# Patient Record
Sex: Male | Born: 1935 | Race: White | Hispanic: No | Marital: Married | Smoking: Never smoker
Health system: Southern US, Community
[De-identification: ages and names within clinical notes are randomized; demographics above are authoritative.]

## PROBLEM LIST (undated history)

## (undated) DIAGNOSIS — M549 Dorsalgia, unspecified: Secondary | ICD-10-CM

## (undated) DIAGNOSIS — I2510 Atherosclerotic heart disease of native coronary artery without angina pectoris: Secondary | ICD-10-CM

## (undated) DIAGNOSIS — N183 Chronic kidney disease, stage 3 unspecified: Secondary | ICD-10-CM

## (undated) DIAGNOSIS — R972 Elevated prostate specific antigen [PSA]: Secondary | ICD-10-CM

## (undated) DIAGNOSIS — E785 Hyperlipidemia, unspecified: Secondary | ICD-10-CM

## (undated) DIAGNOSIS — I10 Essential (primary) hypertension: Secondary | ICD-10-CM

## (undated) DIAGNOSIS — F419 Anxiety disorder, unspecified: Secondary | ICD-10-CM

## (undated) DIAGNOSIS — R3129 Other microscopic hematuria: Secondary | ICD-10-CM

## (undated) HISTORY — DX: Other microscopic hematuria: R31.29

## (undated) HISTORY — DX: Elevated prostate specific antigen (PSA): R97.20

## (undated) HISTORY — DX: Atherosclerotic heart disease of native coronary artery without angina pectoris: I25.10

## (undated) HISTORY — DX: Chronic kidney disease, stage 3 unspecified: N18.30

## (undated) HISTORY — PX: OTHER SURGICAL HISTORY: SHX169

## (undated) HISTORY — DX: Chronic kidney disease, stage 3 (moderate): N18.3

## (undated) HISTORY — DX: Hyperlipidemia, unspecified: E78.5

## (undated) HISTORY — DX: Essential (primary) hypertension: I10

---

## 1999-03-24 ENCOUNTER — Emergency Department (HOSPITAL_COMMUNITY): Admission: EM | Admit: 1999-03-24 | Discharge: 1999-03-24 | Payer: Self-pay | Admitting: Emergency Medicine

## 2010-08-05 ENCOUNTER — Ambulatory Visit: Payer: Self-pay | Admitting: Cardiology

## 2010-08-06 ENCOUNTER — Ambulatory Visit: Payer: Self-pay | Admitting: Cardiology

## 2010-12-10 ENCOUNTER — Ambulatory Visit: Payer: Self-pay | Admitting: Cardiology

## 2012-07-20 DIAGNOSIS — Z23 Encounter for immunization: Secondary | ICD-10-CM | POA: Diagnosis not present

## 2012-10-11 DIAGNOSIS — I2510 Atherosclerotic heart disease of native coronary artery without angina pectoris: Secondary | ICD-10-CM | POA: Diagnosis not present

## 2012-10-11 DIAGNOSIS — I10 Essential (primary) hypertension: Secondary | ICD-10-CM | POA: Diagnosis not present

## 2012-10-11 DIAGNOSIS — E785 Hyperlipidemia, unspecified: Secondary | ICD-10-CM | POA: Diagnosis not present

## 2012-10-11 DIAGNOSIS — R82998 Other abnormal findings in urine: Secondary | ICD-10-CM | POA: Diagnosis not present

## 2012-10-11 DIAGNOSIS — Z125 Encounter for screening for malignant neoplasm of prostate: Secondary | ICD-10-CM | POA: Diagnosis not present

## 2012-10-18 DIAGNOSIS — Z1212 Encounter for screening for malignant neoplasm of rectum: Secondary | ICD-10-CM | POA: Diagnosis not present

## 2012-10-18 DIAGNOSIS — I2510 Atherosclerotic heart disease of native coronary artery without angina pectoris: Secondary | ICD-10-CM | POA: Diagnosis not present

## 2012-10-18 DIAGNOSIS — E785 Hyperlipidemia, unspecified: Secondary | ICD-10-CM | POA: Diagnosis not present

## 2012-10-18 DIAGNOSIS — Z1331 Encounter for screening for depression: Secondary | ICD-10-CM | POA: Diagnosis not present

## 2012-10-18 DIAGNOSIS — Z0000 Encounter for general adult medical examination without abnormal findings: Secondary | ICD-10-CM | POA: Diagnosis not present

## 2012-10-18 DIAGNOSIS — I10 Essential (primary) hypertension: Secondary | ICD-10-CM | POA: Diagnosis not present

## 2012-10-21 DIAGNOSIS — D485 Neoplasm of uncertain behavior of skin: Secondary | ICD-10-CM | POA: Diagnosis not present

## 2012-10-21 DIAGNOSIS — D2330 Other benign neoplasm of skin of unspecified part of face: Secondary | ICD-10-CM | POA: Diagnosis not present

## 2012-10-21 DIAGNOSIS — L821 Other seborrheic keratosis: Secondary | ICD-10-CM | POA: Diagnosis not present

## 2012-10-21 DIAGNOSIS — L570 Actinic keratosis: Secondary | ICD-10-CM | POA: Diagnosis not present

## 2012-11-10 DIAGNOSIS — R972 Elevated prostate specific antigen [PSA]: Secondary | ICD-10-CM

## 2012-11-10 HISTORY — DX: Elevated prostate specific antigen (PSA): R97.20

## 2012-11-25 ENCOUNTER — Encounter: Payer: Self-pay | Admitting: Gastroenterology

## 2012-11-30 ENCOUNTER — Encounter: Payer: Self-pay | Admitting: Gastroenterology

## 2012-12-22 ENCOUNTER — Ambulatory Visit (AMBULATORY_SURGERY_CENTER): Payer: Medicare Other

## 2012-12-22 VITALS — Ht 67.5 in | Wt 160.0 lb

## 2012-12-22 DIAGNOSIS — Z1211 Encounter for screening for malignant neoplasm of colon: Secondary | ICD-10-CM

## 2012-12-22 MED ORDER — MOVIPREP 100 G PO SOLR: 1.0000 | Freq: Once | ORAL | Status: DC

## 2013-01-03 ENCOUNTER — Encounter: Payer: Self-pay | Admitting: Gastroenterology

## 2013-01-03 ENCOUNTER — Ambulatory Visit (AMBULATORY_SURGERY_CENTER): Payer: Medicare Other | Admitting: Gastroenterology

## 2013-01-03 VITALS — BP 124/61 | HR 57 | Temp 96.1°F | Resp 16 | Ht 67.0 in | Wt 160.0 lb

## 2013-01-03 DIAGNOSIS — Z1211 Encounter for screening for malignant neoplasm of colon: Secondary | ICD-10-CM

## 2013-01-03 DIAGNOSIS — E785 Hyperlipidemia, unspecified: Secondary | ICD-10-CM | POA: Diagnosis not present

## 2013-01-03 MED ORDER — SODIUM CHLORIDE 0.9 % IV SOLN: 500.0000 mL | INTRAVENOUS | Status: DC

## 2013-01-03 NOTE — Op Note (Signed)
[facility] [address] [place], [address]   COLONOSCOPY PROCEDURE REPORT  PATIENT: Dennis Carey, Dennis Carey  MR#: [id number] BIRTHDATE: February 25, 1935 , 77  yrs. old GENDER: Male ENDOSCOPIST: Mardella Layman, MD, [facility] REFERRED BY: PROCEDURE DATE:  01/03/2013 PROCEDURE:   Colonoscopy, screening ASA CLASS:   Class I INDICATIONS:Average risk patient for colon cancer. MEDICATIONS: propofol (Diprivan) 200mg  IV  DESCRIPTION OF PROCEDURE:   After the risks and benefits and of the procedure were explained, informed consent was obtained.  A digital rectal exam revealed no abnormalities of the rectum.    The LB [id number]  endoscope was introduced through the anus and advanced to the cecum, which was identified by both the appendix and ileocecal valve .  The quality of the prep was excellent, using MoviPrep .  The instrument was then slowly withdrawn as the colon was fully examined.     COLON FINDINGS: A normal appearing cecum, ileocecal valve, and appendiceal orifice were identified.  The ascending, hepatic flexure, transverse, splenic flexure, descending, sigmoid colon and rectum appeared unremarkable.  No polyps or cancers were seen. Retroflexed views revealed no abnormalities.     The scope was then withdrawn from the patient and the procedure completed.  COMPLICATIONS: There were no complications. ENDOSCOPIC IMPRESSION: Normal colon .Marland Kitchen..no polyps noted...  RECOMMENDATIONS: Given your age, you will not need another colonoscopy for colon cancer screening or polyp surveillance.  These types of tests usually stop around the age 18.   REPEAT EXAM:  NU:UVOZDG Eloise Harman, MD  _______________________________ eSigned:  Mardella Layman, MD, [facility] 01/03/2013 10:45 AM

## 2013-01-03 NOTE — Patient Instructions (Addendum)
YOU HAD AN ENDOSCOPIC PROCEDURE TODAY AT THE [facility]: Refer to the procedure report that was given to you for any specific questions about what was found during the examination.  If the procedure report does not answer your questions, please call your gastroenterologist to clarify.  If you requested that your care partner not be given the details of your procedure findings, then the procedure report has been included in a sealed envelope for you to review at your convenience later.  YOU SHOULD EXPECT: Some feelings of bloating in the abdomen. Passage of more gas than usual.  Walking can help get rid of the air that was put into your GI tract during the procedure and reduce the bloating. If you had a lower endoscopy (such as a colonoscopy or flexible sigmoidoscopy) you may notice spotting of blood in your stool or on the toilet paper. If you underwent a bowel prep for your procedure, then you may not have a normal bowel movement for a few days.  DIET: Your first meal following the procedure should be a light meal and then it is ok to progress to your normal diet.  A half-sandwich or bowl of soup is an example of a good first meal.  Heavy or fried foods are harder to digest and may make you feel nauseous or bloated.  Likewise meals heavy in dairy and vegetables can cause extra gas to form and this can also increase the bloating.  Drink plenty of fluids but you should avoid alcoholic beverages for 24 hours.  ACTIVITY: Your care partner should take you home directly after the procedure.  You should plan to take it easy, moving slowly for the rest of the day.  You can resume normal activity the day after the procedure however you should NOT DRIVE or use heavy machinery for 24 hours (because of the sedation medicines used during the test).    SYMPTOMS TO REPORT IMMEDIATELY: A gastroenterologist can be reached at any hour.  During normal business hours, 8:30 AM to 5:00 PM Monday through Friday,  call [phone].  After hours and on weekends, please call the GI answering service at [phone] who will take a message and have the physician on call contact you.   Following lower endoscopy (colonoscopy or flexible sigmoidoscopy):  Excessive amounts of blood in the stool  Significant tenderness or worsening of abdominal pains  Swelling of the abdomen that is new, acute  Fever of 100F or higher  Following upper endoscopy (EGD)  Vomiting of blood or coffee ground material  New chest pain or pain under the shoulder blades  Painful or persistently difficult swallowing  New shortness of breath  Fever of 100F or higher  Black, tarry-looking stools  FOLLOW UP: If any biopsies were taken you will be contacted by phone or by letter within the next 1-3 weeks.  Call your gastroenterologist if you have not heard about the biopsies in 3 weeks.  Our staff will call the home number listed on your records the next business day following your procedure to check on you and address any questions or concerns that you may have at that time regarding the information given to you following your procedure. This is a courtesy call and so if there is no answer at the home number and we have not heard from you through the emergency physician on call, we will assume that you have returned to your regular daily activities without incident.  SIGNATURES/CONFIDENTIALITY: You and/or your care   partner have signed paperwork which will be entered into your electronic medical record.  These signatures attest to the fact that that the information above on your After Visit Summary has been reviewed and is understood.  Full responsibility of the confidentiality of this discharge information lies with you and/or your care-partner.  

## 2013-01-03 NOTE — Progress Notes (Signed)
Patient did not experience any of the following events: a burn prior to discharge; a fall within the facility; wrong site/side/patient/procedure/implant event; or a hospital transfer or hospital admission upon discharge from the facility. (G8907) Patient did not have preoperative order for IV antibiotic SSI prophylaxis. (G8918)  

## 2013-01-04 ENCOUNTER — Telehealth: Payer: Self-pay | Admitting: *Deleted

## 2013-01-04 NOTE — Telephone Encounter (Signed)
  Follow up Call-  Call back number 01/03/2013  Post procedure Call Back phone  # [phone]  Permission to leave phone message Yes     Patient questions:  Do you have a fever, pain , or abdominal swelling? no Pain Score  0 *  Have you tolerated food without any problems? yes  Have you been able to return to your normal activities? yes  Do you have any questions about your discharge instructions: Diet   no Medications  no Follow up visit  no  Do you have questions or concerns about your Care? no  Actions: * If pain score is 4 or above: No action needed, pain <4.

## 2013-02-14 DIAGNOSIS — H5200 Hypermetropia, unspecified eye: Secondary | ICD-10-CM | POA: Diagnosis not present

## 2013-02-14 DIAGNOSIS — H524 Presbyopia: Secondary | ICD-10-CM | POA: Diagnosis not present

## 2013-02-14 DIAGNOSIS — H2510 Age-related nuclear cataract, unspecified eye: Secondary | ICD-10-CM | POA: Diagnosis not present

## 2013-04-28 DIAGNOSIS — L919 Hypertrophic disorder of the skin, unspecified: Secondary | ICD-10-CM | POA: Diagnosis not present

## 2013-04-28 DIAGNOSIS — L819 Disorder of pigmentation, unspecified: Secondary | ICD-10-CM | POA: Diagnosis not present

## 2013-04-28 DIAGNOSIS — L821 Other seborrheic keratosis: Secondary | ICD-10-CM | POA: Diagnosis not present

## 2013-04-28 DIAGNOSIS — L909 Atrophic disorder of skin, unspecified: Secondary | ICD-10-CM | POA: Diagnosis not present

## 2013-06-15 DIAGNOSIS — H43819 Vitreous degeneration, unspecified eye: Secondary | ICD-10-CM | POA: Diagnosis not present

## 2013-07-05 DIAGNOSIS — H43819 Vitreous degeneration, unspecified eye: Secondary | ICD-10-CM | POA: Diagnosis not present

## 2013-08-26 DIAGNOSIS — Z23 Encounter for immunization: Secondary | ICD-10-CM | POA: Diagnosis not present

## 2013-10-19 DIAGNOSIS — R7309 Other abnormal glucose: Secondary | ICD-10-CM | POA: Diagnosis not present

## 2013-10-19 DIAGNOSIS — Z125 Encounter for screening for malignant neoplasm of prostate: Secondary | ICD-10-CM | POA: Diagnosis not present

## 2013-10-19 DIAGNOSIS — I10 Essential (primary) hypertension: Secondary | ICD-10-CM | POA: Diagnosis not present

## 2013-10-19 DIAGNOSIS — E785 Hyperlipidemia, unspecified: Secondary | ICD-10-CM | POA: Diagnosis not present

## 2013-10-26 DIAGNOSIS — E785 Hyperlipidemia, unspecified: Secondary | ICD-10-CM | POA: Diagnosis not present

## 2013-10-26 DIAGNOSIS — Z1331 Encounter for screening for depression: Secondary | ICD-10-CM | POA: Diagnosis not present

## 2013-10-26 DIAGNOSIS — R972 Elevated prostate specific antigen [PSA]: Secondary | ICD-10-CM | POA: Diagnosis not present

## 2013-10-26 DIAGNOSIS — IMO0002 Reserved for concepts with insufficient information to code with codable children: Secondary | ICD-10-CM | POA: Diagnosis not present

## 2013-10-26 DIAGNOSIS — Z23 Encounter for immunization: Secondary | ICD-10-CM | POA: Diagnosis not present

## 2013-10-26 DIAGNOSIS — R7309 Other abnormal glucose: Secondary | ICD-10-CM | POA: Diagnosis not present

## 2013-10-26 DIAGNOSIS — I2510 Atherosclerotic heart disease of native coronary artery without angina pectoris: Secondary | ICD-10-CM | POA: Diagnosis not present

## 2014-04-26 DIAGNOSIS — T148 Other injury of unspecified body region: Secondary | ICD-10-CM | POA: Diagnosis not present

## 2014-04-26 DIAGNOSIS — L821 Other seborrheic keratosis: Secondary | ICD-10-CM | POA: Diagnosis not present

## 2014-04-26 DIAGNOSIS — L739 Follicular disorder, unspecified: Secondary | ICD-10-CM | POA: Diagnosis not present

## 2014-04-26 DIAGNOSIS — L570 Actinic keratosis: Secondary | ICD-10-CM | POA: Diagnosis not present

## 2014-04-26 DIAGNOSIS — D239 Other benign neoplasm of skin, unspecified: Secondary | ICD-10-CM | POA: Diagnosis not present

## 2014-06-02 DIAGNOSIS — J209 Acute bronchitis, unspecified: Secondary | ICD-10-CM | POA: Diagnosis not present

## 2014-06-02 DIAGNOSIS — N183 Chronic kidney disease, stage 3 unspecified: Secondary | ICD-10-CM | POA: Diagnosis not present

## 2014-06-02 DIAGNOSIS — R059 Cough, unspecified: Secondary | ICD-10-CM | POA: Diagnosis not present

## 2014-06-02 DIAGNOSIS — J0100 Acute maxillary sinusitis, unspecified: Secondary | ICD-10-CM | POA: Diagnosis not present

## 2014-06-02 DIAGNOSIS — R05 Cough: Secondary | ICD-10-CM | POA: Diagnosis not present

## 2014-06-02 DIAGNOSIS — IMO0002 Reserved for concepts with insufficient information to code with codable children: Secondary | ICD-10-CM | POA: Diagnosis not present

## 2014-06-02 DIAGNOSIS — I10 Essential (primary) hypertension: Secondary | ICD-10-CM | POA: Diagnosis not present

## 2014-07-21 DIAGNOSIS — H2510 Age-related nuclear cataract, unspecified eye: Secondary | ICD-10-CM | POA: Diagnosis not present

## 2014-07-21 DIAGNOSIS — H5200 Hypermetropia, unspecified eye: Secondary | ICD-10-CM | POA: Diagnosis not present

## 2014-07-31 DIAGNOSIS — Z23 Encounter for immunization: Secondary | ICD-10-CM | POA: Diagnosis not present

## 2014-11-08 DIAGNOSIS — E785 Hyperlipidemia, unspecified: Secondary | ICD-10-CM | POA: Diagnosis not present

## 2014-11-08 DIAGNOSIS — Z008 Encounter for other general examination: Secondary | ICD-10-CM | POA: Diagnosis not present

## 2014-11-08 DIAGNOSIS — I10 Essential (primary) hypertension: Secondary | ICD-10-CM | POA: Diagnosis not present

## 2014-11-08 DIAGNOSIS — Z125 Encounter for screening for malignant neoplasm of prostate: Secondary | ICD-10-CM | POA: Diagnosis not present

## 2014-11-15 DIAGNOSIS — Z0000 Encounter for general adult medical examination without abnormal findings: Secondary | ICD-10-CM | POA: Diagnosis not present

## 2014-11-15 DIAGNOSIS — Z6824 Body mass index (BMI) 24.0-24.9, adult: Secondary | ICD-10-CM | POA: Diagnosis not present

## 2014-11-15 DIAGNOSIS — R7301 Impaired fasting glucose: Secondary | ICD-10-CM | POA: Diagnosis not present

## 2014-11-15 DIAGNOSIS — I10 Essential (primary) hypertension: Secondary | ICD-10-CM | POA: Diagnosis not present

## 2014-11-15 DIAGNOSIS — N183 Chronic kidney disease, stage 3 (moderate): Secondary | ICD-10-CM | POA: Diagnosis not present

## 2014-11-15 DIAGNOSIS — E785 Hyperlipidemia, unspecified: Secondary | ICD-10-CM | POA: Diagnosis not present

## 2014-11-15 DIAGNOSIS — R972 Elevated prostate specific antigen [PSA]: Secondary | ICD-10-CM | POA: Diagnosis not present

## 2014-11-15 DIAGNOSIS — Z79899 Other long term (current) drug therapy: Secondary | ICD-10-CM | POA: Diagnosis not present

## 2014-11-15 DIAGNOSIS — Z1389 Encounter for screening for other disorder: Secondary | ICD-10-CM | POA: Diagnosis not present

## 2014-11-15 DIAGNOSIS — I2510 Atherosclerotic heart disease of native coronary artery without angina pectoris: Secondary | ICD-10-CM | POA: Diagnosis not present

## 2014-11-15 DIAGNOSIS — Z23 Encounter for immunization: Secondary | ICD-10-CM | POA: Diagnosis not present

## 2014-11-16 DIAGNOSIS — Z1212 Encounter for screening for malignant neoplasm of rectum: Secondary | ICD-10-CM | POA: Diagnosis not present

## 2015-05-01 DIAGNOSIS — D2262 Melanocytic nevi of left upper limb, including shoulder: Secondary | ICD-10-CM | POA: Diagnosis not present

## 2015-05-01 DIAGNOSIS — L570 Actinic keratosis: Secondary | ICD-10-CM | POA: Diagnosis not present

## 2015-05-01 DIAGNOSIS — L820 Inflamed seborrheic keratosis: Secondary | ICD-10-CM | POA: Diagnosis not present

## 2015-05-01 DIAGNOSIS — D2261 Melanocytic nevi of right upper limb, including shoulder: Secondary | ICD-10-CM | POA: Diagnosis not present

## 2015-05-01 DIAGNOSIS — L821 Other seborrheic keratosis: Secondary | ICD-10-CM | POA: Diagnosis not present

## 2015-05-01 DIAGNOSIS — D225 Melanocytic nevi of trunk: Secondary | ICD-10-CM | POA: Diagnosis not present

## 2015-07-11 DIAGNOSIS — Z23 Encounter for immunization: Secondary | ICD-10-CM | POA: Diagnosis not present

## 2015-07-27 DIAGNOSIS — H2513 Age-related nuclear cataract, bilateral: Secondary | ICD-10-CM | POA: Diagnosis not present

## 2015-07-27 DIAGNOSIS — H5203 Hypermetropia, bilateral: Secondary | ICD-10-CM | POA: Diagnosis not present

## 2015-12-24 DIAGNOSIS — Z125 Encounter for screening for malignant neoplasm of prostate: Secondary | ICD-10-CM | POA: Diagnosis not present

## 2015-12-24 DIAGNOSIS — E784 Other hyperlipidemia: Secondary | ICD-10-CM | POA: Diagnosis not present

## 2015-12-24 DIAGNOSIS — N183 Chronic kidney disease, stage 3 (moderate): Secondary | ICD-10-CM | POA: Diagnosis not present

## 2015-12-24 DIAGNOSIS — R7301 Impaired fasting glucose: Secondary | ICD-10-CM | POA: Diagnosis not present

## 2015-12-31 DIAGNOSIS — R972 Elevated prostate specific antigen [PSA]: Secondary | ICD-10-CM | POA: Diagnosis not present

## 2015-12-31 DIAGNOSIS — N183 Chronic kidney disease, stage 3 (moderate): Secondary | ICD-10-CM | POA: Diagnosis not present

## 2015-12-31 DIAGNOSIS — Z0000 Encounter for general adult medical examination without abnormal findings: Secondary | ICD-10-CM | POA: Diagnosis not present

## 2015-12-31 DIAGNOSIS — Z1389 Encounter for screening for other disorder: Secondary | ICD-10-CM | POA: Diagnosis not present

## 2015-12-31 DIAGNOSIS — E784 Other hyperlipidemia: Secondary | ICD-10-CM | POA: Diagnosis not present

## 2015-12-31 DIAGNOSIS — R7301 Impaired fasting glucose: Secondary | ICD-10-CM | POA: Diagnosis not present

## 2015-12-31 DIAGNOSIS — I10 Essential (primary) hypertension: Secondary | ICD-10-CM | POA: Diagnosis not present

## 2015-12-31 DIAGNOSIS — Z6824 Body mass index (BMI) 24.0-24.9, adult: Secondary | ICD-10-CM | POA: Diagnosis not present

## 2015-12-31 DIAGNOSIS — I2510 Atherosclerotic heart disease of native coronary artery without angina pectoris: Secondary | ICD-10-CM | POA: Diagnosis not present

## 2016-07-07 DIAGNOSIS — L218 Other seborrheic dermatitis: Secondary | ICD-10-CM | POA: Diagnosis not present

## 2016-07-07 DIAGNOSIS — L570 Actinic keratosis: Secondary | ICD-10-CM | POA: Diagnosis not present

## 2016-07-07 DIAGNOSIS — D2262 Melanocytic nevi of left upper limb, including shoulder: Secondary | ICD-10-CM | POA: Diagnosis not present

## 2016-07-07 DIAGNOSIS — D2261 Melanocytic nevi of right upper limb, including shoulder: Secondary | ICD-10-CM | POA: Diagnosis not present

## 2016-07-07 DIAGNOSIS — L821 Other seborrheic keratosis: Secondary | ICD-10-CM | POA: Diagnosis not present

## 2016-07-26 DIAGNOSIS — Z23 Encounter for immunization: Secondary | ICD-10-CM | POA: Diagnosis not present

## 2016-07-29 DIAGNOSIS — H5203 Hypermetropia, bilateral: Secondary | ICD-10-CM | POA: Diagnosis not present

## 2016-07-29 DIAGNOSIS — H0012 Chalazion right lower eyelid: Secondary | ICD-10-CM | POA: Diagnosis not present

## 2016-07-29 DIAGNOSIS — H2513 Age-related nuclear cataract, bilateral: Secondary | ICD-10-CM | POA: Diagnosis not present

## 2017-01-01 DIAGNOSIS — Z0000 Encounter for general adult medical examination without abnormal findings: Secondary | ICD-10-CM | POA: Diagnosis not present

## 2017-01-01 DIAGNOSIS — Z125 Encounter for screening for malignant neoplasm of prostate: Secondary | ICD-10-CM | POA: Diagnosis not present

## 2017-01-01 DIAGNOSIS — E784 Other hyperlipidemia: Secondary | ICD-10-CM | POA: Diagnosis not present

## 2017-01-01 DIAGNOSIS — R7301 Impaired fasting glucose: Secondary | ICD-10-CM | POA: Diagnosis not present

## 2017-01-01 DIAGNOSIS — N183 Chronic kidney disease, stage 3 (moderate): Secondary | ICD-10-CM | POA: Diagnosis not present

## 2017-01-08 DIAGNOSIS — I10 Essential (primary) hypertension: Secondary | ICD-10-CM | POA: Diagnosis not present

## 2017-01-08 DIAGNOSIS — E784 Other hyperlipidemia: Secondary | ICD-10-CM | POA: Diagnosis not present

## 2017-01-08 DIAGNOSIS — R7301 Impaired fasting glucose: Secondary | ICD-10-CM | POA: Diagnosis not present

## 2017-01-08 DIAGNOSIS — N183 Chronic kidney disease, stage 3 (moderate): Secondary | ICD-10-CM | POA: Diagnosis not present

## 2017-01-08 DIAGNOSIS — I2510 Atherosclerotic heart disease of native coronary artery without angina pectoris: Secondary | ICD-10-CM | POA: Diagnosis not present

## 2017-01-08 DIAGNOSIS — R972 Elevated prostate specific antigen [PSA]: Secondary | ICD-10-CM | POA: Diagnosis not present

## 2017-01-08 DIAGNOSIS — Z6824 Body mass index (BMI) 24.0-24.9, adult: Secondary | ICD-10-CM | POA: Diagnosis not present

## 2017-01-08 DIAGNOSIS — Z1389 Encounter for screening for other disorder: Secondary | ICD-10-CM | POA: Diagnosis not present

## 2017-01-08 DIAGNOSIS — Z0000 Encounter for general adult medical examination without abnormal findings: Secondary | ICD-10-CM | POA: Diagnosis not present

## 2017-08-17 DIAGNOSIS — H2513 Age-related nuclear cataract, bilateral: Secondary | ICD-10-CM | POA: Diagnosis not present

## 2017-08-17 DIAGNOSIS — H5203 Hypermetropia, bilateral: Secondary | ICD-10-CM | POA: Diagnosis not present

## 2017-09-08 DIAGNOSIS — L853 Xerosis cutis: Secondary | ICD-10-CM | POA: Diagnosis not present

## 2017-09-08 DIAGNOSIS — L821 Other seborrheic keratosis: Secondary | ICD-10-CM | POA: Diagnosis not present

## 2017-09-08 DIAGNOSIS — D2261 Melanocytic nevi of right upper limb, including shoulder: Secondary | ICD-10-CM | POA: Diagnosis not present

## 2017-09-08 DIAGNOSIS — L814 Other melanin hyperpigmentation: Secondary | ICD-10-CM | POA: Diagnosis not present

## 2017-09-08 DIAGNOSIS — D1801 Hemangioma of skin and subcutaneous tissue: Secondary | ICD-10-CM | POA: Diagnosis not present

## 2017-09-16 DIAGNOSIS — Z23 Encounter for immunization: Secondary | ICD-10-CM | POA: Diagnosis not present

## 2018-01-04 DIAGNOSIS — I10 Essential (primary) hypertension: Secondary | ICD-10-CM | POA: Diagnosis not present

## 2018-01-04 DIAGNOSIS — R82998 Other abnormal findings in urine: Secondary | ICD-10-CM | POA: Diagnosis not present

## 2018-01-04 DIAGNOSIS — R7301 Impaired fasting glucose: Secondary | ICD-10-CM | POA: Diagnosis not present

## 2018-01-04 DIAGNOSIS — Z125 Encounter for screening for malignant neoplasm of prostate: Secondary | ICD-10-CM | POA: Diagnosis not present

## 2018-01-04 DIAGNOSIS — E7849 Other hyperlipidemia: Secondary | ICD-10-CM | POA: Diagnosis not present

## 2018-01-11 DIAGNOSIS — E7849 Other hyperlipidemia: Secondary | ICD-10-CM | POA: Diagnosis not present

## 2018-01-11 DIAGNOSIS — R7301 Impaired fasting glucose: Secondary | ICD-10-CM | POA: Diagnosis not present

## 2018-01-11 DIAGNOSIS — Z1389 Encounter for screening for other disorder: Secondary | ICD-10-CM | POA: Diagnosis not present

## 2018-01-11 DIAGNOSIS — N183 Chronic kidney disease, stage 3 (moderate): Secondary | ICD-10-CM | POA: Diagnosis not present

## 2018-01-11 DIAGNOSIS — I2510 Atherosclerotic heart disease of native coronary artery without angina pectoris: Secondary | ICD-10-CM | POA: Diagnosis not present

## 2018-01-11 DIAGNOSIS — Z6824 Body mass index (BMI) 24.0-24.9, adult: Secondary | ICD-10-CM | POA: Diagnosis not present

## 2018-01-11 DIAGNOSIS — I10 Essential (primary) hypertension: Secondary | ICD-10-CM | POA: Diagnosis not present

## 2018-01-11 DIAGNOSIS — Z0000 Encounter for general adult medical examination without abnormal findings: Secondary | ICD-10-CM | POA: Diagnosis not present

## 2018-01-11 DIAGNOSIS — R972 Elevated prostate specific antigen [PSA]: Secondary | ICD-10-CM | POA: Diagnosis not present

## 2018-01-13 DIAGNOSIS — Z1212 Encounter for screening for malignant neoplasm of rectum: Secondary | ICD-10-CM | POA: Diagnosis not present

## 2018-05-17 DIAGNOSIS — Z6825 Body mass index (BMI) 25.0-25.9, adult: Secondary | ICD-10-CM | POA: Diagnosis not present

## 2018-05-17 DIAGNOSIS — M542 Cervicalgia: Secondary | ICD-10-CM | POA: Diagnosis not present

## 2018-05-17 DIAGNOSIS — I2510 Atherosclerotic heart disease of native coronary artery without angina pectoris: Secondary | ICD-10-CM | POA: Diagnosis not present

## 2018-05-17 DIAGNOSIS — I10 Essential (primary) hypertension: Secondary | ICD-10-CM | POA: Diagnosis not present

## 2018-05-25 ENCOUNTER — Other Ambulatory Visit: Payer: Self-pay | Admitting: Internal Medicine

## 2018-05-25 ENCOUNTER — Ambulatory Visit
Admission: RE | Admit: 2018-05-25 | Discharge: 2018-05-25 | Disposition: A | Discharge status: Home / Self Care | Payer: Medicare Other | Source: Ambulatory Visit | Attending: Internal Medicine | Admitting: Internal Medicine

## 2018-05-25 DIAGNOSIS — R519 Headache, unspecified: Secondary | ICD-10-CM

## 2018-05-25 DIAGNOSIS — R51 Headache: Principal | ICD-10-CM

## 2018-07-16 DIAGNOSIS — N183 Chronic kidney disease, stage 3 (moderate): Secondary | ICD-10-CM | POA: Diagnosis not present

## 2018-07-16 DIAGNOSIS — Z6824 Body mass index (BMI) 24.0-24.9, adult: Secondary | ICD-10-CM | POA: Diagnosis not present

## 2018-07-16 DIAGNOSIS — I10 Essential (primary) hypertension: Secondary | ICD-10-CM | POA: Diagnosis not present

## 2018-07-16 DIAGNOSIS — R51 Headache: Secondary | ICD-10-CM | POA: Diagnosis not present

## 2018-07-17 DIAGNOSIS — Z23 Encounter for immunization: Secondary | ICD-10-CM | POA: Diagnosis not present

## 2018-07-19 ENCOUNTER — Other Ambulatory Visit: Payer: Self-pay | Admitting: Registered Nurse

## 2018-07-19 DIAGNOSIS — R519 Headache, unspecified: Secondary | ICD-10-CM

## 2018-07-19 DIAGNOSIS — R51 Headache: Principal | ICD-10-CM

## 2018-07-27 ENCOUNTER — Ambulatory Visit
Admission: RE | Admit: 2018-07-27 | Discharge: 2018-07-27 | Disposition: A | Discharge status: Home / Self Care | Payer: Medicare Other | Source: Ambulatory Visit | Attending: Registered Nurse | Admitting: Registered Nurse

## 2018-07-27 DIAGNOSIS — R51 Headache: Secondary | ICD-10-CM | POA: Diagnosis not present

## 2018-07-27 DIAGNOSIS — R519 Headache, unspecified: Secondary | ICD-10-CM

## 2018-07-27 MED ORDER — GADOBENATE DIMEGLUMINE 529 MG/ML IV SOLN
7.0000 mL | Freq: Once | INTRAVENOUS | Status: AC | PRN
  Administered 2018-07-27: 7 mL via INTRAVENOUS

## 2018-08-23 DIAGNOSIS — H2513 Age-related nuclear cataract, bilateral: Secondary | ICD-10-CM | POA: Diagnosis not present

## 2018-08-23 DIAGNOSIS — H5203 Hypermetropia, bilateral: Secondary | ICD-10-CM | POA: Diagnosis not present

## 2018-09-08 ENCOUNTER — Encounter: Payer: Self-pay | Admitting: Neurology

## 2018-09-08 ENCOUNTER — Ambulatory Visit (INDEPENDENT_AMBULATORY_CARE_PROVIDER_SITE_OTHER): Payer: Medicare Other | Admitting: Neurology

## 2018-09-08 ENCOUNTER — Encounter

## 2018-09-08 VITALS — BP 152/74 | HR 74 | Ht 67.0 in | Wt 155.0 lb

## 2018-09-08 DIAGNOSIS — G8929 Other chronic pain: Secondary | ICD-10-CM | POA: Diagnosis not present

## 2018-09-08 DIAGNOSIS — M542 Cervicalgia: Secondary | ICD-10-CM

## 2018-09-08 DIAGNOSIS — M5412 Radiculopathy, cervical region: Secondary | ICD-10-CM

## 2018-09-08 DIAGNOSIS — M4722 Other spondylosis with radiculopathy, cervical region: Secondary | ICD-10-CM

## 2018-09-08 DIAGNOSIS — M5481 Occipital neuralgia: Secondary | ICD-10-CM

## 2018-09-08 NOTE — Progress Notes (Signed)
Nerve block w/o steroid: Pt signed consent  0.5% Bupivocaine 6 mL [id number] EXP:09/22 [id number]  2% Lidocaine 6 mL LOT: [id number] EXP: 02/0/2021 [id number]

## 2018-09-08 NOTE — Progress Notes (Signed)
[facility]    Provider:  Dr Jaynee Eagles Referring Provider: [name], MD Primary Care Physician:  [name], MD  CC:  Posterior head pain  HPI:  Dennis Carey is a 83 y.o. male here as requested by Dr. Philip Aspen for headache.PMHx HLD, HTN, CKD, CAD, Cervical spine degenerative changes  In July he was on a big tractor, it was a rough area, he got "jostled" and did it several times in a row and noticed pain on the left side of the head in the back (points to the occipital nerve on the left). 8/10, steady pain, one spot, he was taking 2 advil and a tylenol which didn't help. Ice pack helped. No rash. Steroids were started. Vicadin helped but he had side effects. Steroids did not help. Then he had a dull aching pain and scalp tenderness in the back of the head. Headache never went away. MRI showed degenerative changes at c3-c4. Comes and goes but daily. He doesn't take medication for it. No migrainous symptoms.   Reviewed notes, labs and imaging from outside physicians, which showed:  Reviewed MRI of the brain results the patient brings today on CD also online from [facility] imaging.  Really unremarkable brain other than mild generalized atrophy and some very mild small vessel chronic ischemic changes normal for aging.  Normal aging brain.  Also normal intracranial MRA.  Reviewed referring notes from Dr. Brigitte Pulse.  He has had headaches since July 3.  Pain was localized posteriorly after being on a tractor for 3 hours.  Symptoms are described as constant generalized with scalp tenderness.  He is never experienced headaches before.  Posterior headache lasted for 2 weeks and resolved now left with mild generalized headache.  He is tried Advil Tylenol heating pads.  Has not taken anything for the headaches in 4 weeks.  He walks with mild C-spine kyphosis.  Otherwise his exam is normal.  Cranial abnormality versus neck pain.  Low suspicion for acute neuro cause.  Neuro exam  completely benign.  MRI/MRA unremarkable.  He declines medications.  Review of Systems: Patient complains of symptoms per HPI as well as the following symptoms: headache. Pertinent negatives and positives per HPI. All others negative.   Social History   Socioeconomic History  . Marital status: Married    Spouse name: Not on file  . Number of children: 3  . Years of education: MD   . Highest education level: Professional school degree (e.g., MD, DDS, DVM, JD)  Occupational History  . Occupation: retired Psychologist, sport and exercise  Social Needs  . Financial resource strain: Not on file  . Food insecurity:    Worry: Not on file    Inability: Not on file  . Transportation needs:    Medical: Not on file    Non-medical: Not on file  Tobacco Use  . Smoking status: Never Smoker  . Smokeless tobacco: Never Used  Substance and Sexual Activity  . Alcohol use: Yes    Comment: occasional wine   . Drug use: Never  . Sexual activity: Not on file  Lifestyle  . Physical activity:    Days per week: Not on file    Minutes per session: Not on file  . Stress: Not on file  Relationships  . Social connections:    Talks on phone: Not on file    Gets together: Not on file    Attends religious service: Not on file    Active member of club or organization: Not on file  Attends meetings of clubs or organizations: Not on file    Relationship status: Not on file  . Intimate partner violence:    Fear of current or ex partner: Not on file    Emotionally abused: Not on file    Physically abused: Not on file    Forced sexual activity: Not on file  Other Topics Concern  . Not on file  Social History Narrative   Lives at home with his wife Flora    Right handed   Caffeine: daily soda     Family History  Problem Relation Age of Onset  . Colon cancer Neg Hx   . Migraines Neg Hx     Past Medical History:  Diagnosis Date  . CAD (coronary artery disease)    significant CAD by cardiac calcium scoring test   . CKD (chronic kidney disease), stage III ([place])    by serum creatinine is stable  . HTN (hypertension)    pt denies   . Hyperlipidemia   . Microhematuria   . PSA elevation 2014   4.75 February 2017, 6.9 in 2/18 and declines urologist eval or trial of antibiotics    Past Surgical History:  Procedure Laterality Date  . dental implants     3 of them 2008  . submucous resection nose     1956    Current Outpatient Medications  Medication Sig Dispense Refill  . aspirin 81 MG tablet Take 81 mg by mouth daily.    Marland Kitchen ezetimibe (ZETIA) 10 MG tablet Take 10 mg by mouth daily.    . rosuvastatin (CRESTOR) 20 MG tablet Take 20 mg by mouth daily.     No current facility-administered medications for this visit.     Allergies as of 09/08/2018 - Review Complete 09/08/2018  Allergen Reaction Noted  . Sulfa antibiotics Hives 12/22/2012    Vitals: BP (!) 152/74 (BP Location: Left Arm, Patient Position: Sitting)   Pulse 74   Ht 5\' 7"  (1.702 m)   Wt 155 lb (70.3 kg)   BMI 24.28 kg/m  Last Weight:  Wt Readings from Last 1 Encounters:  09/08/18 155 lb (70.3 kg)   Last Height:   Ht Readings from Last 1 Encounters:  09/08/18 5\' 7"  (1.702 m)   Physical exam: Exam: Gen: NAD, conversant, well nourised, well groomed                     CV: RRR, no MRG. No Carotid Bruits. No peripheral edema, warm, nontender Eyes: Conjunctivae clear without exudates or hemorrhage  Neuro: Detailed Neurologic Exam  Speech:    Speech is normal; fluent and spontaneous with normal comprehension.  Cognition:    The patient is oriented to person, place, and time;     recent and remote memory intact;     language fluent;     normal attention, concentration,     fund of knowledge Cranial Nerves:    The pupils are equal, round, and reactive to light. The fundi are normal and spontaneous venous pulsations are present. Visual fields are full to finger confrontation. Extraocular movements are intact. Trigeminal  sensation is intact and the muscles of mastication are normal. The face is symmetric. The palate elevates in the midline. Hearing intact. Voice is normal. Shoulder shrug is normal. The tongue has normal motion without fasciculations.   Coordination:    Normal finger to nose and heel to shin. Normal rapid alternating movements.   Gait:    Heel-toe and tandem  gait are normal.   Motor Observation:    No asymmetry, no atrophy, and no involuntary movements noted. Tone:    Normal muscle tone.    Posture:    Posture is normal. normal erect    Strength:    Strength is V/V in the upper and lower limbs.      Sensation: intact to LT     Reflex Exam:  DTR's:    Deep tendon reflexes in the upper and lower extremities are normal bilaterally.   Toes:    The toes are downgoing bilaterally.   Clonus:    Clonus is absent.       Assessment/Plan:  Dennis Carey is a 83 y.o. male here as requested by Dr. Philip Aspen for headache.PMHx HLD, HTN, CKD, CAD, Cervical spine degenerative changes. In July he was on a big tractor, it was a rough area, he got "jostled" several days in a row and noticed pain on the left side of the head in the back (points to the occipital nerve on the left). This has progressed to a generalized headache. 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   Cervico-occipital neuralgia: Performed nerve blocks today, pain reduced from 4/10 to 0/10 Discussed if pain returns we can repeat nerve blocks or send him to Dr. Laurence Spates for Ablative procedures to denervate the occipital nerve (greater or lesser), upper cervical nerve (e.g. second cervical nerve also known as C2). MRI of the cervical spine to evaluate for degenerative changes, nerve root impingement to plan for ESI. Ablative procedures or other interventions.  Orders Placed This Encounter  Procedures  . MR CERVICAL SPINE WO CONTRAST   Performed by Dr. Jaynee Eagles M.D. All procedures a documented blood were medically necessary,  reasonable and appropriate based on the patient's history, medical diagnosis and physician opinion. Verbal informed consent was obtained from the patient, patient was informed of potential risk of procedure, including bruising, bleeding, hematoma formation, infection, muscle weakness, muscle pain, numbness, transient hypertension, transient hyperglycemia and transient insomnia among others. All areas injected were topically clean with isopropyl rubbing alcohol. Nonsterile nonlatex gloves were worn during the procedure.  1. Greater occipital nerve block [phone]). The greater occipital nerve site was identified at the nuchal line medial to the occipital artery. Medication was injected into the left and right occipital nerve areas and suboccipital areas. Patient's condition is associated with inflammation of the greater occipital nerve and associated multiple groups. Injection was deemed medically necessary, reasonable and appropriate. Injection represents a separate and unique surgical service.  2. Lesser occipital nerve block [phone]). The lesser occipital nerve site was identified approximately 2 cm lateral to the greater occipital nerve. Occasion was injected into the left and right occipital nerve areas. Patient's condition is associated with inflammation of the lesser occipital nerve and associated muscle groups. Injection was deemed medically necessary, reasonable and appropriate. Injection represents a separate and unique surgical service.   3. Auriculotemporal nerve block ([id number]): The Auriculotemporal nerve site was identified along the posterior margin of the sternocleidomastoid muscle toward the base of the ear. Medication was injected into the left and right radicular temporal nerve areas. Patient's condition is associated with inflammation of the Auriculotemporal Nerve and associated muscle groups. Injection was deemed medically necessary, reasonable and appropriate. Injection represents a separate  and unique surgical service.   Discussed: To prevent or relieve headaches, try the following: Cool Compress. Lie down and place a cool compress on your head.  Avoid headache triggers. If certain foods or odors seem to have triggered  your migraines in the past, avoid them. A headache diary might help you identify triggers.  Include physical activity in your daily routine. Try a daily walk or other moderate aerobic exercise.  Manage stress. Find healthy ways to cope with the stressors, such as delegating tasks on your to-do list.  Practice relaxation techniques. Try deep breathing, yoga, massage and visualization.  Eat regularly. Eating regularly scheduled meals and maintaining a healthy diet might help prevent headaches. Also, drink plenty of fluids.  Follow a regular sleep schedule. Sleep deprivation might contribute to headaches Consider biofeedback. With this mind-body technique, you learn to control certain bodily functions - such as muscle tension, heart rate and blood pressure - to prevent headaches or reduce headache pain.    Proceed to emergency room if you experience new or worsening symptoms or symptoms do not resolve, if you have new neurologic symptoms or if headache is severe, or for any concerning symptom.   Sarina Ill, MD  [facility] [address] [place], [place] [address]  Phone [phone] Fax [phone]

## 2018-09-08 NOTE — Patient Instructions (Signed)
Differential includes pain in the upper cervical joints or disks, suboccipital or upper posterior neck muscles including the traps/scm, spinal and posterior cranial fossa dura mater. Ablative procedures may be performed in attempt to denervate the occipital nerve (greater or lesser), upper cervical nerve (e.g. second cervical nerve also known as C2). The proposed goal of denervation is to "shut off" the pain signals that are sent to the brain from the joints and nerves. An additional purported objective is to reduce the likelihood of, or to delay, any recurrence that may occur by selectively destroying pain fibers without causing excessive sensory loss, motor dysfunction or other complications. Ablative treatments for occipital neuralgia, chronic headaches (including but not limited to cervicogenic headaches, migraines, cluster headaches, tension headaches) and persistent idiopathic facial pain (atypical facial pain) have been proposed for pain relief.  Occipital Nerve Block Patient Information  Description: The occipital nerves originate in the cervical (neck) spinal cord and travel upward through muscle and tissue to supply sensation to the back of the head and top of the scalp.  In addition, the nerves control some of the muscles of the scalp.  Occipital neuralgia is an irritation of these nerves which can cause headaches, numbness of the scalp, and neck discomfort.     The occipital nerve block will interrupt nerve transmission through these nerves and can relieve pain and spasm.  The block consists of insertion of a small needle under the skin in the back of the head to deposit local anesthetic (numbing medicine) and/or steroids around the nerve.  The entire block usually lasts less than 5 minutes.  Conditions which may be treated by occipital blocks:   Muscular pain and spasm of the scalp  Nerve irritation, back of the head  Headaches  Upper neck pain  Preparation for the  injection:  1. Do not eat any solid food or dairy products within 8 hours of your appointment. 2. You may drink clear liquids up to 3 hours before appointment.  Clear liquids include water, black coffee, juice or soda.  No milk or cream please. 3. You may take your regular medication, including pain medications, with a sip of water before you appointment.  Diabetics should hold regular insulin (if taken separately) and take 1/2 normal NPH dose the morning of the procedure.  Carry some sugar containing items with you to your appointment. 4. A driver must accompany you and be prepared to drive you home after your procedure. 5. Bring all your current medications with you. 6. An IV may be inserted and sedation may be given at the discretion of the physician. 7. A blood pressure cuff, EKG, and other monitors will often be applied during the procedure.  Some patients may need to have extra oxygen administered for a short period. 8. You will be asked to provide medical information, including your allergies and medications, prior to the procedure.  We must know immediately if you are taking blood thinners (like Coumadin/Warfarin) or if you are allergic to IV iodine contrast (dye).  We must know if you could possible be pregnant.  9. Do not wear a high collared shirt or turtleneck.  Tie long hair up in the back if possible.  Possible side-effects:   Bleeding from needle site  Infection (rare, may require surgery)  Nerve injury (rare)  Hair on back of neck can be tinged with iodine scrub (this will wash out)  Light-headedness (temporary)  Pain at injection site (several days)  Decreased blood pressure (rare, temporary)  Seizure (very rare)  Call if you experience:   Hives or difficulty breathing ( go to the emergency room)  Inflammation or drainage at the injection site(s)  Please note:  Although the local anesthetic injected can often make your painful muscles or headache feel good for  several hours after the injection, the pain may return.  It takes 3-7 days for steroids to work.  You may not notice any pain relief for at least one week.  If effective, we will often do a series of injections spaced 3-6 weeks apart to maximally decrease your pain.  If you have any questions, please call [phone] [facility]

## 2018-09-09 ENCOUNTER — Telehealth: Payer: Self-pay | Admitting: Neurology

## 2018-09-09 NOTE — Telephone Encounter (Signed)
Medicare/BCBS supp order sent to GI. No auth they will reach out to the pt to schedule.  °

## 2018-09-09 NOTE — Telephone Encounter (Signed)
Spoke to the patient he is aware of this. I informed him to call GI if he has not heard in the next 2-3 business days at [phone].

## 2018-09-23 ENCOUNTER — Ambulatory Visit
Admission: RE | Admit: 2018-09-23 | Discharge: 2018-09-23 | Disposition: A | Discharge status: Home / Self Care | Payer: Medicare Other | Source: Ambulatory Visit | Attending: Neurology | Admitting: Neurology

## 2018-09-23 DIAGNOSIS — M5481 Occipital neuralgia: Secondary | ICD-10-CM

## 2018-09-23 DIAGNOSIS — M4722 Other spondylosis with radiculopathy, cervical region: Secondary | ICD-10-CM

## 2018-09-23 DIAGNOSIS — M5412 Radiculopathy, cervical region: Secondary | ICD-10-CM | POA: Diagnosis not present

## 2018-09-23 DIAGNOSIS — G8929 Other chronic pain: Secondary | ICD-10-CM

## 2018-09-23 DIAGNOSIS — M542 Cervicalgia: Secondary | ICD-10-CM

## 2018-09-29 ENCOUNTER — Telehealth: Payer: Self-pay | Admitting: *Deleted

## 2018-09-29 NOTE — Telephone Encounter (Signed)
-----   Message from Melvenia Beam, MD sent at 09/27/2018 10:25 AM EST ----- THE MRI of his neck shows multi-level degenerative changes. An effusion is noted in the atlantoaxial joint (c1-c2) and this can be causing his pain. I would like him to see neurosurgery to evaluate his cervical spine as this could very likely be the problem, there is a lot of arthritic changes throughout and that effusion at the base of the neck. If he is ok please refer thanks

## 2018-09-29 NOTE — Telephone Encounter (Signed)
I called and spoke to his wife (on [place]).  The patient was not at home but his wife provided me with his cell number (# added to chart).

## 2018-09-29 NOTE — Telephone Encounter (Signed)
Spoke to patient - he is aware of his cervical MRI results and verbalized understanding.  States his pain is now 98% improved.  He declined the offered neurosurgery referral.  He will call back if he changes his mind.

## 2018-10-12 DIAGNOSIS — L820 Inflamed seborrheic keratosis: Secondary | ICD-10-CM | POA: Diagnosis not present

## 2018-10-12 DIAGNOSIS — L821 Other seborrheic keratosis: Secondary | ICD-10-CM | POA: Diagnosis not present

## 2018-10-12 DIAGNOSIS — L853 Xerosis cutis: Secondary | ICD-10-CM | POA: Diagnosis not present

## 2018-10-12 DIAGNOSIS — D2261 Melanocytic nevi of right upper limb, including shoulder: Secondary | ICD-10-CM | POA: Diagnosis not present

## 2019-01-07 DIAGNOSIS — R7301 Impaired fasting glucose: Secondary | ICD-10-CM | POA: Diagnosis not present

## 2019-01-07 DIAGNOSIS — N183 Chronic kidney disease, stage 3 (moderate): Secondary | ICD-10-CM | POA: Diagnosis not present

## 2019-01-07 DIAGNOSIS — E7849 Other hyperlipidemia: Secondary | ICD-10-CM | POA: Diagnosis not present

## 2019-01-07 DIAGNOSIS — Z125 Encounter for screening for malignant neoplasm of prostate: Secondary | ICD-10-CM | POA: Diagnosis not present

## 2019-01-07 DIAGNOSIS — R82998 Other abnormal findings in urine: Secondary | ICD-10-CM | POA: Diagnosis not present

## 2019-01-14 DIAGNOSIS — I10 Essential (primary) hypertension: Secondary | ICD-10-CM | POA: Diagnosis not present

## 2019-01-14 DIAGNOSIS — I2510 Atherosclerotic heart disease of native coronary artery without angina pectoris: Secondary | ICD-10-CM | POA: Diagnosis not present

## 2019-01-14 DIAGNOSIS — Z1331 Encounter for screening for depression: Secondary | ICD-10-CM | POA: Diagnosis not present

## 2019-01-14 DIAGNOSIS — E7849 Other hyperlipidemia: Secondary | ICD-10-CM | POA: Diagnosis not present

## 2019-01-14 DIAGNOSIS — E1129 Type 2 diabetes mellitus with other diabetic kidney complication: Secondary | ICD-10-CM | POA: Diagnosis not present

## 2019-01-14 DIAGNOSIS — R972 Elevated prostate specific antigen [PSA]: Secondary | ICD-10-CM | POA: Diagnosis not present

## 2019-01-14 DIAGNOSIS — N183 Chronic kidney disease, stage 3 (moderate): Secondary | ICD-10-CM | POA: Diagnosis not present

## 2019-01-14 DIAGNOSIS — Z6824 Body mass index (BMI) 24.0-24.9, adult: Secondary | ICD-10-CM | POA: Diagnosis not present

## 2019-01-14 DIAGNOSIS — Z0000 Encounter for general adult medical examination without abnormal findings: Secondary | ICD-10-CM | POA: Diagnosis not present

## 2019-01-14 DIAGNOSIS — Z1339 Encounter for screening examination for other mental health and behavioral disorders: Secondary | ICD-10-CM | POA: Diagnosis not present

## 2019-07-08 DIAGNOSIS — Z23 Encounter for immunization: Secondary | ICD-10-CM | POA: Diagnosis not present

## 2019-08-31 DIAGNOSIS — H2513 Age-related nuclear cataract, bilateral: Secondary | ICD-10-CM | POA: Diagnosis not present

## 2019-08-31 DIAGNOSIS — H5203 Hypermetropia, bilateral: Secondary | ICD-10-CM | POA: Diagnosis not present

## 2019-12-01 ENCOUNTER — Ambulatory Visit: Payer: Medicare Other | Attending: Internal Medicine

## 2019-12-01 DIAGNOSIS — Z23 Encounter for immunization: Secondary | ICD-10-CM | POA: Insufficient documentation

## 2019-12-01 NOTE — Progress Notes (Signed)
   Covid-19 Vaccination Clinic  Name:  Dennis Carey    MRN: [id number] DOB: [birth year]  12/01/2019  [name] was observed post Covid-19 immunization for 15 minutes without incidence. He was provided with Vaccine Information Sheet and instruction to access the V-Safe system.   [name] was instructed to call 911 with any severe reactions post vaccine: Marland Kitchen Difficulty breathing  . Swelling of your face and throat  . A fast heartbeat  . A bad rash all over your body  . Dizziness and weakness    Immunizations Administered    Name Date Dose VIS Date Route   Pfizer COVID-19 Vaccine 12/01/2019 11:30 AM 0.3 mL 10/21/2019 Intramuscular   Manufacturer: [place]   Lot: [id number]   [place]: [id number]

## 2019-12-22 ENCOUNTER — Ambulatory Visit: Payer: Medicare Other | Attending: Internal Medicine

## 2019-12-22 DIAGNOSIS — Z23 Encounter for immunization: Secondary | ICD-10-CM | POA: Insufficient documentation

## 2019-12-22 NOTE — Progress Notes (Signed)
   Covid-19 Vaccination Clinic  Name:  Dennis Carey    MRN: [id number] DOB: 09-Apr-1935  12/22/2019  [name] was observed post Covid-19 immunization for 15 minutes without incidence. He was provided with Vaccine Information Sheet and instruction to access the V-Safe system.   [name] was instructed to call 911 with any severe reactions post vaccine: Marland Kitchen Difficulty breathing  . Swelling of your face and throat  . A fast heartbeat  . A bad rash all over your body  . Dizziness and weakness    Immunizations Administered    Name Date Dose VIS Date Route   Pfizer COVID-19 Vaccine 12/22/2019 11:44 AM 0.3 mL 10/21/2019 Intramuscular   Manufacturer: [place]   Lot: [id number]   [place]: [id number]

## 2020-01-04 DIAGNOSIS — L821 Other seborrheic keratosis: Secondary | ICD-10-CM | POA: Diagnosis not present

## 2020-01-04 DIAGNOSIS — D225 Melanocytic nevi of trunk: Secondary | ICD-10-CM | POA: Diagnosis not present

## 2020-01-04 DIAGNOSIS — D2261 Melanocytic nevi of right upper limb, including shoulder: Secondary | ICD-10-CM | POA: Diagnosis not present

## 2020-01-04 DIAGNOSIS — L853 Xerosis cutis: Secondary | ICD-10-CM | POA: Diagnosis not present

## 2020-01-04 DIAGNOSIS — L570 Actinic keratosis: Secondary | ICD-10-CM | POA: Diagnosis not present

## 2020-01-11 DIAGNOSIS — E1129 Type 2 diabetes mellitus with other diabetic kidney complication: Secondary | ICD-10-CM | POA: Diagnosis not present

## 2020-01-11 DIAGNOSIS — Z125 Encounter for screening for malignant neoplasm of prostate: Secondary | ICD-10-CM | POA: Diagnosis not present

## 2020-01-11 DIAGNOSIS — E7849 Other hyperlipidemia: Secondary | ICD-10-CM | POA: Diagnosis not present

## 2020-01-20 DIAGNOSIS — Z0000 Encounter for general adult medical examination without abnormal findings: Secondary | ICD-10-CM | POA: Diagnosis not present

## 2020-01-20 DIAGNOSIS — N1831 Chronic kidney disease, stage 3a: Secondary | ICD-10-CM | POA: Diagnosis not present

## 2020-01-20 DIAGNOSIS — I2510 Atherosclerotic heart disease of native coronary artery without angina pectoris: Secondary | ICD-10-CM | POA: Diagnosis not present

## 2020-01-20 DIAGNOSIS — Z23 Encounter for immunization: Secondary | ICD-10-CM | POA: Diagnosis not present

## 2020-01-20 DIAGNOSIS — Z1331 Encounter for screening for depression: Secondary | ICD-10-CM | POA: Diagnosis not present

## 2020-01-20 DIAGNOSIS — Z1339 Encounter for screening examination for other mental health and behavioral disorders: Secondary | ICD-10-CM | POA: Diagnosis not present

## 2020-01-20 DIAGNOSIS — E785 Hyperlipidemia, unspecified: Secondary | ICD-10-CM | POA: Diagnosis not present

## 2020-01-20 DIAGNOSIS — R972 Elevated prostate specific antigen [PSA]: Secondary | ICD-10-CM | POA: Diagnosis not present

## 2020-01-20 DIAGNOSIS — I10 Essential (primary) hypertension: Secondary | ICD-10-CM | POA: Diagnosis not present

## 2020-01-20 DIAGNOSIS — R82998 Other abnormal findings in urine: Secondary | ICD-10-CM | POA: Diagnosis not present

## 2020-01-20 DIAGNOSIS — E1129 Type 2 diabetes mellitus with other diabetic kidney complication: Secondary | ICD-10-CM | POA: Diagnosis not present

## 2020-02-09 ENCOUNTER — Telehealth: Payer: Self-pay | Admitting: Neurology

## 2020-02-09 DIAGNOSIS — M79604 Pain in right leg: Secondary | ICD-10-CM | POA: Diagnosis not present

## 2020-02-09 NOTE — Telephone Encounter (Signed)
Patient is on schedule for EMG/NCV 02/23/20 @ 8:30 AM arrival 8:00 AM.

## 2020-02-09 NOTE — Telephone Encounter (Signed)
I spoke to patient today, patient has some progressive pain and it appears very localized laterally on the leg, there is no swelling, pulses are good, no weakness, it does not sound like nerve injury he denies any radiating pain numbness tingling burning, he just says it really hurts and is keeping him up at night.  He wanted to come in sooner, I can set him up for EMG nerve conduction study on April 15 however I did tell him that it does not sound from the conversation like it is a nerve and he really needs to go see primary care, he sounded a little hesitant I am not quite sure why, but I did tell him there could be multiple things wrong, he could have a lesion in the bone or some sort of metastasis there or other issues causing severe pain not responsive to analgesics and keeping him up at night.  Put him on my schedule for April 15 but he has to go to primary care prior to that to rule out other issues, would hate to do an EMG nerve conduction study that was unnecessary.

## 2020-02-15 DIAGNOSIS — M79604 Pain in right leg: Secondary | ICD-10-CM | POA: Diagnosis not present

## 2020-02-23 ENCOUNTER — Ambulatory Visit (INDEPENDENT_AMBULATORY_CARE_PROVIDER_SITE_OTHER): Payer: Medicare Other | Admitting: Neurology

## 2020-02-23 ENCOUNTER — Other Ambulatory Visit: Payer: Self-pay

## 2020-02-23 ENCOUNTER — Ambulatory Visit
Admission: RE | Admit: 2020-02-23 | Discharge: 2020-02-23 | Disposition: A | Discharge status: Home / Self Care | Payer: Medicare Other | Source: Ambulatory Visit | Attending: Neurology | Admitting: Neurology

## 2020-02-23 ENCOUNTER — Telehealth: Payer: Self-pay | Admitting: *Deleted

## 2020-02-23 DIAGNOSIS — M79661 Pain in right lower leg: Secondary | ICD-10-CM | POA: Diagnosis not present

## 2020-02-23 DIAGNOSIS — Z0289 Encounter for other administrative examinations: Secondary | ICD-10-CM

## 2020-02-23 NOTE — Telephone Encounter (Signed)
-----   Message from Melvenia Beam, MD sent at 02/23/2020  2:20 PM EDT ----- No DVT. Please call by the end of the day, this result can't wait until Monday. As discussed I will try and order an MRI of his lower leg , thanks

## 2020-02-23 NOTE — Progress Notes (Signed)
History/EMG/NCS: Is a very nice 85 year old patient who comes in today with focal right lateral pain for 3 weeks.  The pain is in the right lower leg in the area of the lateral gastroc, no swelling, no lesions, no pain to palpation the patient states that the pain has been consistent and severe, necessitating opioids, he has had x-rays completed, he denies low back pain, he denies any weakness, he denies any aggravating factors, no walking difficulties, no radicular pain, the symptoms are not burning, tingling or numbness, just "pain".  EMG nerve conduction study on the right leg was normal, I do not believe that this is neurogenic and I reviewed today's results with patient.  Focused examination showed normal strength in the lower extremities, normal reflexes in the lower extremities, again no swelling or lesions, no pain to palpation.  Patient brings a CD with him today with x-rays of the lumbar spine and pelvis and of the right lower extremity, I reviewed the images and did not see anything significant however this is not my area of specialty and there were no radiology reports on the CD as I explained to patient.  I think it is less likely but we have to check for DVT and I will send him there today(was negative).  I discussed all this with patient at depth, the neck step would probably be an MRI of his lower extremity if there is nothing seen on the ultrasound, I am happy to try to order it however considering this is not in my area of expertise it may not get approved for me and we may need to send him back to his primary care. Since ultrasound negative will order MRI lower extremity.  Orders Placed This Encounter  Procedures  . US Venous Img Lower Unilateral Right (DVT)  . MR TIBIA FIBULA RIGHT WO CONTRAST  . NCV with EMG(electromyography)     I spent 30 minutes of face-to-face and non-face-to-face time with patient on the  1. Right calf pain   2. Pain in right lower leg    diagnosis.  This  included previsit chart review, lab review, study review, order entry, electronic health record documentation, patient education on the different diagnostic and therapeutic options, counseling and coordination of care, risks and benefits of management, compliance, or risk factor reduction. This does not include time spent on emg/ncs

## 2020-02-23 NOTE — Telephone Encounter (Addendum)
Spoke with pt and let him know per Dr. Jaynee Eagles, his lower extremity venous study results indicate his does NOT have a DVT. Dr. Jaynee Eagles will try to order an MRI of his lower leg. The pt verbalized understanding and agreement to the plan. His questions were answered during the call. Pt will keep the 5/26 office visit and will wait to hear about the MRI.

## 2020-02-23 NOTE — Progress Notes (Signed)
See procedure note.

## 2020-02-24 ENCOUNTER — Encounter: Payer: Self-pay | Admitting: Neurology

## 2020-02-24 NOTE — Progress Notes (Signed)
        Full Name: Dennis Carey Gender: Male MRN #: [id number] Date of Birth: 01-27-1935    Visit Date: 02/23/2020 07:17 Age: 85 Years Examining Physician: Sarina Ill, MD  Referring Physician: Sarina Ill, MD Height: 5 feet 7 inch  History: right lower extremity calf pain  Summary: EMG/NCS performed on the right lower extremity. All nerves and muscles (as indicated in the following tables) were within normal limits.       Conclusion: This is a normal study. No evidence for mononeuropathy, polyneuropathy, radiculopathy or muscle disease to explain patient's symptoms.  Sarina Ill M.D.  [facility] [place], [place] [address] Tel: [phone] Fax: [phone]         [facility]    Nerve / Sites Muscle Latency Ref. Amplitude Ref. Rel Amp Segments Distance Velocity Ref. Area    ms ms mV mV %  cm m/s m/s mVms  R Peroneal - EDB     Ankle EDB 4.7 ?6.5 5.7 ?2.0 100 Ankle - EDB 9   13.4     Fib head EDB 10.3  5.4  95.6 Fib head - Ankle 26 46 ?44 13.9     Pop fossa EDB 12.5  5.2  96.2 Pop fossa - Fib head 10 47 ?44 13.6         Pop fossa - Ankle      R Tibial - AH     Ankle AH 3.5 ?5.8 8.0 ?4.0 100 Ankle - AH 9   19.3     Pop fossa AH 11.8  6.6  81.9 Pop fossa - Ankle 35 43 ?41 19.1         SNC    Nerve / Sites Rec. Site Peak Lat Ref.  Amp Ref. Segments Distance    ms ms V V  cm  R Sural - Ankle (Calf)     Calf Ankle 3.8 ?4.4 8 ?6 Calf - Ankle 14  R Superficial peroneal - Ankle     Lat leg Ankle 4.1 ?4.4 10 ?6 Lat leg - Ankle 14         F  Wave    Nerve F Lat Ref.   ms ms  R Tibial - AH 49.7 ?56.0       EMG Summary Table    Spontaneous MUAP Recruitment  Muscle IA Fib PSW Fasc Other Amp Dur. Poly Pattern  R. Vastus medialis Normal None None None _______ Normal Normal Normal Normal  R. Tibialis anterior Normal None None None _______ Normal Normal Normal Normal  R. Gastrocnemius (Lateral head) Normal None None None _______ Normal Normal  Normal Normal  R. Gastrocnemius (Medial head) Normal None None None _______ Normal Normal Normal Normal  R. Peroneus longus Normal None None None _______ Normal Normal Normal Normal  R. Extensor hallucis longus Normal None None None _______ Normal Normal Normal Normal  R. Biceps femoris (long head) Normal None None None _______ Normal Normal Normal Normal  R. Gluteus medius Normal None None None _______ Normal Normal Normal Normal  R. Lumbar paraspinals (low) Normal None None None _______ Normal Normal Normal Normal

## 2020-02-24 NOTE — Procedures (Signed)
        Full Name: Arcangelo Allums Gender: Male MRN #: [id number] Date of Birth: 1934-11-26    Visit Date: 02/23/2020 07:17 Age: 85 Years Examining Physician: Sarina Ill, MD  Height: 5 feet 7 inch  History: right lower extremity calf pain  Summary: EMG/NCS performed on the right lower extremity. All nerves and muscles (as indicated in the following tables) were within normal limits.       Conclusion: This is a normal study. No evidence for mononeuropathy, polyneuropathy, radiculopathy or muscle disease to explain patient's symptoms.  Sarina Ill M.D.  [facility] [place], [place] [address] Tel: [phone] Fax: [phone]         [facility]    Nerve / Sites Muscle Latency Ref. Amplitude Ref. Rel Amp Segments Distance Velocity Ref. Area    ms ms mV mV %  cm m/s m/s mVms  R Peroneal - EDB     Ankle EDB 4.7 ?6.5 5.7 ?2.0 100 Ankle - EDB 9   13.4     Fib head EDB 10.3  5.4  95.6 Fib head - Ankle 26 46 ?44 13.9     Pop fossa EDB 12.5  5.2  96.2 Pop fossa - Fib head 10 47 ?44 13.6         Pop fossa - Ankle      R Tibial - AH     Ankle AH 3.5 ?5.8 8.0 ?4.0 100 Ankle - AH 9   19.3     Pop fossa AH 11.8  6.6  81.9 Pop fossa - Ankle 35 43 ?41 19.1         SNC    Nerve / Sites Rec. Site Peak Lat Ref.  Amp Ref. Segments Distance    ms ms V V  cm  R Sural - Ankle (Calf)     Calf Ankle 3.8 ?4.4 8 ?6 Calf - Ankle 14  R Superficial peroneal - Ankle     Lat leg Ankle 4.1 ?4.4 10 ?6 Lat leg - Ankle 14         F  Wave    Nerve F Lat Ref.   ms ms  R Tibial - AH 49.7 ?56.0       EMG Summary Table    Spontaneous MUAP Recruitment  Muscle IA Fib PSW Fasc Other Amp Dur. Poly Pattern  R. Vastus medialis Normal None None None _______ Normal Normal Normal Normal  R. Tibialis anterior Normal None None None _______ Normal Normal Normal Normal  R. Gastrocnemius (Lateral head) Normal None None None _______ Normal Normal Normal Normal  R. Gastrocnemius (Medial  head) Normal None None None _______ Normal Normal Normal Normal  R. Peroneus longus Normal None None None _______ Normal Normal Normal Normal  R. Extensor hallucis longus Normal None None None _______ Normal Normal Normal Normal  R. Biceps femoris (long head) Normal None None None _______ Normal Normal Normal Normal  R. Gluteus medius Normal None None None _______ Normal Normal Normal Normal  R. Lumbar paraspinals (low) Normal None None None _______ Normal Normal Normal Normal

## 2020-02-27 ENCOUNTER — Telehealth: Payer: Self-pay | Admitting: Neurology

## 2020-02-27 NOTE — Telephone Encounter (Signed)
Medicare/bcbs supp order sent to GI. No auth they will reach out to the patient to schedule.  

## 2020-03-07 ENCOUNTER — Telehealth: Payer: Self-pay | Admitting: Neurology

## 2020-03-07 NOTE — Telephone Encounter (Signed)
Returned pt's call. He has MRI scheduled two Advil with tylenol QID, supplementing with occasional Vicodin but this is not controlling the pain. At night he sleeps about 3 hours then gets up and takes something. He would like Dr. Cathren Laine opinion to see if he should stay on the Advil, stay off and do Vicodin strictly, or if a short course of steroids would help? He also tried heating pad and ice pack and they do not help either.

## 2020-03-07 NOTE — Telephone Encounter (Signed)
[facility] to see if they are able to get him a sooner appt.

## 2020-03-07 NOTE — Telephone Encounter (Signed)
Pt is asking for a call from RN to discuss pain medication for right leg pain

## 2020-03-07 NOTE — Telephone Encounter (Signed)
I really don't know because I don't know what is wrong, he should see his primary care again. I ordered the MRI of his limb, did he get it scheduled?

## 2020-03-07 NOTE — Telephone Encounter (Signed)
Patient is scheduled at [facility] for 03/14/20.

## 2020-03-08 NOTE — Telephone Encounter (Signed)
I returned pt's call and let him know that Dr. Jaynee Eagles would like him to call primary care to further discuss medication for the pain as we do not know what we are treating at this time. Pt verbalized appreciation for the call. He said will continue what he is doing for now. MRI pending.

## 2020-03-14 DIAGNOSIS — R936 Abnormal findings on diagnostic imaging of limbs: Secondary | ICD-10-CM | POA: Diagnosis not present

## 2020-03-14 DIAGNOSIS — M79661 Pain in right lower leg: Secondary | ICD-10-CM | POA: Diagnosis not present

## 2020-03-20 DIAGNOSIS — M5416 Radiculopathy, lumbar region: Secondary | ICD-10-CM | POA: Diagnosis not present

## 2020-03-22 ENCOUNTER — Telehealth: Payer: Self-pay | Admitting: *Deleted

## 2020-03-22 DIAGNOSIS — M5116 Intervertebral disc disorders with radiculopathy, lumbar region: Secondary | ICD-10-CM | POA: Diagnosis not present

## 2020-03-22 DIAGNOSIS — M48061 Spinal stenosis, lumbar region without neurogenic claudication: Secondary | ICD-10-CM | POA: Diagnosis not present

## 2020-03-22 DIAGNOSIS — M5416 Radiculopathy, lumbar region: Secondary | ICD-10-CM | POA: Diagnosis not present

## 2020-03-22 NOTE — Telephone Encounter (Addendum)
Attempted to fax MRI results to [facility] multiple times. Fax line busy.

## 2020-03-22 NOTE — Telephone Encounter (Signed)
MRI results faxed to [facility]. Received a receipt of confirmation.

## 2020-03-22 NOTE — Telephone Encounter (Signed)
I called [facility] to confirm we have the right fax number. Staff stated the line may be busy as this is the right fax number [phone].   I called the patient and discussed results of MRI leg- no imaging findings to explain the R lateral calf pain. Incidental finding ?degeneration or tear in lateral meniscus. Pt stated that was actually seen many years ago (either the 23s or 90s). Pt provided an update for Dr. Jaynee Eagles, stating he recently saw Dr. Annette Stable who gave him a medrol dose pack and Gabapentin, recommended steroid injection. Pt had an MRI of his back today. Pt is going to see Dr. Brien Few next week for eval and then be scheduled for an injection. Pt was very appreciative of everything Dr. Jaynee Eagles did to try to find the source of his leg pain. Pt understands we are in the process of faxing the MRI results to Dr. Shon Baton office.

## 2020-03-26 ENCOUNTER — Other Ambulatory Visit: Payer: Medicare Other

## 2020-03-29 DIAGNOSIS — M5416 Radiculopathy, lumbar region: Secondary | ICD-10-CM | POA: Diagnosis not present

## 2020-04-03 DIAGNOSIS — M5416 Radiculopathy, lumbar region: Secondary | ICD-10-CM | POA: Diagnosis not present

## 2020-04-04 ENCOUNTER — Ambulatory Visit: Payer: Medicare Other | Admitting: Neurology

## 2020-04-06 ENCOUNTER — Other Ambulatory Visit: Payer: Self-pay

## 2020-04-06 ENCOUNTER — Emergency Department (HOSPITAL_COMMUNITY)
Admission: EM | Admit: 2020-04-06 | Discharge: 2020-04-06 | Disposition: A | Discharge status: Home / Self Care | Payer: Medicare Other | Attending: Emergency Medicine | Admitting: Emergency Medicine

## 2020-04-06 DIAGNOSIS — Z5321 Procedure and treatment not carried out due to patient leaving prior to being seen by health care provider: Secondary | ICD-10-CM | POA: Insufficient documentation

## 2020-04-06 NOTE — ED Notes (Signed)
Pt notified registration to take him out of the system and that the pt was leaving. Pt seen leaving waiting room.

## 2020-04-17 DIAGNOSIS — M5416 Radiculopathy, lumbar region: Secondary | ICD-10-CM | POA: Diagnosis not present

## 2020-04-17 DIAGNOSIS — R030 Elevated blood-pressure reading, without diagnosis of hypertension: Secondary | ICD-10-CM | POA: Diagnosis not present

## 2020-04-19 ENCOUNTER — Other Ambulatory Visit: Payer: Self-pay | Admitting: Neurosurgery

## 2020-04-19 ENCOUNTER — Other Ambulatory Visit: Payer: Self-pay

## 2020-04-19 ENCOUNTER — Inpatient Hospital Stay (HOSPITAL_COMMUNITY)
Admission: AD | Admit: 2020-04-19 | Discharge: 2020-04-22 | DRG: 517 | Disposition: A | Discharge status: Home Health | Payer: Medicare Other | Attending: Neurosurgery | Admitting: Neurosurgery

## 2020-04-19 DIAGNOSIS — I2510 Atherosclerotic heart disease of native coronary artery without angina pectoris: Secondary | ICD-10-CM | POA: Diagnosis present

## 2020-04-19 DIAGNOSIS — N183 Chronic kidney disease, stage 3 unspecified: Secondary | ICD-10-CM | POA: Diagnosis present

## 2020-04-19 DIAGNOSIS — M5116 Intervertebral disc disorders with radiculopathy, lumbar region: Secondary | ICD-10-CM | POA: Diagnosis present

## 2020-04-19 DIAGNOSIS — I10 Essential (primary) hypertension: Secondary | ICD-10-CM | POA: Diagnosis not present

## 2020-04-19 DIAGNOSIS — E785 Hyperlipidemia, unspecified: Secondary | ICD-10-CM | POA: Diagnosis present

## 2020-04-19 DIAGNOSIS — E860 Dehydration: Secondary | ICD-10-CM | POA: Diagnosis present

## 2020-04-19 DIAGNOSIS — Z882 Allergy status to sulfonamides status: Secondary | ICD-10-CM

## 2020-04-19 DIAGNOSIS — Z79899 Other long term (current) drug therapy: Secondary | ICD-10-CM

## 2020-04-19 DIAGNOSIS — M1990 Unspecified osteoarthritis, unspecified site: Secondary | ICD-10-CM | POA: Diagnosis present

## 2020-04-19 DIAGNOSIS — M5417 Radiculopathy, lumbosacral region: Secondary | ICD-10-CM | POA: Diagnosis not present

## 2020-04-19 DIAGNOSIS — M5416 Radiculopathy, lumbar region: Secondary | ICD-10-CM | POA: Diagnosis present

## 2020-04-19 DIAGNOSIS — Z7982 Long term (current) use of aspirin: Secondary | ICD-10-CM

## 2020-04-19 DIAGNOSIS — M4726 Other spondylosis with radiculopathy, lumbar region: Secondary | ICD-10-CM | POA: Diagnosis present

## 2020-04-19 DIAGNOSIS — M48061 Spinal stenosis, lumbar region without neurogenic claudication: Secondary | ICD-10-CM | POA: Diagnosis not present

## 2020-04-19 DIAGNOSIS — Z20822 Contact with and (suspected) exposure to covid-19: Secondary | ICD-10-CM | POA: Diagnosis present

## 2020-04-19 DIAGNOSIS — M4807 Spinal stenosis, lumbosacral region: Secondary | ICD-10-CM | POA: Diagnosis not present

## 2020-04-19 DIAGNOSIS — M5010 Cervical disc disorder with radiculopathy, unspecified cervical region: Secondary | ICD-10-CM | POA: Diagnosis not present

## 2020-04-19 DIAGNOSIS — Z981 Arthrodesis status: Secondary | ICD-10-CM | POA: Diagnosis not present

## 2020-04-19 DIAGNOSIS — I129 Hypertensive chronic kidney disease with stage 1 through stage 4 chronic kidney disease, or unspecified chronic kidney disease: Secondary | ICD-10-CM | POA: Diagnosis present

## 2020-04-19 DIAGNOSIS — Z419 Encounter for procedure for purposes other than remedying health state, unspecified: Secondary | ICD-10-CM

## 2020-04-19 DIAGNOSIS — M5136 Other intervertebral disc degeneration, lumbar region: Secondary | ICD-10-CM | POA: Diagnosis not present

## 2020-04-19 LAB — COMPREHENSIVE METABOLIC PANEL
ALT: 39 U/L (ref 0–44)
AST: 26 U/L (ref 15–41)
Albumin: 3.8 g/dL (ref 3.5–5.0)
Alkaline Phosphatase: 52 U/L (ref 38–126)
Anion gap: 10 (ref 5–15)
BUN: 16 mg/dL (ref 8–23)
CO2: 21 mmol/L — ABNORMAL LOW (ref 22–32)
Calcium: 9 mg/dL (ref 8.9–10.3)
Chloride: 107 mmol/L (ref 98–111)
Creatinine, Ser: 1.5 mg/dL — ABNORMAL HIGH (ref 0.61–1.24)
GFR calc Af Amer: 50 mL/min — ABNORMAL LOW (ref >60)
GFR calc non Af Amer: 43 mL/min — ABNORMAL LOW (ref >60)
Glucose, Bld: 185 mg/dL — ABNORMAL HIGH (ref 70–99)
Potassium: 4.4 mmol/L (ref 3.5–5.1)
Sodium: 138 mmol/L (ref 135–145)
Total Bilirubin: 1.8 mg/dL — ABNORMAL HIGH (ref 0.3–1.2)
Total Protein: 6.3 g/dL — ABNORMAL LOW (ref 6.5–8.1)

## 2020-04-19 LAB — CBC
HCT: 39.8 % (ref 39.0–52.0)
Hemoglobin: 13.7 g/dL (ref 13.0–17.0)
MCH: 30.4 pg (ref 26.0–34.0)
MCHC: 34.4 g/dL (ref 30.0–36.0)
MCV: 88.2 fL (ref 80.0–100.0)
Platelets: 197 10*3/uL (ref 150–400)
RBC: 4.51 MIL/uL (ref 4.22–5.81)
RDW: 11.6 % (ref 11.5–15.5)
WBC: 8.6 10*3/uL (ref 4.0–10.5)
nRBC: 0 % (ref 0.0–0.2)

## 2020-04-19 LAB — SARS CORONAVIRUS 2 BY RT PCR (HOSPITAL ORDER, PERFORMED IN ~~LOC~~ HOSPITAL LAB): SARS Coronavirus 2: NEGATIVE

## 2020-04-19 MED ORDER — HYDROMORPHONE HCL 1 MG/ML IJ SOLN
0.5000 mg | INTRAMUSCULAR | Status: DC | PRN
  Administered 2020-04-19 – 2020-04-20 (×2): 0.5 mg via INTRAVENOUS
  Filled 2020-04-19 (×2): qty 0.5

## 2020-04-19 MED ORDER — CHLORHEXIDINE GLUCONATE CLOTH 2 % EX PADS
6.0000 | MEDICATED_PAD | Freq: Once | CUTANEOUS | Status: AC
  Administered 2020-04-19: 6 via TOPICAL

## 2020-04-19 MED ORDER — OXYCODONE HCL 5 MG PO TABS
10.0000 mg | ORAL_TABLET | ORAL | Status: DC | PRN
  Administered 2020-04-19: 10 mg via ORAL
  Filled 2020-04-19 (×2): qty 2

## 2020-04-19 MED ORDER — SODIUM CHLORIDE 0.9% FLUSH: 3.0000 mL | INTRAVENOUS | Status: DC | PRN

## 2020-04-19 MED ORDER — CHLORHEXIDINE GLUCONATE CLOTH 2 % EX PADS
6.0000 | MEDICATED_PAD | Freq: Once | CUTANEOUS | Status: AC
  Administered 2020-04-20: 6 via TOPICAL

## 2020-04-19 MED ORDER — SODIUM CHLORIDE 0.9 % IV SOLN: 250.0000 mL | INTRAVENOUS | Status: DC | PRN

## 2020-04-19 MED ORDER — BISACODYL 10 MG RE SUPP: 10.0000 mg | Freq: Every day | RECTAL | Status: DC | PRN

## 2020-04-19 MED ORDER — ACETAMINOPHEN 650 MG RE SUPP: 650.0000 mg | Freq: Four times a day (QID) | RECTAL | Status: DC | PRN

## 2020-04-19 MED ORDER — EZETIMIBE 10 MG PO TABS
10.0000 mg | ORAL_TABLET | Freq: Every day | ORAL | Status: DC
  Administered 2020-04-19: 10 mg via ORAL
  Filled 2020-04-19: qty 1

## 2020-04-19 MED ORDER — ONDANSETRON HCL 4 MG/2ML IJ SOLN
4.0000 mg | Freq: Four times a day (QID) | INTRAMUSCULAR | Status: DC | PRN
  Administered 2020-04-19: 4 mg via INTRAVENOUS
  Filled 2020-04-19: qty 2

## 2020-04-19 MED ORDER — SODIUM CHLORIDE 0.9 % IV BOLUS
1000.0000 mL | Freq: Once | INTRAVENOUS | Status: AC
  Administered 2020-04-19: 1000 mL via INTRAVENOUS

## 2020-04-19 MED ORDER — ACETAMINOPHEN 325 MG PO TABS: 650.0000 mg | ORAL_TABLET | Freq: Four times a day (QID) | ORAL | Status: DC | PRN

## 2020-04-19 MED ORDER — SODIUM CHLORIDE 0.9 % IV SOLN: INTRAVENOUS | Status: DC

## 2020-04-19 MED ORDER — CEFAZOLIN SODIUM-DEXTROSE 2-4 GM/100ML-% IV SOLN
2.0000 g | INTRAVENOUS | Status: AC
  Administered 2020-04-20: 2 g via INTRAVENOUS
  Filled 2020-04-19: qty 100

## 2020-04-19 MED ORDER — SODIUM CHLORIDE 0.9% FLUSH: 3.0000 mL | Freq: Two times a day (BID) | INTRAVENOUS | Status: DC

## 2020-04-19 MED ORDER — POLYETHYLENE GLYCOL 3350 17 G PO PACK: 17.0000 g | PACK | Freq: Every day | ORAL | Status: DC | PRN

## 2020-04-19 MED ORDER — ONDANSETRON HCL 4 MG PO TABS: 4.0000 mg | ORAL_TABLET | Freq: Four times a day (QID) | ORAL | Status: DC | PRN

## 2020-04-19 MED ORDER — DEXAMETHASONE SODIUM PHOSPHATE 10 MG/ML IJ SOLN
10.0000 mg | Freq: Once | INTRAMUSCULAR | Status: AC
  Administered 2020-04-19: 10 mg via INTRAVENOUS
  Filled 2020-04-19: qty 1

## 2020-04-19 MED ORDER — ROSUVASTATIN CALCIUM 20 MG PO TABS
20.0000 mg | ORAL_TABLET | Freq: Every day | ORAL | Status: DC
  Administered 2020-04-19: 20 mg via ORAL
  Filled 2020-04-19: qty 1

## 2020-04-19 NOTE — Progress Notes (Signed)
Pt arrived to 3W28 as a direct admission from home with wife at bedside. MD Pool's medical assistant notified of patient's arrival. Patient having 10/10 sharp pain in right leg, with severe nausea. Vitals stable.

## 2020-04-19 NOTE — Progress Notes (Signed)
Called NP Josh McDaniels regarding patients need for Covid19 test for this hospital admission. Instructed to place order under Dr Annette Stable.

## 2020-04-19 NOTE — H&P (Signed)
  Dennis Carey is an 85 y.o. male.   Chief Complaint:  Right leg pain HPI:  85 year old male with persistent right lower extremity radicular pain consistent with a right-sided L5 radiculopathy which has failed conservative management including injections, anti-inflammatory medications, Neurontin, and analgesics.  Her workup demonstrates evidence of significant spondylosis with foraminal stenosis on the right at L5-S1.  Patient was planned to undergo surgery tomorrow morning at a local surgery center but is having difficulty with medication side effects, dehydration and pain control.  He requests admission to the hospital today and plan for surgery at the hospital tomorrow.  He has no significant left-sided symptoms.  He has no bowel or bladder dysfunction.  Past Medical History: Diagnosis Date . CAD (coronary artery disease)   significant CAD by cardiac calcium scoring test . CKD (chronic kidney disease), stage III   by serum creatinine is stable . HTN (hypertension)   pt denies  . Hyperlipidemia  . Microhematuria  . PSA elevation 2014  4.75 February 2017, 6.9 in 2/18 and declines urologist eval or trial of antibiotics   Past Surgical History: Procedure Laterality Date . dental implants    3 of them 2008 . submucous resection nose    1956   Family History Problem Relation Age of Onset . Colon cancer Neg Hx  . Migraines Neg Hx   Social History:  reports that he has never smoked. He has never used smokeless tobacco. He reports current alcohol use. He reports that he does not use drugs.  Allergies:  Allergies Allergen Reactions . Sulfa Antibiotics Hives   Medications Prior to Admission Medication Sig Dispense Refill . aspirin 81 MG tablet Take 81 mg by mouth daily.   Marland Kitchen ezetimibe (ZETIA) 10 MG tablet Take 10 mg by mouth at bedtime.    . Oxycodone HCl 10 MG TABS Take 10 mg by mouth every 4 (four) hours as needed for pain.   . rosuvastatin (CRESTOR) 20 MG tablet Take 20 mg by  mouth at bedtime.      No results found for this or any previous visit (from the past 48 hour(s)). No results found.  Pertinent items noted in HPI and remainder of comprehensive ROS otherwise negative.  Blood pressure (!) 153/83, pulse 88, temperature 97.6 F (36.4 C), temperature source Oral, resp. rate 18, SpO2 100 %.    Patient is awake and alert.  He is oriented and  Appropriate.  Cranial nerve function normal bilaterally.  Motor examination  Normal bilaterally.  Sensory examinationwith some decreased sensation in his distal right L5 dermatome.  Reflexes hypoactive but symmetric.  No evidence of long tract signs.  Gait somewhat antalgic.  Posture is normal.  Examination head ears eyes known hurts unremarkable.  Chest and abdomen are benign.  Extremities are free from injury deformity.  Assessment/Plan   Right L5 radiculopathy secondary to right L5-S1 foraminal stenosis.  Plan right L5-S1 decompressive laminotomy and foraminotomy tomorrow.  Risks and benefits have been explained.  Patient wishes to proceed.    Patient admitted today for pain control and medical optimization.  We will check current laboratory studies and give a fluid bolus to help with hydration as the patient's p.o. intake has been poor.  Mallie Mussel A Teria Khachatryan 04/19/2020, 4:14 PM  leg pain

## 2020-04-20 ENCOUNTER — Encounter (HOSPITAL_COMMUNITY): Payer: Self-pay | Admitting: Neurosurgery

## 2020-04-20 ENCOUNTER — Encounter (HOSPITAL_COMMUNITY)
Admission: AD | Disposition: A | Discharge status: Home Health | Payer: Self-pay | Source: Home / Self Care | Attending: Neurosurgery

## 2020-04-20 ENCOUNTER — Inpatient Hospital Stay (HOSPITAL_COMMUNITY): Payer: Medicare Other | Admitting: Anesthesiology

## 2020-04-20 ENCOUNTER — Inpatient Hospital Stay (HOSPITAL_COMMUNITY): Payer: Medicare Other

## 2020-04-20 ENCOUNTER — Ambulatory Visit (HOSPITAL_COMMUNITY): Admission: RE | Admit: 2020-04-20 | Payer: Medicare Other | Source: Home / Self Care | Admitting: Neurosurgery

## 2020-04-20 ENCOUNTER — Other Ambulatory Visit: Payer: Self-pay

## 2020-04-20 DIAGNOSIS — M5417 Radiculopathy, lumbosacral region: Secondary | ICD-10-CM | POA: Diagnosis not present

## 2020-04-20 DIAGNOSIS — M4807 Spinal stenosis, lumbosacral region: Secondary | ICD-10-CM | POA: Diagnosis not present

## 2020-04-20 DIAGNOSIS — M48061 Spinal stenosis, lumbar region without neurogenic claudication: Secondary | ICD-10-CM | POA: Diagnosis not present

## 2020-04-20 HISTORY — PX: LUMBAR LAMINECTOMY/DECOMPRESSION MICRODISCECTOMY: SHX5026

## 2020-04-20 LAB — SURGICAL PCR SCREEN
MRSA, PCR: NEGATIVE
Staphylococcus aureus: NEGATIVE

## 2020-04-20 SURGERY — LUMBAR LAMINECTOMY/DECOMPRESSION MICRODISCECTOMY 1 LEVEL
Anesthesia: General | Site: Back | Laterality: Right

## 2020-04-20 MED ORDER — ONDANSETRON HCL 4 MG PO TABS: 4.0000 mg | ORAL_TABLET | Freq: Four times a day (QID) | ORAL | Status: DC | PRN

## 2020-04-20 MED ORDER — SODIUM CHLORIDE 0.9 % IV SOLN
250.0000 mL | INTRAVENOUS | Status: DC
  Administered 2020-04-20: 250 mL via INTRAVENOUS

## 2020-04-20 MED ORDER — DEXAMETHASONE SODIUM PHOSPHATE 10 MG/ML IJ SOLN
INTRAMUSCULAR | Status: DC | PRN
  Administered 2020-04-20: 5 mg via INTRAVENOUS

## 2020-04-20 MED ORDER — SODIUM CHLORIDE 0.9% FLUSH
3.0000 mL | INTRAVENOUS | Status: DC | PRN
  Administered 2020-04-21: 3 mL via INTRAVENOUS

## 2020-04-20 MED ORDER — KETOROLAC TROMETHAMINE 30 MG/ML IJ SOLN
INTRAMUSCULAR | Status: AC
  Filled 2020-04-20: qty 1

## 2020-04-20 MED ORDER — ONDANSETRON HCL 4 MG/2ML IJ SOLN: 4.0000 mg | Freq: Four times a day (QID) | INTRAMUSCULAR | Status: DC | PRN

## 2020-04-20 MED ORDER — EPHEDRINE 5 MG/ML INJ
INTRAVENOUS | Status: AC
  Filled 2020-04-20: qty 10

## 2020-04-20 MED ORDER — ONDANSETRON HCL 4 MG/2ML IJ SOLN: 4.0000 mg | Freq: Once | INTRAMUSCULAR | Status: DC | PRN

## 2020-04-20 MED ORDER — BUPIVACAINE HCL (PF) 0.25 % IJ SOLN
INTRAMUSCULAR | Status: DC | PRN
  Administered 2020-04-20: 30 mL

## 2020-04-20 MED ORDER — ROCURONIUM BROMIDE 10 MG/ML (PF) SYRINGE
PREFILLED_SYRINGE | INTRAVENOUS | Status: AC
  Filled 2020-04-20: qty 10

## 2020-04-20 MED ORDER — PHENYLEPHRINE HCL-NACL 10-0.9 MG/250ML-% IV SOLN
INTRAVENOUS | Status: DC | PRN
  Administered 2020-04-20: 20 ug/min via INTRAVENOUS

## 2020-04-20 MED ORDER — FENTANYL CITRATE (PF) 100 MCG/2ML IJ SOLN: 25.0000 ug | INTRAMUSCULAR | Status: DC | PRN

## 2020-04-20 MED ORDER — FENTANYL CITRATE (PF) 250 MCG/5ML IJ SOLN
INTRAMUSCULAR | Status: AC
  Filled 2020-04-20: qty 5

## 2020-04-20 MED ORDER — CHLORHEXIDINE GLUCONATE 0.12 % MT SOLN
15.0000 mL | Freq: Every morning | OROMUCOSAL | Status: DC
  Filled 2020-04-20: qty 15

## 2020-04-20 MED ORDER — THROMBIN 5000 UNITS EX SOLR
CUTANEOUS | Status: DC | PRN
  Administered 2020-04-20 (×2): 5000 [IU] via TOPICAL

## 2020-04-20 MED ORDER — PHENYLEPHRINE 40 MCG/ML (10ML) SYRINGE FOR IV PUSH (FOR BLOOD PRESSURE SUPPORT)
PREFILLED_SYRINGE | INTRAVENOUS | Status: AC
  Filled 2020-04-20: qty 10

## 2020-04-20 MED ORDER — ROCURONIUM BROMIDE 10 MG/ML (PF) SYRINGE
PREFILLED_SYRINGE | INTRAVENOUS | Status: DC | PRN
  Administered 2020-04-20 (×2): 10 mg via INTRAVENOUS
  Administered 2020-04-20: 40 mg via INTRAVENOUS

## 2020-04-20 MED ORDER — LACTATED RINGERS IV SOLN: INTRAVENOUS | Status: DC | PRN

## 2020-04-20 MED ORDER — ACETAMINOPHEN 650 MG RE SUPP: 650.0000 mg | RECTAL | Status: DC | PRN

## 2020-04-20 MED ORDER — HYDROMORPHONE HCL 1 MG/ML IJ SOLN: 1.0000 mg | INTRAMUSCULAR | Status: DC | PRN

## 2020-04-20 MED ORDER — PROPOFOL 10 MG/ML IV BOLUS
INTRAVENOUS | Status: AC
  Filled 2020-04-20: qty 20

## 2020-04-20 MED ORDER — PHENYLEPHRINE HCL (PRESSORS) 10 MG/ML IV SOLN
INTRAVENOUS | Status: DC | PRN
Start: 2020-04-20 — End: 2020-04-20
  Administered 2020-04-20: 80 ug via INTRAVENOUS

## 2020-04-20 MED ORDER — ACETAMINOPHEN 325 MG PO TABS: 650.0000 mg | ORAL_TABLET | ORAL | Status: DC | PRN

## 2020-04-20 MED ORDER — LIDOCAINE 2% (20 MG/ML) 5 ML SYRINGE
INTRAMUSCULAR | Status: DC | PRN
  Administered 2020-04-20: 100 mg via INTRAVENOUS

## 2020-04-20 MED ORDER — SODIUM CHLORIDE 0.9% FLUSH
3.0000 mL | Freq: Two times a day (BID) | INTRAVENOUS | Status: DC
  Administered 2020-04-20 – 2020-04-21 (×3): 3 mL via INTRAVENOUS

## 2020-04-20 MED ORDER — LIDOCAINE 2% (20 MG/ML) 5 ML SYRINGE
INTRAMUSCULAR | Status: AC
  Filled 2020-04-20: qty 5

## 2020-04-20 MED ORDER — ACETAMINOPHEN 10 MG/ML IV SOLN: 1000.0000 mg | Freq: Once | INTRAVENOUS | Status: DC | PRN

## 2020-04-20 MED ORDER — ORAL CARE MOUTH RINSE: 15.0000 mL | Freq: Once | OROMUCOSAL | Status: AC

## 2020-04-20 MED ORDER — KETOROLAC TROMETHAMINE 30 MG/ML IJ SOLN
INTRAMUSCULAR | Status: DC | PRN
  Administered 2020-04-20: 30 mg via INTRAVENOUS

## 2020-04-20 MED ORDER — DEXAMETHASONE SODIUM PHOSPHATE 10 MG/ML IJ SOLN
INTRAMUSCULAR | Status: AC
  Filled 2020-04-20: qty 1

## 2020-04-20 MED ORDER — ONDANSETRON HCL 4 MG/2ML IJ SOLN
INTRAMUSCULAR | Status: AC
  Filled 2020-04-20: qty 2

## 2020-04-20 MED ORDER — FENTANYL CITRATE (PF) 100 MCG/2ML IJ SOLN
INTRAMUSCULAR | Status: DC | PRN
  Administered 2020-04-20 (×2): 25 ug via INTRAVENOUS
  Administered 2020-04-20: 50 ug via INTRAVENOUS
  Administered 2020-04-20: 25 ug via INTRAVENOUS

## 2020-04-20 MED ORDER — SUGAMMADEX SODIUM 200 MG/2ML IV SOLN
INTRAVENOUS | Status: DC | PRN
  Administered 2020-04-20: 200 mg via INTRAVENOUS

## 2020-04-20 MED ORDER — PROPOFOL 10 MG/ML IV BOLUS
INTRAVENOUS | Status: DC | PRN
  Administered 2020-04-20: 30 mg via INTRAVENOUS
  Administered 2020-04-20: 70 mg via INTRAVENOUS

## 2020-04-20 MED ORDER — 0.9 % SODIUM CHLORIDE (POUR BTL) OPTIME
TOPICAL | Status: DC | PRN
  Administered 2020-04-20: 1000 mL

## 2020-04-20 MED ORDER — DEXMEDETOMIDINE HCL IN NACL 80 MCG/20ML IV SOLN
INTRAVENOUS | Status: AC
  Filled 2020-04-20: qty 20

## 2020-04-20 MED ORDER — CHLORHEXIDINE GLUCONATE 0.12 % MT SOLN
OROMUCOSAL | Status: AC
  Administered 2020-04-20: 15 mL via OROMUCOSAL
  Filled 2020-04-20: qty 15

## 2020-04-20 MED ORDER — HEMOSTATIC AGENTS (NO CHARGE) OPTIME
TOPICAL | Status: DC | PRN
  Administered 2020-04-20: 1 via TOPICAL

## 2020-04-20 MED ORDER — LACTATED RINGERS IV SOLN: INTRAVENOUS | Status: DC

## 2020-04-20 MED ORDER — KETOROLAC TROMETHAMINE 15 MG/ML IJ SOLN
15.0000 mg | Freq: Four times a day (QID) | INTRAMUSCULAR | Status: AC
  Administered 2020-04-20 – 2020-04-21 (×4): 15 mg via INTRAVENOUS
  Filled 2020-04-20 (×4): qty 1

## 2020-04-20 MED ORDER — CEFAZOLIN SODIUM-DEXTROSE 1-4 GM/50ML-% IV SOLN
1.0000 g | Freq: Three times a day (TID) | INTRAVENOUS | Status: AC
  Administered 2020-04-20 – 2020-04-21 (×2): 1 g via INTRAVENOUS
  Filled 2020-04-20 (×2): qty 50

## 2020-04-20 MED ORDER — DEXMEDETOMIDINE HCL 200 MCG/2ML IV SOLN
INTRAVENOUS | Status: DC | PRN
  Administered 2020-04-20: 2 ug via INTRAVENOUS
  Administered 2020-04-20: 4 ug via INTRAVENOUS

## 2020-04-20 MED ORDER — EPHEDRINE SULFATE 50 MG/ML IJ SOLN
INTRAMUSCULAR | Status: DC | PRN
  Administered 2020-04-20: 10 mg via INTRAVENOUS

## 2020-04-20 MED ORDER — HYDROCODONE-ACETAMINOPHEN 10-325 MG PO TABS
2.0000 | ORAL_TABLET | ORAL | Status: DC | PRN
  Administered 2020-04-21 (×4): 2 via ORAL
  Filled 2020-04-20 (×4): qty 2

## 2020-04-20 MED ORDER — GABAPENTIN 300 MG PO CAPS
300.0000 mg | ORAL_CAPSULE | Freq: Every day | ORAL | Status: DC
  Administered 2020-04-20: 300 mg via ORAL
  Filled 2020-04-20: qty 1

## 2020-04-20 MED ORDER — ONDANSETRON HCL 4 MG/2ML IJ SOLN
INTRAMUSCULAR | Status: DC | PRN
  Administered 2020-04-20: 4 mg via INTRAVENOUS

## 2020-04-20 MED ORDER — CYCLOBENZAPRINE HCL 10 MG PO TABS
10.0000 mg | ORAL_TABLET | Freq: Three times a day (TID) | ORAL | Status: DC | PRN
  Administered 2020-04-21 (×2): 10 mg via ORAL
  Filled 2020-04-20 (×2): qty 1

## 2020-04-20 MED ORDER — PHENOL 1.4 % MT LIQD: 1.0000 | OROMUCOSAL | Status: DC | PRN

## 2020-04-20 MED ORDER — CHLORHEXIDINE GLUCONATE 0.12 % MT SOLN: 15.0000 mL | Freq: Once | OROMUCOSAL | Status: AC

## 2020-04-20 MED ORDER — SODIUM CHLORIDE 0.9 % IV SOLN: INTRAVENOUS | Status: DC | PRN

## 2020-04-20 MED ORDER — HYDROCODONE-ACETAMINOPHEN 5-325 MG PO TABS
1.0000 | ORAL_TABLET | ORAL | Status: DC | PRN
  Administered 2020-04-20 – 2020-04-22 (×5): 1 via ORAL
  Filled 2020-04-20 (×5): qty 1

## 2020-04-20 MED ORDER — MENTHOL 3 MG MT LOZG: 1.0000 | LOZENGE | OROMUCOSAL | Status: DC | PRN

## 2020-04-20 SURGICAL SUPPLY — 55 items
BAG DECANTER FOR FLEXI CONT (MISCELLANEOUS) ×3 IMPLANT
BAND RUBBER #18 3X1/16 STRL (MISCELLANEOUS) ×6 IMPLANT
BENZOIN TINCTURE PRP APPL 2/3 (GAUZE/BANDAGES/DRESSINGS) ×3 IMPLANT
BLADE CLIPPER SURG (BLADE) IMPLANT
BUR CUTTER 7.0 ROUND (BURR) ×3 IMPLANT
CANISTER SUCT 3000ML PPV (MISCELLANEOUS) ×3 IMPLANT
CARTRIDGE OIL MAESTRO DRILL (MISCELLANEOUS) ×1 IMPLANT
CLOSURE WOUND 1/2 X4 (GAUZE/BANDAGES/DRESSINGS) ×1
COVER WAND RF STERILE (DRAPES) IMPLANT
DECANTER SPIKE VIAL GLASS SM (MISCELLANEOUS) ×3 IMPLANT
DERMABOND ADVANCED (GAUZE/BANDAGES/DRESSINGS) ×2
DERMABOND ADVANCED .7 DNX12 (GAUZE/BANDAGES/DRESSINGS) ×1 IMPLANT
DIFFUSER DRILL AIR PNEUMATIC (MISCELLANEOUS) ×3 IMPLANT
DRAPE HALF SHEET 40X57 (DRAPES) IMPLANT
DRAPE LAPAROTOMY 100X72X124 (DRAPES) ×3 IMPLANT
DRAPE MICROSCOPE LEICA (MISCELLANEOUS) ×3 IMPLANT
DRAPE SURG 17X23 STRL (DRAPES) ×6 IMPLANT
DRSG OPSITE POSTOP 3X4 (GAUZE/BANDAGES/DRESSINGS) ×3 IMPLANT
DURAPREP 26ML APPLICATOR (WOUND CARE) ×3 IMPLANT
ELECT REM PT RETURN 9FT ADLT (ELECTROSURGICAL) ×3
ELECTRODE REM PT RTRN 9FT ADLT (ELECTROSURGICAL) ×1 IMPLANT
GAUZE 4X4 16PLY RFD (DISPOSABLE) IMPLANT
GAUZE SPONGE 4X4 12PLY STRL (GAUZE/BANDAGES/DRESSINGS) ×3 IMPLANT
GLOVE BIO SURGEON STRL SZ 6.5 (GLOVE) ×2 IMPLANT
GLOVE BIO SURGEON STRL SZ7.5 (GLOVE) ×3 IMPLANT
GLOVE BIO SURGEONS STRL SZ 6.5 (GLOVE) ×1
GLOVE BIOGEL PI IND STRL 6.5 (GLOVE) ×2 IMPLANT
GLOVE BIOGEL PI IND STRL 7.5 (GLOVE) ×4 IMPLANT
GLOVE BIOGEL PI INDICATOR 6.5 (GLOVE) ×4
GLOVE BIOGEL PI INDICATOR 7.5 (GLOVE) ×8
GLOVE ECLIPSE 7.0 STRL STRAW (GLOVE) ×9 IMPLANT
GLOVE ECLIPSE 9.0 STRL (GLOVE) ×3 IMPLANT
GLOVE EXAM NITRILE XL STR (GLOVE) IMPLANT
GLOVE SURG SS PI 6.5 STRL IVOR (GLOVE) ×3 IMPLANT
GLOVE SURG SS PI 7.0 STRL IVOR (GLOVE) ×3 IMPLANT
GOWN STRL REUS W/ TWL LRG LVL3 (GOWN DISPOSABLE) ×4 IMPLANT
GOWN STRL REUS W/ TWL XL LVL3 (GOWN DISPOSABLE) ×2 IMPLANT
GOWN STRL REUS W/TWL 2XL LVL3 (GOWN DISPOSABLE) IMPLANT
GOWN STRL REUS W/TWL LRG LVL3 (GOWN DISPOSABLE) ×12
GOWN STRL REUS W/TWL XL LVL3 (GOWN DISPOSABLE) ×6
KIT BASIN OR (CUSTOM PROCEDURE TRAY) ×3 IMPLANT
KIT TURNOVER KIT B (KITS) ×3 IMPLANT
NEEDLE HYPO 22GX1.5 SAFETY (NEEDLE) ×3 IMPLANT
NEEDLE SPNL 22GX3.5 QUINCKE BK (NEEDLE) ×3 IMPLANT
NS IRRIG 1000ML POUR BTL (IV SOLUTION) ×3 IMPLANT
OIL CARTRIDGE MAESTRO DRILL (MISCELLANEOUS) ×3
PACK LAMINECTOMY NEURO (CUSTOM PROCEDURE TRAY) ×3 IMPLANT
PAD ARMBOARD 7.5X6 YLW CONV (MISCELLANEOUS) ×9 IMPLANT
SPONGE SURGIFOAM ABS GEL SZ50 (HEMOSTASIS) ×3 IMPLANT
STRIP CLOSURE SKIN 1/2X4 (GAUZE/BANDAGES/DRESSINGS) ×2 IMPLANT
SUT VIC AB 2-0 CT1 18 (SUTURE) ×3 IMPLANT
SUT VIC AB 3-0 SH 8-18 (SUTURE) ×3 IMPLANT
TOWEL GREEN STERILE (TOWEL DISPOSABLE) ×3 IMPLANT
TOWEL GREEN STERILE FF (TOWEL DISPOSABLE) ×3 IMPLANT
WATER STERILE IRR 1000ML POUR (IV SOLUTION) ×3 IMPLANT

## 2020-04-20 NOTE — Anesthesia Procedure Notes (Signed)
Procedure Name: Intubation Date/Time: 04/20/2020 11:25 AM Performed by: Neldon Newport, CRNA Pre-anesthesia Checklist: Timeout performed, Patient being monitored, Suction available, Emergency Drugs available and Patient identified Patient Re-evaluated:Patient Re-evaluated prior to induction Oxygen Delivery Method: Circle system utilized Preoxygenation: Pre-oxygenation with 100% oxygen Induction Type: IV induction Ventilation: Mask ventilation without difficulty Laryngoscope Size: Glidescope and 3 Grade View: Grade I Tube type: Oral Tube size: 7.5 mm Number of attempts: 2 Placement Confirmation: ETT inserted through vocal cords under direct vision,  positive ETCO2 and breath sounds checked- equal and bilateral Secured at: 23 cm Tube secured with: Tape Dental Injury: Teeth and Oropharynx as per pre-operative assessment

## 2020-04-20 NOTE — Anesthesia Preprocedure Evaluation (Addendum)
Anesthesia Evaluation  Patient identified by MRN, date of birth, ID band Patient awake    Reviewed: Allergy & Precautions, NPO status , Patient's Chart, lab work & pertinent test results  Airway Mallampati: I  TM Distance: >3 FB Neck ROM: Full    Dental no notable dental hx. (+) Teeth Intact, Dental Advisory Given   Pulmonary neg pulmonary ROS,    Pulmonary exam normal breath sounds clear to auscultation       Cardiovascular hypertension, Pt. on medications + CAD  Normal cardiovascular exam Rhythm:Regular Rate:Normal     Neuro/Psych  Neuromuscular disease negative neurological ROS  negative psych ROS   GI/Hepatic negative GI ROS, Neg liver ROS,   Endo/Other  negative endocrine ROS  Renal/GU Renal InsufficiencyRenal diseaseLab Results      Component                Value               Date                      CREATININE               1.50 (H)            04/19/2020                BUN                      16                  04/19/2020                NA                       138                 04/19/2020                K                        4.4                 04/19/2020                CL                       107                 04/19/2020                CO2                      21 (L)              04/19/2020                Musculoskeletal  (+) Arthritis ,   Abdominal   Peds  Hematology Lab Results      Component                Value               Date                      WBC  8.6                 04/19/2020                HGB                      13.7                04/19/2020                HCT                      39.8                04/19/2020                MCV                      88.2                04/19/2020                PLT                      197                 04/19/2020             Anesthesia Other Findings   Reproductive/Obstetrics                             Anesthesia Physical Anesthesia Plan  ASA: II  Anesthesia Plan: General   Post-op Pain Management:    Induction: Intravenous  PONV Risk Score and Plan: 3 and Treatment may vary due to age or medical condition, Ondansetron, Dexamethasone and Scopolamine patch - Pre-op  Airway Management Planned: Oral ETT and Video Laryngoscope Planned  Additional Equipment:   Intra-op Plan:   Post-operative Plan: Extubation in OR  Informed Consent: I have reviewed the patients History and Physical, chart, labs and discussed the procedure including the risks, benefits and alternatives for the proposed anesthesia with the patient or authorized representative who has indicated his/her understanding and acceptance.     Dental advisory given  Plan Discussed with:   Anesthesia Plan Comments:         Anesthesia Quick Evaluation

## 2020-04-20 NOTE — Brief Op Note (Signed)
04/19/2020 - 04/20/2020  12:59 PM  PATIENT:  [name]  85 y.o. male  PRE-OPERATIVE DIAGNOSIS:  Stenosis  POST-OPERATIVE DIAGNOSIS:  Stenosis  PROCEDURE:  Procedure(s): Laminectomy and Foraminotomy - right - Lumbar five-Sacral one (Right)  SURGEON:  Surgeon(s) and Role:    [name], MD - Primary  PHYSICIAN ASSISTANT:   ASSISTANTS: Ronnie Derby   ANESTHESIA:   general  EBL:  50 mL   BLOOD ADMINISTERED:none  DRAINS: none   LOCAL MEDICATIONS USED:  MARCAINE     SPECIMEN:  No Specimen  DISPOSITION OF SPECIMEN:  N/A  COUNTS:  YES  TOURNIQUET:  * No tourniquets in log *  DICTATION: .Dragon Dictation  PLAN OF CARE: Admit to inpatient   PATIENT DISPOSITION:  PACU - hemodynamically stable.   Delay start of Pharmacological VTE agent (>24hrs) due to surgical blood loss or risk of bleeding: yes

## 2020-04-20 NOTE — Op Note (Signed)
Date of procedure: 04/20/2020  Date of dictation: Same  Service: Neurosurgery  Preoperative diagnosis: Right L5-S1 foraminal stenosis with radiculopathy  Postoperative diagnosis: Same  Procedure Name: Right L5-S1 laminotomy and foraminotomy  Surgeon:Ricki Vanhandel A.Curley Hogen, M.D.  Asst. Surgeon: Leafy Kindle, NP  Anesthesia: General  Indication: Patient is an 85 year old male with severe right lower extremity pain consistent with right-sided L5 radiculopathy which failed conservative management. We will keep him straight evidence of significant multilevel lumbar disc degeneration with marked disc space collapse and severe foraminal stenosis on the right L5 . Patient presents now for decompressive surgery in hopes of improving symptoms.  Operative note: After induction of anesthesia, patient positioned prone on the Wilson frame properly padded. Lumbar region prepped and draped sterilely. Incision made along L5-S1. Dissection performed on the right. Retractor placed. X-ray taken. Level confirmed. Laminotomy then performed with high-speed drill and Kerrison rongeurs to remove the entire right hemilamina L5: The medial aspect of the L5-S1 facet joint the superior rim of the S1 lamina. Appointment elevated and resected. The laminotomy was then further extended the L4-5 interspace. Lateral recess decompressed at L4-5 carefully skeletonized the right L5 nerve root. The right L5 nerve was tracked from with his medial lateral recess on the right L4-5 past the pedicle of L5 and decompressed during the entirety neuroforamen. At this point a very thorough decompression and management. The disc spaces at both levels were inspected and found to be free from significant residual compression. The wound was irrigated with antibiotic solution. Gelfoam was placed topically for hemostasis. Wound was closed in layers with Vicryl sutures. Steri-Strips and sterile dressing applied. Apparent complications. Patient tolerated  the procedure well and he returns today for postop.

## 2020-04-20 NOTE — Progress Notes (Signed)
Pt no longer tearful.  Taking fluids well.  Continues to deny nausea, numbness or tingling.  Wife at bedside.  Pt ambulated to bathroom with standby assist.  Dressing CDI.

## 2020-04-20 NOTE — Transfer of Care (Signed)
Immediate Anesthesia Transfer of Care Note  Patient: Dennis Carey  Procedure(s) Performed: Laminectomy and Foraminotomy - right - Lumbar five-Sacral one (Right Back)  Patient Location: PACU  Anesthesia Type:General  Level of Consciousness: awake, alert  and oriented  Airway & Oxygen Therapy: Patient Spontanous Breathing and Patient connected to nasal cannula oxygen  Post-op Assessment: Report given to RN, Post -op Vital signs reviewed and stable and Patient moving all extremities X 4  Post vital signs: Reviewed and stable  Last Vitals:  Vitals Value Taken Time  BP 143/69 04/20/20 1313  Temp    Pulse 81 04/20/20 1315  Resp 20 04/20/20 1315  SpO2 99 % 04/20/20 1315  Vitals shown include unvalidated device data.  Last Pain:  Vitals:   04/20/20 1030  TempSrc:   PainSc: 1          Complications: No complications documented.

## 2020-04-20 NOTE — Plan of Care (Signed)
  Problem: Clinical Measurements: Goal: Will remain free from infection Outcome: Progressing   Problem: Clinical Measurements: Goal: Ability to maintain clinical measurements within normal limits will improve Outcome: Progressing   Problem: Health Behavior/Discharge Planning: Goal: Ability to manage health-related needs will improve Outcome: Progressing   Problem: Education: Goal: Knowledge of General Education information will improve Description: Including pain rating scale, medication(s)/side effects and non-pharmacologic comfort measures Outcome: Progressing   

## 2020-04-20 NOTE — Anesthesia Postprocedure Evaluation (Signed)
Anesthesia Post Note  Patient: Dennis Carey  Procedure(s) Performed: Laminectomy and Foraminotomy - right - Lumbar five-Sacral one (Right Back)     Patient location during evaluation: PACU Anesthesia Type: General Level of consciousness: awake and alert Pain management: pain level controlled Vital Signs Assessment: post-procedure vital signs reviewed and stable Respiratory status: spontaneous breathing, nonlabored ventilation, respiratory function stable and patient connected to nasal cannula oxygen Cardiovascular status: blood pressure returned to baseline and stable Postop Assessment: no apparent nausea or vomiting Anesthetic complications: no   No complications documented.  Last Vitals:  Vitals:   04/20/20 1315 04/20/20 1330  BP: (!) 143/69 (!) 142/68  Pulse: 80 82  Resp: 16 16  Temp: 36.7 C   SpO2: 99% 99%    Last Pain:  Vitals:   04/20/20 1315  TempSrc:   PainSc: Kay

## 2020-04-20 NOTE — Progress Notes (Signed)
Pt returned from PACU at this time.  Wife at bedside.  He is very emotional and tearful.  States he is in no pain, but thinks the medication is making him tearful.  Small honeycomb dressing to lower back is CDI.  Denies nausea, numbness or tingling.  Good strength in all extremities.  VSS.

## 2020-04-20 NOTE — Progress Notes (Signed)
Initial Nutrition Assessment  DOCUMENTATION CODES:   Not applicable  INTERVENTION:   Monitor for diet advancement and adequacy; supplement diet as appropriate.    NUTRITION DIAGNOSIS:   Inadequate oral intake related to inability to eat as evidenced by NPO status.  GOAL:   Patient will meet greater than or equal to 90% of their needs  MONITOR:   Diet advancement, I & O's  REASON FOR ASSESSMENT:   Malnutrition Screening Tool    ASSESSMENT:   Pt with PMH of CAD, CKD stage III, HTN, and HLD admitted with R L5 radiculopathy secondary to R L5-S1 foraminal stenosis.   Pt out of room for surgery.  Per chart review pt's weight has decreased by 15 kg since 2019 but unable to confirm.  Per H&P pt with hx of pain for last 3 weeks.   Medications reviewed  Labs reviewed     Diet Order:   Diet Order            Diet regular Room service appropriate? Yes; Fluid consistency: Thin  Diet effective now                 EDUCATION NEEDS:   No education needs have been identified at this time  Skin:  Skin Assessment: Reviewed RN Assessment  Last BM:  6/10  Height:   Ht Readings from Last 1 Encounters:  04/06/20 5' 6.5" (1.689 m)    Weight:   Wt Readings from Last 1 Encounters:  04/06/20 56 kg    Ideal Body Weight:  65.9 kg  BMI:  There is no height or weight on file to calculate BMI.  Estimated Nutritional Needs:   Kcal:  1600-1800  Protein:  70-80 grams  Fluid:  > 1.6L/day  Trevell Pariseau P., RD, LDN, CNSC See AMiON for contact information

## 2020-04-21 ENCOUNTER — Encounter (HOSPITAL_COMMUNITY): Payer: Self-pay | Admitting: Neurosurgery

## 2020-04-21 MED ORDER — SENNOSIDES-DOCUSATE SODIUM 8.6-50 MG PO TABS
1.0000 | ORAL_TABLET | Freq: Two times a day (BID) | ORAL | Status: DC
  Administered 2020-04-21 – 2020-04-22 (×3): 1 via ORAL
  Filled 2020-04-21 (×2): qty 1

## 2020-04-21 MED ORDER — BISACODYL 5 MG PO TBEC: 5.0000 mg | DELAYED_RELEASE_TABLET | Freq: Every day | ORAL | Status: DC | PRN

## 2020-04-21 NOTE — Plan of Care (Signed)
  Problem: Clinical Measurements: Goal: Will remain free from infection Outcome: Progressing   Problem: Clinical Measurements: Goal: Ability to maintain clinical measurements within normal limits will improve Outcome: Progressing   Problem: Health Behavior/Discharge Planning: Goal: Ability to manage health-related needs will improve Outcome: Progressing   Problem: Education: Goal: Knowledge of General Education information will improve Description: Including pain rating scale, medication(s)/side effects and non-pharmacologic comfort measures Outcome: Progressing   

## 2020-04-21 NOTE — Progress Notes (Signed)
Patient ID: Dennis Carey, male   DOB: [birth year], 85 y.o.   MRN: [id number] BP (!) 100/55 (BP Location: Left Arm)   Pulse 67   Temp 97.9 F (36.6 C) (Oral)   Resp 16   SpO2 98%  Alert and oriented x 4, speech is clear and fluent Wound is clean, dry, without signs of infection.  Doing better

## 2020-04-21 NOTE — Evaluation (Addendum)
Occupational Therapy Evaluation Patient Details Name: Dennis Carey MRN: [id number] DOB: 09-Aug-1935 Today's Date: 04/21/2020    History of Present Illness Pt is a 85 y.o. 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 Prior to surgery pt reports pain in R LE. s/p laminectomy and foraminotomy R L5-S1 on 04/20/2020.   Clinical Impression   PTA pt was independent in all ADLs and driving. Pt limited by problem list below. Requires supervision and max cues to adhere to back precautions during grooming and UB ADLs. Pt requires Max A with LB ADLs but pt and wife report that wife will assist with those activities. Pt requires min A and handhold assist when ambulating. Pt noted leaning forward multiple times and grabbing for environmental supports throughout session and needed max verbal cues to correct posture. Educated on not bending while grooming at sink, pillow positioning when sleeping, and precautions. Pt able to recall 2/3 precautions with environmental cue from wife to remember lifting. Discussed utilizing RW and shower seat when returning home for safety - pt and wife agreeable. Notified RN for RW for mobility and PT referral. Believe pt will benefit from skilled OT services and 24/7 supervision upon dc.     Follow Up Recommendations  Supervision/Assistance - 24 hour    Equipment Recommendations  Tub/shower seat    Recommendations for Other Services PT consult     Precautions / Restrictions Precautions Precautions: Back Precaution Booklet Issued: Yes (comment) Precaution Comments: discussed BLT precautions and reviewed with pt with 3/3 recall. Administered booklet with information about log roll and positioning during sleeping Restrictions Weight Bearing Restrictions: No      Mobility Bed Mobility Overal bed mobility: Needs Assistance Bed Mobility: Sit to Sidelying;Sidelying to Sit   Sidelying to sit:  Supervision     Sit to sidelying: Supervision General bed mobility comments: Pt required supervision with min cues to ensure he was following correct log roll technique to get in and out of bed. Educated on pillow placement while sleeping.   Transfers Overall transfer level: Needs assistance   Transfers: Sit to/from Stand Sit to Stand: Min assist (with hand hold assist)         General transfer comment: Pt ambulated in room with min A and hand hold assist. Pt leaned forward and reached for environmental supports. Believe pt would benefit from B UE support from walker for safety and adherance to back precautions. Notified RN about mobility status and need for RW during ambulation.     Balance Overall balance assessment: Needs assistance Sitting-balance support: Feet supported Sitting balance-Leahy Scale: Good Sitting balance - Comments: Pt seen sitting EOB upon entering room  Postural control: Other (comment) (Anterior lean ) Standing balance support: Single extremity supported;During functional activity Standing balance-Leahy Scale: Fair Standing balance comment: pt requires B UE support while ambulating to adjust posture                           ADL either performed or assessed with clinical judgement   ADL Overall ADL's : Needs assistance/impaired     Grooming: Oral care;Min guard;Standing;Cueing for sequencing;Cueing for safety Grooming Details (indicate cue type and reason): Performed grooming at sink. Pt needed max verbal cues for maintaining upright posture and not leaning forward. Educated pt on not leaning at sink and utilizing cups to maintain precautions Upper Body Bathing: Supervision/ safety;Sitting Upper Body Bathing Details (indicate cue type and reason): supervision to  ensure adherance to precautions Lower Body Bathing: Maximal assistance;Sitting/lateral leans Lower Body Bathing Details (indicate cue type and reason): Discussed LB bathing with pt and  reported that wife can help due to being unable to complete figure 4 position. Upper Body Dressing : Supervision/safety;Sitting Upper Body Dressing Details (indicate cue type and reason): supervision to maintain adherance to precautions Lower Body Dressing: Sit to/from stand;Cueing for back precautions;Maximal assistance Lower Body Dressing Details (indicate cue type and reason): Pt reports that wife will assist with LB dressing due to being unable to complete figure 4 position. Toilet Transfer: Minimal assistance;Ambulation;Regular Glass blower/designer Details (indicate cue type and reason): Pt required min A with hand hold A while ambulating Toileting- Clothing Manipulation and Hygiene: Supervision/safety Toileting - Clothing Manipulation Details (indicate cue type and reason): Supervision to adhere to back precautions   Tub/Shower Transfer Details (indicate cue type and reason): deferred Functional mobility during ADLs: Minimal assistance;Cueing for safety General ADL Comments: Pt requires max verbal cues for posture. Pt leans forward in ambulation and during functional tasks. Pt needs min verbal cues to adhere to back precautions during ADLs. Pt and wife report that she will assist with LB dressing and bathing.     Vision Baseline Vision/History: Wears glasses Wears Glasses: Reading only Patient Visual Report: No change from baseline       Perception     Praxis      Pertinent Vitals/Pain Pain Assessment: 0-10 Pain Score: 7  Pain Location: back and hip Pain Descriptors / Indicators: Guarding;Grimacing;Discomfort;Operative site guarding Pain Intervention(s): Monitored during session     Hand Dominance Right   Extremity/Trunk Assessment Upper Extremity Assessment Upper Extremity Assessment: Overall WFL for tasks assessed       Cervical / Trunk Assessment Cervical / Trunk Assessment: Normal   Communication Communication Communication: No difficulties   Cognition  Arousal/Alertness: Awake/alert Behavior During Therapy: WFL for tasks assessed/performed Overall Cognitive Status: Impaired/Different from baseline Area of Impairment: Safety/judgement                         Safety/Judgement: Decreased awareness of safety     General Comments: Pt able to recall 2/3 precautions with environmental cueing from wife for lifting   General Comments  Pt's wife present for eval. Noted pt ambulation with min A and single hand hold A was not enough support. Notified RN of pt mobility status and need for RW to help pt adhere to back precautions and safety.     Exercises     Shoulder Instructions      Home Living Family/patient expects to be discharged to:: Private residence Living Arrangements: Spouse/significant other Available Help at Discharge: Family Type of Home: House Home Access: Stairs to enter Technical brewer of Steps: 8   Home Layout: One level     Bathroom Shower/Tub: Tub only   Biochemist, clinical: [place] - single point;Grab bars - tub/shower   Additional Comments: Pt wife confirmed home set-up and support      Prior Functioning/Environment Level of Independence: Independent        Comments: Driving and working in [place] since retirement        OT Problem List: Decreased strength;Impaired balance (sitting and/or standing);Decreased safety awareness;Decreased knowledge of use of DME or AE;Decreased knowledge of precautions;Pain      OT Treatment/Interventions: Self-care/ADL training;Therapeutic exercise;Energy conservation;DME and/or AE instruction;Therapeutic activities;Patient/family education;Balance training    OT Goals(Current goals can be  found in the care plan section) Acute Rehab OT Goals Patient Stated Goal: go home safely OT Goal Formulation: With patient/family Time For Goal Achievement: 05/05/20 Potential to Achieve Goals: Good ADL Goals Pt Will Perform  Grooming: standing;with modified independence Pt Will Perform Upper Body Bathing: sitting;with modified independence Pt Will Transfer to Toilet: with modified independence;ambulating Pt Will Perform Tub/Shower Transfer: Tub transfer;with modified independence;shower seat;grab bars;ambulating Additional ADL Goal #1: Pt will recall 3/3 back precautions without environmental cues to improve safety with ADLs.  OT Frequency: Min 2X/week   Barriers to D/C:            Co-evaluation              AM-PAC OT "6 Clicks" Daily Activity     Outcome Measure Help from another person eating meals?: None Help from another person taking care of personal grooming?: A Little Help from another person toileting, which includes using toliet, bedpan, or urinal?: A Little Help from another person bathing (including washing, rinsing, drying)?: A Lot Help from another person to put on and taking off regular upper body clothing?: A Little Help from another person to put on and taking off regular lower body clothing?: A Lot 6 Click Score: 17   End of Session Equipment Utilized During Treatment: Gait belt Nurse Communication: Mobility status;Other (comment) (need for RW and ambulation within room. PT referral)  Activity Tolerance: Patient tolerated treatment well Patient left: in chair;with call bell/phone within reach;with family/visitor present  OT Visit Diagnosis: Unsteadiness on feet (R26.81);Other abnormalities of gait and mobility (R26.89);Muscle weakness (generalized) (M62.81);Pain Pain - Right/Left: Right Pain - part of body: Hip (back (incisional))                Time: 5027-7412 OT Time Calculation (min): 25 min Charges:  OT General Charges $OT Visit: 1 Visit OT Evaluation $OT Eval Moderate Complexity: 1 Mod OT Treatments $Self Care/Home Management : 8-22 mins  Hrishikesh Hoeg/OTS  Jamen Loiseau 04/21/2020, 2:03 PM

## 2020-04-21 NOTE — Progress Notes (Signed)
Pt ambulated to the bathroom 3 times during the shift, requested to walk on the hallway during the day. Tolerated well

## 2020-04-21 NOTE — Progress Notes (Signed)
Patient very uncomfortable wants to lay back down from sitting at side of bed.  Reports muscle spasms, pain, and chills presently.  Without fever, dressing clean dry and intact; no drainage. Patient assisted back into bed; pain assessed is 10/10; medicated prn with Vicodin; last flexeril was 0300; will monitor for relief; patient positioned for comfort in bed.

## 2020-04-22 NOTE — Progress Notes (Signed)
Occupational Therapy Treatment Patient Details Name: Dennis Carey MRN: [id number] DOB: 1935-11-06 Today's Date: 04/22/2020    History of present illness Pt is a 85 y.o. 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 Prior to surgery pt reports pain in R LE. s/p laminectomy and foraminotomy R L5-S1 on 04/20/2020.   OT comments  Pt instructed in safe method for LB ADLs and functional transfers.  He requires supervision for ADLs and is now able to access feet by performing figure 4 posture.  He requires min cues to adhere to precautions.  Wife is very supportive and independently cues pt as needed.   Follow Up Recommendations  Supervision/Assistance - 24 hour    Equipment Recommendations  None recommended by OT    Recommendations for Other Services      Precautions / Restrictions Precautions Precautions: Back Precaution Booklet Issued: Yes (comment) (already given) Precaution Comments: Pt able to state 3/3 precautions independently, but requires min cues to adhere to them during functional tasks  Required Braces or Orthoses:  (no brace)       Mobility Bed Mobility                  Transfers Overall transfer level: Needs assistance Equipment used: Rolling walker (2 wheeled) Transfers: Sit to/from [facility] Sit to Stand: Supervision Stand pivot transfers: Supervision       General transfer comment: verbal cues for walker safety     Balance Overall balance assessment: Mild deficits observed, not formally tested                                         ADL either performed or assessed with clinical judgement   ADL Overall ADL's : Needs assistance/impaired       Grooming Details (indicate cue type and reason): reviewed safe technique for oral care and shaving with both pt and wife  Upper Body Bathing: Supervision/ safety;Sitting   Lower Body Bathing:  Supervison/ safety;Sit to/from stand Lower Body Bathing Details (indicate cue type and reason): discussed use of LH sponge or brush  Upper Body Dressing : Set up;Sitting   Lower Body Dressing: Supervision/safety;Sit to/from stand Lower Body Dressing Details (indicate cue type and reason): Pt able to perform figure 4.  Verbal cues to problem solve how to don shoes Toilet Transfer: Supervision/safety;Ambulation;Comfort height toilet;Grab bars;RW   Toileting- Clothing Manipulation and Hygiene: Supervision/safety;Sit to/from stand   Tub/ Shower Transfer: Tub transfer;Min guard;Ambulation;Rolling walker Tub/Shower Transfer Details (indicate cue type and reason): Pt reports he has grab bars in shower.  Cautioned him against reaching across tub to access the back rail as that would cause him to bend.  Reivewed safe technique with pt, he deferred practice  Functional mobility during ADLs: Supervision/safety;Rolling walker       Vision       Perception     Praxis      Cognition Arousal/Alertness: Awake/alert Behavior During Therapy: WFL for tasks assessed/performed Overall Cognitive Status: Within Functional Limits for tasks assessed                                 General Comments: occasional verbal cues for precautions         Exercises     Shoulder Instructions  General Comments wife present.  She is able to independently cue pt on precautions and safety.  Pt instructed to properly retrieve items from closet     Pertinent Vitals/ Pain       Pain Assessment: 0-10 Pain Score: 3  Pain Location: back, leg Pain Descriptors / Indicators: Guarding;Grimacing;Discomfort;Operative site guarding Pain Intervention(s): Monitored during session  Home Living                                          Prior Functioning/Environment              Frequency  Min 2X/week        Progress Toward Goals  OT Goals(current goals can now be found in  the care plan section)  Progress towards OT goals: Progressing toward goals  Acute Rehab OT Goals Patient Stated Goal: to go home   Plan Discharge plan remains appropriate;Equipment recommendations need to be updated    Co-evaluation                 AM-PAC OT "6 Clicks" Daily Activity     Outcome Measure   Help from another person eating meals?: None Help from another person taking care of personal grooming?: A Little Help from another person toileting, which includes using toliet, bedpan, or urinal?: A Little Help from another person bathing (including washing, rinsing, drying)?: A Little Help from another person to put on and taking off regular upper body clothing?: A Little Help from another person to put on and taking off regular lower body clothing?: A Little 6 Click Score: 19    End of Session Equipment Utilized During Treatment: Gait belt;Rolling walker  OT Visit Diagnosis: Unsteadiness on feet (R26.81);Other abnormalities of gait and mobility (R26.89);Muscle weakness (generalized) (M62.81);Pain Pain - part of body:  (back )   Activity Tolerance Patient tolerated treatment well   Patient Left in chair;with call bell/phone within reach;with family/visitor present   Nurse Communication Mobility status        Time: 0539-7673 OT Time Calculation (min): 24 min  Charges: OT General Charges $OT Visit: 1 Visit OT Treatments $Self Care/Home Management : 23-37 mins  Nilsa Nutting OTR/L Acute Rehabilitation Services Pager [phone] Office [phone]    Lucille Passy M 04/22/2020, 2:32 PM

## 2020-04-22 NOTE — Discharge Summary (Signed)
   Physician Discharge Summary  Patient ID: Dennis Carey MRN: [id number] DOB/AGE: Feb 02, 1935 85 y.o.  Admit date: 04/19/2020 Discharge date: 04/22/2020  Admission Diagnoses: Lumbar stenosis with radiculopathy  Discharge Diagnoses: Same Active Problems:   Lumbar radiculopathy   Discharged Condition: Stable  Hospital Course:  [name] is a 85 y.o. male who presented to the clinic with lumbar radiculopathy and lumbar stenosis. The patient was admitted for elective Right L5-S1 laminotomy and foraminotomy which was done without complication. On POD#1 the patient was at his neurologic baseline, reporting relief leg pain. Back pain was controlled with oral medication, he was ambulating without difficulty, voiding normally, and tolerating diet.  Treatments: Surgery - R L5-S1 foraminotomy  Discharge Exam: Blood pressure 127/73, pulse 74, temperature 97.8 F (36.6 C), temperature source Oral, resp. rate 16, SpO2 98 %. Awake, alert, oriented Speech fluent, appropriate CN grossly intact 5/5 BUE/BLE Wound c/d/i  Follow-up: Follow-up in my office [facility] [phone]) in 2-3 weeks  Disposition: Discharge disposition: 01-Home or Self Care        Allergies as of 04/22/2020      Reactions   Sulfa Antibiotics Hives      Medication List    TAKE these medications   aspirin 81 MG tablet Take 81 mg by mouth daily.   chlorhexidine 0.12 % solution Commonly known as: PERIDEX 15 mLs by Mouth Rinse route in the morning.   ezetimibe 10 MG tablet Commonly known as: ZETIA Take 10 mg by mouth at bedtime.   gabapentin 300 MG capsule Commonly known as: NEURONTIN Take 300 mg by mouth 3 (three) times daily.   Oxycodone HCl 10 MG Tabs Take 10 mg by mouth every 4 (four) hours as needed for pain.   rosuvastatin 20 MG tablet Commonly known as: CRESTOR Take 20 mg by mouth at bedtime.            Durable Medical Equipment  (From admission,  onward)         Start     Ordered   04/22/20 1144  For home use only DME Walker rolling  Once       Question Answer Comment  Walker: With 5 Inch Wheels   Patient needs a walker to treat with the following condition Weakness      04/22/20 1144          Follow-up Information    [facility] Follow up.   Why: They will call you tomorrow to schedule your first home visit Contact information: [place] [address] [phone]        [name], MD Follow up in 2 week(s).   Specialty: Neurosurgery Contact information: [address] [place] [address] [phone]               Signed: [name] 04/22/2020, 1:54 PM

## 2020-04-22 NOTE — Progress Notes (Signed)
Patient ready for discharge to home; walker delivered to the room for home use; discharge instructions given and reviewed with patient and his wife; patient discharged out via wheelchair; accompanied home by his wife.  All belongings returned and walker sent home with patient. Vicodin Rx is being called to [facility] by Dr.Najae per patient request since their pharmacy is closed today.

## 2020-04-22 NOTE — TOC Transition Note (Signed)
Transition of Care [facility]) - CM/SW Discharge Note   Patient Details  Name: Dennis Carey MRN: [id number] Date of Birth: [birth year]  Transition of Care [facility]) CM/SW Contact:  Carles Collet, RN Phone Number: 04/22/2020, 11:44 AM   Clinical Narrative:   Damaris Schooner w patient's wife. RW requested, tub sheat declined. RW to be delivered to the room today. Discussed [place] providers. Wife agrees to top rated company, [facility] accepted referral. [place].     Final next level of care: [place] Barriers to Discharge: No Barriers Identified   Patient Goals and CMS Choice Patient states their goals for this hospitalization and ongoing recovery are:: to go home CMS Medicare.gov Compare Post Acute Care list provided to:: Other (Comment Required) Choice offered to / list presented to : Spouse  Discharge Placement                       Discharge Plan and Services                DME Arranged: Walker rolling DME Agency: [employer] Date DME Agency Contacted: 04/22/20 Time DME Agency Contacted: 2010 Representative spoke with at DME Agency: [place]: PT [place]: [place] Date [place]: 04/22/20 Time [place]: 1143 Representative spoke with at [place]: [place] Determinants of Health ([place]) Interventions     Readmission Risk Interventions No flowsheet data found.

## 2020-04-22 NOTE — Evaluation (Signed)
Physical Therapy Evaluation Patient Details Name: Dennis Carey MRN: [id number] DOB: 08-19-1935 Today's Date: 04/22/2020   History of Present Illness  Pt is a 85 y.o. 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 Prior to surgery pt reports pain in R LE. s/p laminectomy and foraminotomy R L5-S1 on 04/20/2020.  Clinical Impression  Patient is s/p above surgery resulting in functional limitations due to the deficits listed below (see PT Problem List). Patient very groggy (likely from pain meds per his and wife's description). He agrees with need for RW to improve his safety as he is unsteady and with antalgic gait causing incr back pain when walking without RW. Incr time spent educating on transfers and safe use of RW and back precautions.  Patient will benefit from skilled HHPT to increase their independence and safety with mobility and to progress to not using RW (pt's goal).    Follow Up Recommendations Home health PT;Supervision/Assistance - 24 hour (24/7 due to cognition)    Equipment Recommendations  Rolling walker with 5" wheels    Recommendations for Other Services       Precautions / Restrictions Precautions Precautions: Back Precaution Booklet Issued:  (already given) Precaution Comments: pt remembered 2/3 precautions (not lift) but able to recall with hint of BLT; at end of session, pt was the same (2/3 recalled) Required Braces or Orthoses:  (no brace)      Mobility  Bed Mobility Overal bed mobility: Needs Assistance Bed Mobility: Rolling;Sidelying to Sit Rolling: Supervision Sidelying to sit: Supervision       General bed mobility comments: vc for technique to prevent twisting;  Transfers Overall transfer level: Needs assistance Equipment used: Rolling walker (2 wheeled) Transfers: Sit to/from Stand Sit to Stand: Min guard         General transfer comment: repeated from bed x 2 (including  raised to simulate home bed); from standard toilet, from chair with armrests x 2; consistent need for cues for proper hand placement for safest use of RW  Ambulation/Gait Ambulation/Gait assistance: Min guard;Min assist Gait Distance (Feet): 100 Feet (seated rest, 150) Assistive device: Rolling walker (2 wheeled);None Gait Pattern/deviations: Step-through pattern;Decreased stride length;Antalgic Gait velocity: decr, able to incr closer to his normal pace with RW   General Gait Details: with RW, no limp noted, vc for proximity to RW; initially assist to turn RW for sharp turns; attempted without RW with antalgic and unsteady gait noted  Stairs Stairs: Yes Stairs assistance: Min guard Stair Management: One rail Right;Step to pattern;Forwards Number of Stairs: 5 General stair comments: reviewed sequencing for painful RLE, however when pt altered pattern ascending and descending he did fine; Wife educated on assisting with moving RW  Wheelchair Mobility    Modified Rankin (Stroke Patients Only)       Balance Overall balance assessment: Mild deficits observed, not formally tested                                           Pertinent Vitals/Pain Pain Assessment: 0-10 Pain Score: 3  Pain Location: back, leg Pain Descriptors / Indicators: Guarding;Grimacing;Discomfort;Operative site guarding Pain Intervention(s): Limited activity within patient's tolerance;Monitored during session;Premedicated before session    Home Living Family/patient expects to be discharged to:: Private residence Living Arrangements: Spouse/significant other Available Help at Discharge: Family Type of Home: House Home Access: Stairs to enter  Entrance Stairs-Rails: Right Entrance Stairs-Number of Steps: 8 Home Layout: Two level;Able to live on main level with bedroom/bathroom Home Equipment: Cane - single point;Grab bars - tub/shower      Prior Function Level of Independence: Independent                Hand Dominance   Dominant Hand: Right    Extremity/Trunk Assessment   Upper Extremity Assessment Upper Extremity Assessment: Overall WFL for tasks assessed    Lower Extremity Assessment Lower Extremity Assessment: RLE deficits/detail RLE Deficits / Details: reports continued/improved pain below his knee (as prior to surgery)    Cervical / Trunk Assessment Cervical / Trunk Assessment: Normal  Communication   Communication: No difficulties  Cognition Arousal/Alertness: Suspect due to medications (groggy) Behavior During Therapy: WFL for tasks assessed/performed Overall Cognitive Status: Impaired/Different from baseline Area of Impairment: Memory                     Memory: Decreased short-term memory;Decreased recall of precautions         General Comments: stated he has 1 step to get into his bed; wife reported he does not and not sure what he is talking about; elevated bed to simulate home and pt able to sit at EOB and scoot back with strong use of UEs      General Comments General comments (skin integrity, edema, etc.): Wife present and recalls precautions independently; cuing pt appropriately.     Exercises     Assessment/Plan    PT Assessment All further PT needs can be met in the next venue of care  PT Problem List Decreased balance;Decreased mobility;Decreased cognition;Decreased knowledge of use of DME;Decreased knowledge of precautions;Pain       PT Treatment Interventions      PT Goals (Current goals can be found in the Care Plan section)  Acute Rehab PT Goals Patient Stated Goal: be safe when goes home PT Goal Formulation: All assessment and education complete, DC therapy    Frequency     Barriers to discharge        Co-evaluation               AM-PAC PT "6 Clicks" Mobility  Outcome Measure Help needed turning from your back to your side while in a flat bed without using bedrails?: A Little Help needed  moving from lying on your back to sitting on the side of a flat bed without using bedrails?: A Little Help needed moving to and from a bed to a chair (including a wheelchair)?: A Little Help needed standing up from a chair using your arms (e.g., wheelchair or bedside chair)?: A Little Help needed to walk in hospital room?: A Little Help needed climbing 3-5 steps with a railing? : A Little 6 Click Score: 18    End of Session Equipment Utilized During Treatment: Gait belt Activity Tolerance: Patient tolerated treatment well Patient left: in chair;with call bell/phone within reach;with family/visitor present Nurse Communication: Mobility status;Other (comment) (need RW, HHPT) PT Visit Diagnosis: Pain;Difficulty in walking, not elsewhere classified (R26.2) Pain - Right/Left: Right Pain - part of body: Leg    Time: 1018-1100 PT Time Calculation (min) (ACUTE ONLY): 42 min   Charges:   PT Evaluation $PT Eval Low Complexity: 1 Low PT Treatments $Gait Training: 8-22 mins $Self Care/Home Management: 8-22         Arby Barrette, PT Pager [phone]   Rexanne Mano 04/22/2020, 11:18 AM

## 2020-04-22 NOTE — Discharge Instructions (Signed)
Can remove transparent dressing and shower 48 hours after surgery Walk as much as possible No heavy lifting >10 lbs No excessive bending/twisting at the waist  Pick up prescription for Vicodin at your [facility]

## 2020-04-25 DIAGNOSIS — Z9181 History of falling: Secondary | ICD-10-CM | POA: Diagnosis not present

## 2020-04-25 DIAGNOSIS — Z4789 Encounter for other orthopedic aftercare: Secondary | ICD-10-CM | POA: Diagnosis not present

## 2020-04-25 DIAGNOSIS — M48061 Spinal stenosis, lumbar region without neurogenic claudication: Secondary | ICD-10-CM | POA: Diagnosis not present

## 2020-04-25 DIAGNOSIS — M5416 Radiculopathy, lumbar region: Secondary | ICD-10-CM | POA: Diagnosis not present

## 2020-04-27 DIAGNOSIS — M48061 Spinal stenosis, lumbar region without neurogenic claudication: Secondary | ICD-10-CM | POA: Diagnosis not present

## 2020-04-27 DIAGNOSIS — Z4789 Encounter for other orthopedic aftercare: Secondary | ICD-10-CM | POA: Diagnosis not present

## 2020-04-27 DIAGNOSIS — Z9181 History of falling: Secondary | ICD-10-CM | POA: Diagnosis not present

## 2020-04-27 DIAGNOSIS — M5416 Radiculopathy, lumbar region: Secondary | ICD-10-CM | POA: Diagnosis not present

## 2020-04-30 DIAGNOSIS — M5416 Radiculopathy, lumbar region: Secondary | ICD-10-CM | POA: Diagnosis not present

## 2020-04-30 DIAGNOSIS — M48061 Spinal stenosis, lumbar region without neurogenic claudication: Secondary | ICD-10-CM | POA: Diagnosis not present

## 2020-04-30 DIAGNOSIS — Z9181 History of falling: Secondary | ICD-10-CM | POA: Diagnosis not present

## 2020-04-30 DIAGNOSIS — Z4789 Encounter for other orthopedic aftercare: Secondary | ICD-10-CM | POA: Diagnosis not present

## 2020-05-02 DIAGNOSIS — Z4789 Encounter for other orthopedic aftercare: Secondary | ICD-10-CM | POA: Diagnosis not present

## 2020-05-02 DIAGNOSIS — M48061 Spinal stenosis, lumbar region without neurogenic claudication: Secondary | ICD-10-CM | POA: Diagnosis not present

## 2020-05-02 DIAGNOSIS — M5416 Radiculopathy, lumbar region: Secondary | ICD-10-CM | POA: Diagnosis not present

## 2020-05-02 DIAGNOSIS — Z9181 History of falling: Secondary | ICD-10-CM | POA: Diagnosis not present

## 2020-05-07 DIAGNOSIS — Z4789 Encounter for other orthopedic aftercare: Secondary | ICD-10-CM | POA: Diagnosis not present

## 2020-05-07 DIAGNOSIS — M48061 Spinal stenosis, lumbar region without neurogenic claudication: Secondary | ICD-10-CM | POA: Diagnosis not present

## 2020-05-07 DIAGNOSIS — Z9181 History of falling: Secondary | ICD-10-CM | POA: Diagnosis not present

## 2020-05-07 DIAGNOSIS — M5416 Radiculopathy, lumbar region: Secondary | ICD-10-CM | POA: Diagnosis not present

## 2020-05-09 DIAGNOSIS — Z4789 Encounter for other orthopedic aftercare: Secondary | ICD-10-CM | POA: Diagnosis not present

## 2020-05-09 DIAGNOSIS — Z9181 History of falling: Secondary | ICD-10-CM | POA: Diagnosis not present

## 2020-05-09 DIAGNOSIS — M48061 Spinal stenosis, lumbar region without neurogenic claudication: Secondary | ICD-10-CM | POA: Diagnosis not present

## 2020-05-09 DIAGNOSIS — M5416 Radiculopathy, lumbar region: Secondary | ICD-10-CM | POA: Diagnosis not present

## 2020-05-15 DIAGNOSIS — Z4789 Encounter for other orthopedic aftercare: Secondary | ICD-10-CM | POA: Diagnosis not present

## 2020-05-15 DIAGNOSIS — Z9181 History of falling: Secondary | ICD-10-CM | POA: Diagnosis not present

## 2020-05-15 DIAGNOSIS — M48061 Spinal stenosis, lumbar region without neurogenic claudication: Secondary | ICD-10-CM | POA: Diagnosis not present

## 2020-05-15 DIAGNOSIS — M5416 Radiculopathy, lumbar region: Secondary | ICD-10-CM | POA: Diagnosis not present

## 2020-05-22 ENCOUNTER — Other Ambulatory Visit: Payer: Self-pay | Admitting: Neurosurgery

## 2020-05-22 ENCOUNTER — Other Ambulatory Visit (HOSPITAL_COMMUNITY): Payer: Self-pay | Admitting: Neurosurgery

## 2020-05-22 DIAGNOSIS — M5416 Radiculopathy, lumbar region: Secondary | ICD-10-CM

## 2020-05-23 ENCOUNTER — Other Ambulatory Visit: Payer: Self-pay

## 2020-05-23 ENCOUNTER — Ambulatory Visit (HOSPITAL_COMMUNITY)
Admission: RE | Admit: 2020-05-23 | Discharge: 2020-05-23 | Disposition: A | Discharge status: Home / Self Care | Payer: Medicare Other | Source: Ambulatory Visit | Attending: Neurosurgery | Admitting: Neurosurgery

## 2020-05-23 DIAGNOSIS — M5125 Other intervertebral disc displacement, thoracolumbar region: Secondary | ICD-10-CM | POA: Diagnosis not present

## 2020-05-23 DIAGNOSIS — M5416 Radiculopathy, lumbar region: Secondary | ICD-10-CM | POA: Diagnosis present

## 2020-05-23 DIAGNOSIS — M5126 Other intervertebral disc displacement, lumbar region: Secondary | ICD-10-CM | POA: Diagnosis not present

## 2020-05-23 DIAGNOSIS — M48061 Spinal stenosis, lumbar region without neurogenic claudication: Secondary | ICD-10-CM | POA: Diagnosis not present

## 2020-05-23 DIAGNOSIS — M47816 Spondylosis without myelopathy or radiculopathy, lumbar region: Secondary | ICD-10-CM | POA: Diagnosis not present

## 2020-05-23 MED ORDER — GADOBUTROL 1 MMOL/ML IV SOLN
5.0000 mL | Freq: Once | INTRAVENOUS | Status: AC | PRN
  Administered 2020-05-23: 5 mL via INTRAVENOUS

## 2020-05-24 ENCOUNTER — Other Ambulatory Visit: Payer: Self-pay | Admitting: Neurosurgery

## 2020-05-24 DIAGNOSIS — M5416 Radiculopathy, lumbar region: Secondary | ICD-10-CM

## 2020-05-29 ENCOUNTER — Ambulatory Visit
Admission: RE | Admit: 2020-05-29 | Discharge: 2020-05-29 | Disposition: A | Discharge status: Home / Self Care | Payer: Medicare Other | Source: Ambulatory Visit | Attending: Neurosurgery | Admitting: Neurosurgery

## 2020-05-29 ENCOUNTER — Other Ambulatory Visit: Payer: Self-pay

## 2020-05-29 DIAGNOSIS — M5117 Intervertebral disc disorders with radiculopathy, lumbosacral region: Secondary | ICD-10-CM | POA: Diagnosis not present

## 2020-05-29 DIAGNOSIS — M5416 Radiculopathy, lumbar region: Secondary | ICD-10-CM

## 2020-05-29 MED ORDER — METHYLPREDNISOLONE ACETATE 40 MG/ML INJ SUSP (RADIOLOG
120.0000 mg | Freq: Once | INTRAMUSCULAR | Status: AC
  Administered 2020-05-29: 120 mg via EPIDURAL

## 2020-05-29 MED ORDER — IOPAMIDOL (ISOVUE-M 200) INJECTION 41%
1.0000 mL | Freq: Once | INTRAMUSCULAR | Status: AC
  Administered 2020-05-29: 1 mL via EPIDURAL

## 2020-05-29 NOTE — Discharge Instructions (Signed)

## 2020-06-06 ENCOUNTER — Other Ambulatory Visit: Payer: Self-pay | Admitting: Neurosurgery

## 2020-06-07 ENCOUNTER — Other Ambulatory Visit: Payer: Self-pay

## 2020-06-07 ENCOUNTER — Other Ambulatory Visit (HOSPITAL_COMMUNITY)
Admission: RE | Admit: 2020-06-07 | Discharge: 2020-06-07 | Disposition: A | Discharge status: Home / Self Care | Payer: Medicare Other | Source: Ambulatory Visit | Attending: Neurosurgery | Admitting: Neurosurgery

## 2020-06-07 ENCOUNTER — Encounter (HOSPITAL_COMMUNITY): Payer: Self-pay | Admitting: Neurosurgery

## 2020-06-07 DIAGNOSIS — Z01812 Encounter for preprocedural laboratory examination: Secondary | ICD-10-CM | POA: Diagnosis not present

## 2020-06-07 DIAGNOSIS — Z20822 Contact with and (suspected) exposure to covid-19: Secondary | ICD-10-CM | POA: Insufficient documentation

## 2020-06-07 LAB — SARS CORONAVIRUS 2 (TAT 6-24 HRS): SARS Coronavirus 2: NEGATIVE

## 2020-06-07 NOTE — Progress Notes (Signed)
Spoke with pt for pre-op call. Pt denies HTN, but has been told per cardiac scoring that he has CAD, but only treatment has been Crestor. Pt states he is not diabetic.  Covid test done today, pt states he's in quarantine and understands that he stays in quarantine until he comes to the hospital tomorrow.

## 2020-06-07 NOTE — Anesthesia Preprocedure Evaluation (Addendum)
Anesthesia Evaluation  Patient identified by MRN, date of birth, ID band Patient awake    Reviewed: Allergy & Precautions, NPO status , Patient's Chart, lab work & pertinent test results  Airway Mallampati: I  TM Distance: >3 FB Neck ROM: Full    Dental no notable dental hx. (+) Teeth Intact   Pulmonary neg pulmonary ROS,    Pulmonary exam normal breath sounds clear to auscultation       Cardiovascular Exercise Tolerance: Good hypertension, + CAD  Normal cardiovascular exam Rhythm:Regular Rate:Normal  CAD by coronary calcium score  Pt. Denies HTN  EKG 04/19/20 NSR, normal EKG   Neuro/Psych  Neuromuscular disease negative psych ROS   GI/Hepatic negative GI ROS,   Endo/Other  Hyperlipidemia  Renal/GU Renal InsufficiencyRenal disease   Elevated PSA    Musculoskeletal  (+) Arthritis , Osteoarthritis,  Lumbar spinal and foraminal stenosis Occipito-cervical neuralgia Cervical stenosis  Lumbar radiculopathy   Abdominal   Peds  Hematology negative hematology ROS (+)   Anesthesia Other Findings   Reproductive/Obstetrics                           Anesthesia Physical Anesthesia Plan  ASA: II  Anesthesia Plan: General   Post-op Pain Management:    Induction: Intravenous  PONV Risk Score and Plan: 3 and Ondansetron and Treatment may vary due to age or medical condition  Airway Management Planned: Oral ETT  Additional Equipment:   Intra-op Plan:   Post-operative Plan: Extubation in OR  Informed Consent: I have reviewed the patients History and Physical, chart, labs and discussed the procedure including the risks, benefits and alternatives for the proposed anesthesia with the patient or authorized representative who has indicated his/her understanding and acceptance.     Dental advisory given  Plan Discussed with: CRNA, Anesthesiologist and Surgeon  Anesthesia Plan Comments:         Anesthesia Quick Evaluation

## 2020-06-08 ENCOUNTER — Encounter (HOSPITAL_COMMUNITY): Payer: Self-pay | Admitting: Neurosurgery

## 2020-06-08 ENCOUNTER — Other Ambulatory Visit: Payer: Self-pay

## 2020-06-08 ENCOUNTER — Ambulatory Visit (HOSPITAL_COMMUNITY): Payer: Medicare Other

## 2020-06-08 ENCOUNTER — Ambulatory Visit (HOSPITAL_COMMUNITY): Payer: Medicare Other | Admitting: Certified Registered Nurse Anesthetist

## 2020-06-08 ENCOUNTER — Encounter (HOSPITAL_COMMUNITY)
Admission: RE | Disposition: A | Discharge status: Home / Self Care | Payer: Self-pay | Source: Home / Self Care | Attending: Neurosurgery

## 2020-06-08 ENCOUNTER — Observation Stay (HOSPITAL_COMMUNITY)
Admission: RE | Admit: 2020-06-08 | Discharge: 2020-06-09 | Disposition: A | Discharge status: Home / Self Care | Payer: Medicare Other | Attending: Neurosurgery | Admitting: Neurosurgery

## 2020-06-08 DIAGNOSIS — M5416 Radiculopathy, lumbar region: Secondary | ICD-10-CM | POA: Diagnosis not present

## 2020-06-08 DIAGNOSIS — I129 Hypertensive chronic kidney disease with stage 1 through stage 4 chronic kidney disease, or unspecified chronic kidney disease: Secondary | ICD-10-CM | POA: Insufficient documentation

## 2020-06-08 DIAGNOSIS — Z79899 Other long term (current) drug therapy: Secondary | ICD-10-CM | POA: Insufficient documentation

## 2020-06-08 DIAGNOSIS — I2510 Atherosclerotic heart disease of native coronary artery without angina pectoris: Secondary | ICD-10-CM | POA: Diagnosis not present

## 2020-06-08 DIAGNOSIS — Z7982 Long term (current) use of aspirin: Secondary | ICD-10-CM | POA: Insufficient documentation

## 2020-06-08 DIAGNOSIS — N183 Chronic kidney disease, stage 3 unspecified: Secondary | ICD-10-CM | POA: Insufficient documentation

## 2020-06-08 DIAGNOSIS — M48061 Spinal stenosis, lumbar region without neurogenic claudication: Secondary | ICD-10-CM | POA: Diagnosis not present

## 2020-06-08 DIAGNOSIS — Z419 Encounter for procedure for purposes other than remedying health state, unspecified: Secondary | ICD-10-CM

## 2020-06-08 DIAGNOSIS — M47816 Spondylosis without myelopathy or radiculopathy, lumbar region: Secondary | ICD-10-CM | POA: Diagnosis not present

## 2020-06-08 DIAGNOSIS — Z981 Arthrodesis status: Secondary | ICD-10-CM | POA: Diagnosis not present

## 2020-06-08 DIAGNOSIS — M79604 Pain in right leg: Secondary | ICD-10-CM | POA: Diagnosis present

## 2020-06-08 DIAGNOSIS — M4726 Other spondylosis with radiculopathy, lumbar region: Secondary | ICD-10-CM | POA: Diagnosis not present

## 2020-06-08 HISTORY — PX: LUMBAR LAMINECTOMY/DECOMPRESSION MICRODISCECTOMY: SHX5026

## 2020-06-08 LAB — CBC WITH DIFFERENTIAL/PLATELET
Abs Immature Granulocytes: 0.05 10*3/uL (ref 0.00–0.07)
Basophils Absolute: 0.1 10*3/uL (ref 0.0–0.1)
Basophils Relative: 1 %
Eosinophils Absolute: 0.2 10*3/uL (ref 0.0–0.5)
Eosinophils Relative: 2 %
HCT: 41.8 % (ref 39.0–52.0)
Hemoglobin: 13.5 g/dL (ref 13.0–17.0)
Immature Granulocytes: 1 %
Lymphocytes Relative: 37 %
Lymphs Abs: 2.6 10*3/uL (ref 0.7–4.0)
MCH: 30.5 pg (ref 26.0–34.0)
MCHC: 32.3 g/dL (ref 30.0–36.0)
MCV: 94.4 fL (ref 80.0–100.0)
Monocytes Absolute: 0.9 10*3/uL (ref 0.1–1.0)
Monocytes Relative: 12 %
Neutro Abs: 3.3 10*3/uL (ref 1.7–7.7)
Neutrophils Relative %: 47 %
Platelets: 198 10*3/uL (ref 150–400)
RBC: 4.43 MIL/uL (ref 4.22–5.81)
RDW: 12.8 % (ref 11.5–15.5)
WBC: 7.1 10*3/uL (ref 4.0–10.5)
nRBC: 0 % (ref 0.0–0.2)

## 2020-06-08 LAB — BASIC METABOLIC PANEL
Anion gap: 9 (ref 5–15)
BUN: 25 mg/dL — ABNORMAL HIGH (ref 8–23)
CO2: 26 mmol/L (ref 22–32)
Calcium: 8.9 mg/dL (ref 8.9–10.3)
Chloride: 101 mmol/L (ref 98–111)
Creatinine, Ser: 1.31 mg/dL — ABNORMAL HIGH (ref 0.61–1.24)
GFR calc Af Amer: 58 mL/min — ABNORMAL LOW (ref >60)
GFR calc non Af Amer: 50 mL/min — ABNORMAL LOW (ref >60)
Glucose, Bld: 201 mg/dL — ABNORMAL HIGH (ref 70–99)
Potassium: 4.3 mmol/L (ref 3.5–5.1)
Sodium: 136 mmol/L (ref 135–145)

## 2020-06-08 SURGERY — LUMBAR LAMINECTOMY/DECOMPRESSION MICRODISCECTOMY 1 LEVEL
Anesthesia: General | Site: Spine Lumbar | Laterality: Right

## 2020-06-08 MED ORDER — KETOROLAC TROMETHAMINE 15 MG/ML IJ SOLN
INTRAMUSCULAR | Status: DC | PRN
  Administered 2020-06-08: 15 mg via INTRAVENOUS

## 2020-06-08 MED ORDER — SODIUM CHLORIDE 0.9 % IV SOLN: INTRAVENOUS | Status: DC | PRN

## 2020-06-08 MED ORDER — LACTATED RINGERS IV SOLN: INTRAVENOUS | Status: DC

## 2020-06-08 MED ORDER — PHENYLEPHRINE HCL-NACL 10-0.9 MG/250ML-% IV SOLN
INTRAVENOUS | Status: DC | PRN
Start: 2020-06-08 — End: 2020-06-08
  Administered 2020-06-08: 25 ug/min via INTRAVENOUS

## 2020-06-08 MED ORDER — OXYCODONE HCL 5 MG PO TABS: 5.0000 mg | ORAL_TABLET | Freq: Once | ORAL | Status: DC | PRN

## 2020-06-08 MED ORDER — SODIUM CHLORIDE 0.9% FLUSH: 3.0000 mL | INTRAVENOUS | Status: DC | PRN

## 2020-06-08 MED ORDER — MENTHOL 3 MG MT LOZG: 1.0000 | LOZENGE | OROMUCOSAL | Status: DC | PRN

## 2020-06-08 MED ORDER — HYDROCODONE-ACETAMINOPHEN 10-325 MG PO TABS
2.0000 | ORAL_TABLET | ORAL | Status: DC | PRN
  Administered 2020-06-08: 2 via ORAL
  Filled 2020-06-08: qty 2

## 2020-06-08 MED ORDER — EZETIMIBE 10 MG PO TABS
10.0000 mg | ORAL_TABLET | Freq: Every day | ORAL | Status: DC
  Administered 2020-06-08: 10 mg via ORAL
  Filled 2020-06-08: qty 1

## 2020-06-08 MED ORDER — HYDROCODONE-ACETAMINOPHEN 5-325 MG PO TABS
1.0000 | ORAL_TABLET | ORAL | Status: DC | PRN
  Administered 2020-06-08 – 2020-06-09 (×2): 1 via ORAL
  Filled 2020-06-08 (×2): qty 1

## 2020-06-08 MED ORDER — PROPOFOL 10 MG/ML IV BOLUS
INTRAVENOUS | Status: AC
  Filled 2020-06-08: qty 20

## 2020-06-08 MED ORDER — CEFAZOLIN SODIUM-DEXTROSE 2-4 GM/100ML-% IV SOLN
2.0000 g | INTRAVENOUS | Status: AC
  Administered 2020-06-08: 2 g via INTRAVENOUS
  Filled 2020-06-08: qty 100

## 2020-06-08 MED ORDER — OXYCODONE HCL 5 MG/5ML PO SOLN: 5.0000 mg | Freq: Once | ORAL | Status: DC | PRN

## 2020-06-08 MED ORDER — CYCLOBENZAPRINE HCL 10 MG PO TABS
10.0000 mg | ORAL_TABLET | Freq: Three times a day (TID) | ORAL | Status: DC | PRN
  Administered 2020-06-08 – 2020-06-09 (×2): 10 mg via ORAL
  Filled 2020-06-08 (×2): qty 1

## 2020-06-08 MED ORDER — HEMOSTATIC AGENTS (NO CHARGE) OPTIME
TOPICAL | Status: DC | PRN
  Administered 2020-06-08 (×2): 1 via TOPICAL

## 2020-06-08 MED ORDER — THROMBIN 5000 UNITS EX SOLR
CUTANEOUS | Status: AC
  Filled 2020-06-08: qty 5000

## 2020-06-08 MED ORDER — CHLORHEXIDINE GLUCONATE CLOTH 2 % EX PADS: 6.0000 | MEDICATED_PAD | Freq: Once | CUTANEOUS | Status: DC

## 2020-06-08 MED ORDER — CHLORHEXIDINE GLUCONATE 0.12 % MT SOLN
15.0000 mL | Freq: Once | OROMUCOSAL | Status: AC
  Administered 2020-06-08: 15 mL via OROMUCOSAL
  Filled 2020-06-08: qty 15

## 2020-06-08 MED ORDER — FENTANYL CITRATE (PF) 100 MCG/2ML IJ SOLN: 25.0000 ug | INTRAMUSCULAR | Status: DC | PRN

## 2020-06-08 MED ORDER — PHENOL 1.4 % MT LIQD: 1.0000 | OROMUCOSAL | Status: DC | PRN

## 2020-06-08 MED ORDER — KETOROLAC TROMETHAMINE 15 MG/ML IJ SOLN
15.0000 mg | Freq: Four times a day (QID) | INTRAMUSCULAR | Status: AC
  Administered 2020-06-08 – 2020-06-09 (×4): 15 mg via INTRAVENOUS
  Filled 2020-06-08 (×4): qty 1

## 2020-06-08 MED ORDER — ONDANSETRON HCL 4 MG/2ML IJ SOLN
INTRAMUSCULAR | Status: AC
  Filled 2020-06-08: qty 2

## 2020-06-08 MED ORDER — LIDOCAINE 2% (20 MG/ML) 5 ML SYRINGE
INTRAMUSCULAR | Status: AC
  Filled 2020-06-08: qty 5

## 2020-06-08 MED ORDER — ONDANSETRON HCL 4 MG/2ML IJ SOLN: 4.0000 mg | Freq: Once | INTRAMUSCULAR | Status: DC | PRN

## 2020-06-08 MED ORDER — THROMBIN 5000 UNITS EX SOLR
CUTANEOUS | Status: DC | PRN
  Administered 2020-06-08 (×2): 5000 [IU] via TOPICAL

## 2020-06-08 MED ORDER — PHENYLEPHRINE 40 MCG/ML (10ML) SYRINGE FOR IV PUSH (FOR BLOOD PRESSURE SUPPORT)
PREFILLED_SYRINGE | INTRAVENOUS | Status: AC
  Filled 2020-06-08: qty 10

## 2020-06-08 MED ORDER — EPHEDRINE SULFATE-NACL 50-0.9 MG/10ML-% IV SOSY
PREFILLED_SYRINGE | INTRAVENOUS | Status: DC | PRN
  Administered 2020-06-08: 10 mg via INTRAVENOUS

## 2020-06-08 MED ORDER — DEXAMETHASONE SODIUM PHOSPHATE 10 MG/ML IJ SOLN
INTRAMUSCULAR | Status: AC
  Filled 2020-06-08: qty 1

## 2020-06-08 MED ORDER — PHENYLEPHRINE 40 MCG/ML (10ML) SYRINGE FOR IV PUSH (FOR BLOOD PRESSURE SUPPORT)
PREFILLED_SYRINGE | INTRAVENOUS | Status: DC | PRN
  Administered 2020-06-08: 120 ug via INTRAVENOUS
  Administered 2020-06-08: 40 ug via INTRAVENOUS
  Administered 2020-06-08: 80 ug via INTRAVENOUS

## 2020-06-08 MED ORDER — DEXAMETHASONE SODIUM PHOSPHATE 10 MG/ML IJ SOLN
10.0000 mg | Freq: Once | INTRAMUSCULAR | Status: AC
  Administered 2020-06-08: 10 mg via INTRAVENOUS
  Filled 2020-06-08: qty 1

## 2020-06-08 MED ORDER — FENTANYL CITRATE (PF) 250 MCG/5ML IJ SOLN
INTRAMUSCULAR | Status: AC
  Filled 2020-06-08: qty 5

## 2020-06-08 MED ORDER — LIDOCAINE HCL (CARDIAC) PF 100 MG/5ML IV SOSY
PREFILLED_SYRINGE | INTRAVENOUS | Status: DC | PRN
  Administered 2020-06-08: 60 mg via INTRAVENOUS

## 2020-06-08 MED ORDER — ONDANSETRON HCL 4 MG/2ML IJ SOLN
INTRAMUSCULAR | Status: DC | PRN
  Administered 2020-06-08: 4 mg via INTRAVENOUS

## 2020-06-08 MED ORDER — HYDROMORPHONE HCL 1 MG/ML IJ SOLN: 1.0000 mg | INTRAMUSCULAR | Status: DC | PRN

## 2020-06-08 MED ORDER — ONDANSETRON HCL 4 MG PO TABS: 4.0000 mg | ORAL_TABLET | Freq: Four times a day (QID) | ORAL | Status: DC | PRN

## 2020-06-08 MED ORDER — BUPIVACAINE HCL (PF) 0.25 % IJ SOLN
INTRAMUSCULAR | Status: AC
  Filled 2020-06-08: qty 30

## 2020-06-08 MED ORDER — ROCURONIUM BROMIDE 10 MG/ML (PF) SYRINGE
PREFILLED_SYRINGE | INTRAVENOUS | Status: AC
  Filled 2020-06-08: qty 10

## 2020-06-08 MED ORDER — SUGAMMADEX SODIUM 200 MG/2ML IV SOLN
INTRAVENOUS | Status: DC | PRN
  Administered 2020-06-08: 200 mg via INTRAVENOUS

## 2020-06-08 MED ORDER — PROPOFOL 10 MG/ML IV BOLUS
INTRAVENOUS | Status: DC | PRN
  Administered 2020-06-08: 100 mg via INTRAVENOUS

## 2020-06-08 MED ORDER — CEFAZOLIN SODIUM-DEXTROSE 1-4 GM/50ML-% IV SOLN
1.0000 g | Freq: Three times a day (TID) | INTRAVENOUS | Status: AC
  Administered 2020-06-08 (×2): 1 g via INTRAVENOUS
  Filled 2020-06-08 (×2): qty 50

## 2020-06-08 MED ORDER — ONDANSETRON HCL 4 MG/2ML IJ SOLN: 4.0000 mg | Freq: Four times a day (QID) | INTRAMUSCULAR | Status: DC | PRN

## 2020-06-08 MED ORDER — ORAL CARE MOUTH RINSE: 15.0000 mL | Freq: Once | OROMUCOSAL | Status: AC

## 2020-06-08 MED ORDER — ACETAMINOPHEN 650 MG RE SUPP: 650.0000 mg | RECTAL | Status: DC | PRN

## 2020-06-08 MED ORDER — ACETAMINOPHEN 325 MG PO TABS: 650.0000 mg | ORAL_TABLET | ORAL | Status: DC | PRN

## 2020-06-08 MED ORDER — 0.9 % SODIUM CHLORIDE (POUR BTL) OPTIME
TOPICAL | Status: DC | PRN
  Administered 2020-06-08: 1000 mL

## 2020-06-08 MED ORDER — FENTANYL CITRATE (PF) 100 MCG/2ML IJ SOLN
INTRAMUSCULAR | Status: DC | PRN
  Administered 2020-06-08: 100 ug via INTRAVENOUS
  Administered 2020-06-08: 50 ug via INTRAVENOUS

## 2020-06-08 MED ORDER — LACTATED RINGERS IV SOLN: INTRAVENOUS | Status: DC | PRN

## 2020-06-08 MED ORDER — SODIUM CHLORIDE 0.9 % IV SOLN: 250.0000 mL | INTRAVENOUS | Status: DC

## 2020-06-08 MED ORDER — CLONAZEPAM 0.5 MG PO TABS
0.5000 mg | ORAL_TABLET | Freq: Two times a day (BID) | ORAL | Status: DC
  Administered 2020-06-08 – 2020-06-09 (×2): 0.5 mg via ORAL
  Filled 2020-06-08 (×2): qty 1

## 2020-06-08 MED ORDER — SODIUM CHLORIDE 0.9% FLUSH
3.0000 mL | Freq: Two times a day (BID) | INTRAVENOUS | Status: DC
  Administered 2020-06-08 – 2020-06-09 (×2): 3 mL via INTRAVENOUS

## 2020-06-08 MED ORDER — THROMBIN 5000 UNITS EX SOLR
CUTANEOUS | Status: AC
  Filled 2020-06-08: qty 10000

## 2020-06-08 MED ORDER — PREGABALIN 75 MG PO CAPS
75.0000 mg | ORAL_CAPSULE | Freq: Two times a day (BID) | ORAL | Status: DC
  Administered 2020-06-08 – 2020-06-09 (×2): 75 mg via ORAL
  Filled 2020-06-08 (×2): qty 1

## 2020-06-08 MED ORDER — ROSUVASTATIN CALCIUM 20 MG PO TABS
20.0000 mg | ORAL_TABLET | Freq: Every day | ORAL | Status: DC
  Administered 2020-06-08: 20 mg via ORAL
  Filled 2020-06-08: qty 1

## 2020-06-08 MED ORDER — BUPIVACAINE HCL (PF) 0.25 % IJ SOLN
INTRAMUSCULAR | Status: DC | PRN
  Administered 2020-06-08: 19 mL

## 2020-06-08 MED ORDER — ROCURONIUM BROMIDE 100 MG/10ML IV SOLN
INTRAVENOUS | Status: DC | PRN
  Administered 2020-06-08: 50 mg via INTRAVENOUS

## 2020-06-08 SURGICAL SUPPLY — 48 items
BAG DECANTER FOR FLEXI CONT (MISCELLANEOUS) ×3 IMPLANT
BAND RUBBER #18 3X1/16 STRL (MISCELLANEOUS) ×6 IMPLANT
BENZOIN TINCTURE PRP APPL 2/3 (GAUZE/BANDAGES/DRESSINGS) ×3 IMPLANT
BUR CUTTER 7.0 ROUND (BURR) ×3 IMPLANT
CANISTER SUCT 3000ML PPV (MISCELLANEOUS) ×3 IMPLANT
CARTRIDGE OIL MAESTRO DRILL (MISCELLANEOUS) ×1 IMPLANT
CLOSURE WOUND 1/2 X4 (GAUZE/BANDAGES/DRESSINGS) ×1
DERMABOND ADVANCED (GAUZE/BANDAGES/DRESSINGS) ×2
DERMABOND ADVANCED .7 DNX12 (GAUZE/BANDAGES/DRESSINGS) ×1 IMPLANT
DIFFUSER DRILL AIR PNEUMATIC (MISCELLANEOUS) ×3 IMPLANT
DRAPE HALF SHEET 40X57 (DRAPES) ×3 IMPLANT
DRAPE LAPAROTOMY 100X72X124 (DRAPES) ×3 IMPLANT
DRAPE MICROSCOPE LEICA (MISCELLANEOUS) ×3 IMPLANT
DRAPE SURG 17X23 STRL (DRAPES) ×6 IMPLANT
DRSG OPSITE POSTOP 3X4 (GAUZE/BANDAGES/DRESSINGS) ×3 IMPLANT
DURAPREP 26ML APPLICATOR (WOUND CARE) ×3 IMPLANT
ELECT REM PT RETURN 9FT ADLT (ELECTROSURGICAL) ×3
ELECTRODE REM PT RTRN 9FT ADLT (ELECTROSURGICAL) ×1 IMPLANT
GAUZE SPONGE 4X4 12PLY STRL (GAUZE/BANDAGES/DRESSINGS) ×3 IMPLANT
GLOVE BIO SURGEON STRL SZ 6.5 (GLOVE) ×2 IMPLANT
GLOVE BIO SURGEONS STRL SZ 6.5 (GLOVE) ×1
GLOVE BIOGEL PI IND STRL 6.5 (GLOVE) ×3 IMPLANT
GLOVE BIOGEL PI IND STRL 7.0 (GLOVE) ×2 IMPLANT
GLOVE BIOGEL PI IND STRL 7.5 (GLOVE) ×2 IMPLANT
GLOVE BIOGEL PI INDICATOR 6.5 (GLOVE) ×6
GLOVE BIOGEL PI INDICATOR 7.0 (GLOVE) ×4
GLOVE BIOGEL PI INDICATOR 7.5 (GLOVE) ×4
GLOVE ECLIPSE 9.0 STRL (GLOVE) ×3 IMPLANT
GLOVE SS N UNI LF 7.0 STRL (GLOVE) ×9 IMPLANT
GOWN STRL REUS W/ TWL LRG LVL3 (GOWN DISPOSABLE) ×2 IMPLANT
GOWN STRL REUS W/ TWL XL LVL3 (GOWN DISPOSABLE) ×2 IMPLANT
GOWN STRL REUS W/TWL LRG LVL3 (GOWN DISPOSABLE) ×6
GOWN STRL REUS W/TWL XL LVL3 (GOWN DISPOSABLE) ×6
KIT BASIN OR (CUSTOM PROCEDURE TRAY) ×3 IMPLANT
KIT TURNOVER KIT B (KITS) ×3 IMPLANT
NEEDLE HYPO 22GX1.5 SAFETY (NEEDLE) ×3 IMPLANT
NEEDLE SPNL 22GX3.5 QUINCKE BK (NEEDLE) ×3 IMPLANT
NS IRRIG 1000ML POUR BTL (IV SOLUTION) ×3 IMPLANT
OIL CARTRIDGE MAESTRO DRILL (MISCELLANEOUS) ×3
PACK LAMINECTOMY NEURO (CUSTOM PROCEDURE TRAY) ×3 IMPLANT
SEALANT ADHERUS EXTEND TIP (MISCELLANEOUS) ×3 IMPLANT
SPONGE SURGIFOAM ABS GEL SZ50 (HEMOSTASIS) ×3 IMPLANT
STRIP CLOSURE SKIN 1/2X4 (GAUZE/BANDAGES/DRESSINGS) ×2 IMPLANT
SUT VIC AB 2-0 CT1 18 (SUTURE) ×3 IMPLANT
SUT VIC AB 3-0 SH 8-18 (SUTURE) ×3 IMPLANT
TOWEL GREEN STERILE (TOWEL DISPOSABLE) ×3 IMPLANT
TOWEL GREEN STERILE FF (TOWEL DISPOSABLE) ×3 IMPLANT
WATER STERILE IRR 1000ML POUR (IV SOLUTION) ×3 IMPLANT

## 2020-06-08 NOTE — Anesthesia Postprocedure Evaluation (Addendum)
Anesthesia Post Note  Patient: Dennis Carey  Procedure(s) Performed: Right Lumbar Two-Three Laminectomy and Foraminotomy (Right Spine Lumbar)     Patient location during evaluation: PACU Anesthesia Type: General Level of consciousness: awake and alert and oriented Pain management: pain level controlled Vital Signs Assessment: post-procedure vital signs reviewed and stable Respiratory status: nonlabored ventilation, respiratory function stable, patient connected to nasal cannula oxygen and spontaneous breathing Cardiovascular status: blood pressure returned to baseline and stable Postop Assessment: no apparent nausea or vomiting Anesthetic complications: no   No complications documented.  Last Vitals:  Vitals:   06/08/20 1100 06/08/20 1135  BP:  (!) 157/91  Pulse: 62 79  Resp: 14 16  Temp:  36.4 C  SpO2: 96% 100%    Last Pain:  Vitals:   06/08/20 1135  TempSrc: Oral  PainSc:                  Markie Heffernan A.

## 2020-06-08 NOTE — Op Note (Signed)
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  Asst. Surgeon: Reinaldo Meeker, NP  Anesthesia: General  Indication: 85 year old male with severe right lower extremity pain failed conservative management work-up demonstrates evidence of marked right-sided L5-3 stenosis with severe thecal sac and L3 nerve root compression.  Patient has failed conservative management and presents now for decompressive laminotomy and foraminotomies in hopes of improving his symptoms.  Operative note: After induction of anesthesia, patient position prone on the Wilson frame and properly padded.  Lumbar region prepped and draped sterilely.  Incision made overlying L2-3.  Dissection performed on the right.  Retractor placed.  X-ray taken.  Level confirmed.  Laminotomy performed using high-speed drill and Kerrison Rogers.  Ligament flavum elevated and resected.  Underlying thecal sac and right L3 nerve root identified.  Microscope brought in field used for microdissection of the spinal canal.  Thecal sac and L3 nerve root gently mobilized retract towards midline.  There was a large dorsally projecting osteophytic protrusion coming from the inferior aspect of the vertebral body of L2 and overhanging the disc space causing marked compression of the thecal sac and L3 nerve root.  The disc space and osteophyte were resected using Kerrison rongeurs and micropituitary's.  In the process of removing medial spur there was one-point where the osteophyte was adherent to the thecal sac and a small dural laceration was made.  This was within the ventral dura and not something that could be repaired primarily with suture.  This area was packed off and the discectomy/osteophytectomy was completed.  There was no evidence of  injury to any nerve roots themselves or any herniation of any nerve roots.  Gelfoam was placed deep along the ventral aspect of thecal sac with reasonably good control the CSF leak.  Adherence fibrin sealant was then sprayed over the laminotomy defect.  This achieved what appeared to be watertight closure.  Wound is then closed tightly in layers with a running nylon on the skin.  There were no apparent complications.  Patient tolerated procedure well and he returns to recovery room postop.

## 2020-06-08 NOTE — Anesthesia Procedure Notes (Signed)
Procedure Name: Intubation Date/Time: 06/08/2020 8:02 AM Performed by: Alain Marion, CRNA Pre-anesthesia Checklist: Patient identified, Emergency Drugs available, Suction available and Patient being monitored Patient Re-evaluated:Patient Re-evaluated prior to induction Oxygen Delivery Method: Circle System Utilized Preoxygenation: Pre-oxygenation with 100% oxygen Induction Type: IV induction Ventilation: Mask ventilation without difficulty and Oral airway inserted - appropriate to patient size Laryngoscope Size: Mac and 3 Grade View: Grade I Tube type: Oral Tube size: 7.5 mm Number of attempts: 1 Airway Equipment and Method: Stylet and Oral airway Placement Confirmation: ETT inserted through vocal cords under direct vision,  positive ETCO2 and breath sounds checked- equal and bilateral Secured at: 22 cm Tube secured with: Tape Dental Injury: Teeth and Oropharynx as per pre-operative assessment  Comments: Intubated by lauren ferm,srna under supervision of md and crna

## 2020-06-08 NOTE — H&P (Signed)
Dennis Carey is an 85 y.o. male.   Chief Complaint: Right leg pain HPI: 85 year old male status post right-sided L4-5 and L5-S1 decompressive surgery.  The patient had initial good relief of his right distal lower extremity radicular symptoms but over his postoperative period has begun having worsening pain into his right anterior thigh consistent with a L3 and L4 radicular pattern.  Work-up has demonstrated significant stenosis on the right at L2-3.  Patient had some transient improvement with epidural steroid injections but no lasting benefit.  He is miserable with this pain.  Patient presents now for right-sided L2-3 decompressive surgery.  Past Medical History:  Diagnosis Date  . CAD (coronary artery disease)    significant CAD by cardiac calcium scoring test  . CKD (chronic kidney disease), stage III    by serum creatinine is stable  . HTN (hypertension)    pt denies   . Hyperlipidemia   . Microhematuria   . PSA elevation 2014   4.75 February 2017, 6.9 in 2/18 and declines urologist eval or trial of antibiotics    Past Surgical History:  Procedure Laterality Date  . dental implants     3 of them 2008  . LUMBAR LAMINECTOMY/DECOMPRESSION MICRODISCECTOMY Right 04/20/2020   Procedure: Laminectomy and Foraminotomy - right - Lumbar five-Sacral one;  Surgeon: [name], MD;  Location: [facility];  Service: Neurosurgery;  Laterality: Right;  . submucous resection nose     1956    Family History  Problem Relation Age of Onset  . Colon cancer Neg Hx   . Migraines Neg Hx    Social History:  reports that he has never smoked. He has never used smokeless tobacco. He reports current alcohol use. He reports that he does not use drugs.  Allergies:  Allergies  Allergen Reactions  . Sulfa Antibiotics Hives    Medications Prior to Admission  Medication Sig Dispense Refill  . aspirin 81 MG tablet Take 81 mg by mouth daily.    . clonazePAM (KLONOPIN) 1 MG tablet Take 0.5 mg by mouth every  12 (twelve) hours.    Marland Kitchen ezetimibe (ZETIA) 10 MG tablet Take 10 mg by mouth at bedtime.     Marland Kitchen HYDROcodone-acetaminophen (NORCO/VICODIN) 5-325 MG tablet Take 1 tablet by mouth every 4 (four) hours as needed for pain.    . pregabalin (LYRICA) 75 MG capsule Take 75 mg by mouth 2 (two) times daily.    . rosuvastatin (CRESTOR) 20 MG tablet Take 20 mg by mouth at bedtime.       Results for orders placed or performed during the hospital encounter of 06/08/20 (from the past 48 hour(s))  CBC WITH DIFFERENTIAL     Status: None   Collection Time: 06/08/20  6:47 AM  Result Value Ref Range   WBC 7.1 4.0 - 10.5 K/uL   RBC 4.43 4.22 - 5.81 MIL/uL   Hemoglobin 13.5 13.0 - 17.0 g/dL   HCT 41.8 39 - 52 %   MCV 94.4 80.0 - 100.0 fL   MCH 30.5 26.0 - 34.0 pg   MCHC 32.3 30.0 - 36.0 g/dL   RDW 12.8 11.5 - 15.5 %   Platelets 198 150 - 400 K/uL   nRBC 0.0 0.0 - 0.2 %   Neutrophils Relative % 47 %   Neutro Abs 3.3 1.7 - 7.7 K/uL   Lymphocytes Relative 37 %   Lymphs Abs 2.6 0.7 - 4.0 K/uL   Monocytes Relative 12 %   Monocytes Absolute 0.9 0 -  1 K/uL   Eosinophils Relative 2 %   Eosinophils Absolute 0.2 0 - 0 K/uL   Basophils Relative 1 %   Basophils Absolute 0.1 0 - 0 K/uL   Immature Granulocytes 1 %   Abs Immature Granulocytes 0.05 0.00 - 0.07 K/uL    Comment: Performed at [facility] [address], [place], [place] [address]   No results found.  Pertinent items noted in HPI and remainder of comprehensive ROS otherwise negative.  Blood pressure (!) 132/73, pulse 68, temperature 97.9 F (36.6 C), temperature source Oral, resp. rate 17, height 5' 6.5" (1.689 m), weight 52 kg, SpO2 99 %.  Patient is awake and alert.  He is oriented and appropriate.  Speech is fluent.  Judgment insight are intact.  Cranial nerve function normal bilateral.  Motor examination extremities reveals intact motor strength bilateral.  Sensory examination some decrease sensation pinprick light touch in his right L3  dermatome and right L5 dermatome.  Reflexes are hypoactive but symmetric.  No evidence of long track signs.  Gait is antalgic.  Posture is reasonably normal peer examination head ears eyes nose throat is unremarked.  Chest and abdomen benign.  Extremities are free from injury deformity. Assessment/Plan Right L2-3 stenosis with radiculopathy.  Plan right L2-3 decompressive laminotomy and foraminotomies.  Risks and benefits been explained.  Patient wishes to proceed.  Cooper Render Boris Engelmann 06/08/2020, 7:41 AM

## 2020-06-08 NOTE — Evaluation (Signed)
Occupational Therapy Evaluation Patient Details Name: Dennis Carey MRN: [id number] DOB: 1934/12/05 Today's Date: 06/08/2020    History of Present Illness 85 y.o male s/p right L 2-3 lami and foraminotomy. PMH includes recent lumbar sx 04/20/20, CAD, CKD, HTN, and cervico-occipital neuralgia   Clinical Impression   PTA pt living with wife and functioning at independent level. He did have a recent lumbar sx in 06/21, and had still been recovering and not as active as original baseline. At time of eval, pt presents with ability to complete bed mobility and sit <> stands at min guard assist for safety. Back handout provided and reviewed adls in detail. Pt educated on: set an alarm at night for medication, avoid sitting for long periods of time, correct bed positioning for sleeping, using figure 4 position for LB dressing, appropriate use of shower chair to maintain safety, correct sequence for bed mobility, use of reacher as needed, how to safely complete posterior peri care, avoiding lifting more than 5 pounds and never wash directly over incision. All education is complete and patient indicates understanding. Wife and daughter present for education as well. All OT needs have been met at this time, OT Will sign off. Thank you for this consult.    Follow Up Recommendations  No OT follow up;Supervision - Intermittent    Equipment Recommendations  Tub/shower seat    Recommendations for Other Services       Precautions / Restrictions Precautions Precautions: Fall;Back Precaution Booklet Issued: Yes (comment) Precaution Comments: reviewed throughout session with pt, dtr, and wife Restrictions Weight Bearing Restrictions: No Other Position/Activity Restrictions: no brace needed per orders      Mobility Bed Mobility Overal bed mobility: Needs Assistance Bed Mobility: Sidelying to Sit;Sit to Sidelying   Sidelying to sit: Min guard     Sit to sidelying: Min guard General bed mobility  comments: increased time and effort; cues for log roll technique  Transfers Overall transfer level: Needs assistance Equipment used: None Transfers: Sit to/from Stand Sit to Stand: Min guard         General transfer comment: close guard for safety    Balance Overall balance assessment: Mild deficits observed, not formally tested                                         ADL either performed or assessed with clinical judgement   ADL Overall ADL's : Modified independent                                       General ADL Comments: Pt demonstrates ability to complete BADL at mod I level. Educated pt on spinal safe BADLs. He is familiar with this education from previous stay. Pt is able to attain figure 4 method for safe LB dressing. Educated on possible use of shower seat for improved safety with bathing after this admission. Pt does has grab bars in shower     Vision Baseline Vision/History: Wears glasses Wears Glasses: At all times Patient Visual Report: No change from baseline       Perception     Praxis      Pertinent Vitals/Pain Pain Assessment: Faces Faces Pain Scale: Hurts little more Pain Location: R leg Pain Descriptors / Indicators: Aching;Sore Pain Intervention(s): Monitored during session;Repositioned  Hand Dominance Right   Extremity/Trunk Assessment Upper Extremity Assessment Upper Extremity Assessment: Overall WFL for tasks assessed   Lower Extremity Assessment Lower Extremity Assessment: RLE deficits/detail RLE Deficits / Details: shooting pain with mobility       Communication Communication Communication: No difficulties   Cognition Arousal/Alertness: Awake/alert Behavior During Therapy: WFL for tasks assessed/performed Overall Cognitive Status: Within Functional Limits for tasks assessed                                     General Comments       Exercises     Shoulder Instructions       Home Living Family/patient expects to be discharged to:: Private residence Living Arrangements: Spouse/significant other Available Help at Discharge: Family Type of Home: House Home Access: Stairs to enter Technical brewer of Steps: 8 Entrance Stairs-Rails: Right Home Layout: Two level;Able to live on main level with bedroom/bathroom     Bathroom Shower/Tub: Tub only   Bathroom Toilet: Standard     Home Equipment: Cane - single point;Grab bars - tub/shower          Prior Functioning/Environment Level of Independence: Independent        Comments: pt independent prior to sxs, has been healing since recent lumbar sx in 06/21        OT Problem List: Decreased knowledge of use of DME or AE;Decreased knowledge of precautions;Impaired balance (sitting and/or standing);Pain      OT Treatment/Interventions:      OT Goals(Current goals can be found in the care plan section) Acute Rehab OT Goals Patient Stated Goal: hav eno more back surgeries OT Goal Formulation: All assessment and education complete, DC therapy  OT Frequency:     Barriers to D/C:            Co-evaluation              AM-PAC OT "6 Clicks" Daily Activity     Outcome Measure Help from another person eating meals?: None Help from another person taking care of personal grooming?: None Help from another person toileting, which includes using toliet, bedpan, or urinal?: None Help from another person bathing (including washing, rinsing, drying)?: None Help from another person to put on and taking off regular upper body clothing?: None Help from another person to put on and taking off regular lower body clothing?: None 6 Click Score: 24   End of Session Equipment Utilized During Treatment: Gait belt Nurse Communication: Mobility status;Patient requests pain meds  Activity Tolerance: Patient tolerated treatment well Patient left: in bed;with call bell/phone within reach;with family/visitor  present  OT Visit Diagnosis: Other abnormalities of gait and mobility (R26.89);Unsteadiness on feet (R26.81);Pain Pain - Right/Left: Right Pain - part of body: Leg                Time: 0721-8288 OT Time Calculation (min): 17 min Charges:  OT General Charges $OT Visit: 1 Visit OT Evaluation $OT Eval Moderate Complexity: [place], MSOT, OTR/L Acute Rehabilitation Services [facility] Office Number: [phone] Pager: [phone]  Zenovia Jarred 06/08/2020, 5:47 PM

## 2020-06-08 NOTE — Brief Op Note (Signed)
06/08/2020  9:25 AM  PATIENT:  Dennis Carey  85 y.o. male  PRE-OPERATIVE DIAGNOSIS:  Stenosis  POST-OPERATIVE DIAGNOSIS:  Stenosis  PROCEDURE:  Procedure(s): Right Lumbar Two-Three Laminectomy and Foraminotomy (Right)  SURGEON:  Surgeon(s) and Role:    [name], MD - Primary  PHYSICIAN ASSISTANT:   ASSISTANTSMearl Latin   ANESTHESIA:   general  EBL:  50 mL   BLOOD ADMINISTERED:none  DRAINS: none   LOCAL MEDICATIONS USED:  MARCAINE     SPECIMEN:  No Specimen  DISPOSITION OF SPECIMEN:  N/A  COUNTS:  YES  TOURNIQUET:  * No tourniquets in log *  DICTATION: .Dragon Dictation  PLAN OF CARE: Admit for overnight observation  PATIENT DISPOSITION:  PACU - hemodynamically stable.   Delay start of Pharmacological VTE agent (>24hrs) due to surgical blood loss or risk of bleeding: yes

## 2020-06-08 NOTE — Transfer of Care (Signed)
Immediate Anesthesia Transfer of Care Note  Patient: Dennis Carey  Procedure(s) Performed: Right Lumbar Two-Three Laminectomy and Foraminotomy (Right Spine Lumbar)  Patient Location: PACU  Anesthesia Type:General  Level of Consciousness: drowsy  Airway & Oxygen Therapy: Patient Spontanous Breathing and Patient connected to face mask oxygen  Post-op Assessment: Report given to RN and Post -op Vital signs reviewed and stable  Post vital signs: Reviewed and stable  Last Vitals:  Vitals Value Taken Time  BP 126/64 06/08/20 0941  Temp    Pulse 61 06/08/20 0945  Resp 9 06/08/20 0945  SpO2 100 % 06/08/20 0945  Vitals shown include unvalidated device data.  Last Pain:  Vitals:   06/08/20 0637  TempSrc:   PainSc: 6       Patients Stated Pain Goal: 3 (88/11/03 1594)  Complications: No complications documented.

## 2020-06-09 ENCOUNTER — Encounter (HOSPITAL_COMMUNITY): Payer: Self-pay | Admitting: Neurosurgery

## 2020-06-09 DIAGNOSIS — M5416 Radiculopathy, lumbar region: Secondary | ICD-10-CM | POA: Diagnosis not present

## 2020-06-09 NOTE — Care Management Obs Status (Signed)
[facility] NOTIFICATION   Patient Details  Name: ANSON PEDDIE MRN: [id number] Date of Birth: November 12, 1934   Medicare Observation Status Notification Given:  Yes    Carles Collet, RN 06/09/2020, 10:38 AM

## 2020-06-09 NOTE — Evaluation (Signed)
Physical Therapy Evaluation Patient Details Name: Dennis Carey MRN: [id number] DOB: 09-Feb-1935 Today's Date: 06/09/2020   History of Present Illness  85 y.o male s/p right L 2-3 lami and foraminotomy. PMH includes recent lumbar sx 04/20/20, CAD, CKD, HTN, and cervico-occipital neuralgia    Clinical Impression  Pt presented supine in bed with HOB elevated, awake and willing to participate in therapy session. Pt's spouse and daughter were present throughout session as well. Prior to admission, pt reported that he was independent with all functional mobility and ADLs. Pt lives with his spouse in a two level home with several steps to enter. He is able to live on the main level. At the time of evaluation, pt overall at a supervision to min guard level with all functional mobility including hallway ambulation with use of RW. He also participated in stair training this session without difficulties. PT provided pt education re: back precautions as they relate to functional mobility and a generalized walking program for pt to initiate upon d/c home. PT will continue to f/u with pt acutely to progress mobility as tolerated per PT POC.    Follow Up Recommendations No PT follow up    Equipment Recommendations  None recommended by PT    Recommendations for Other Services       Precautions / Restrictions Precautions Precautions: Fall;Back Precaution Comments: pt able to recall 3/3 back precautions independently Restrictions Weight Bearing Restrictions: No      Mobility  Bed Mobility Overal bed mobility: Needs Assistance Bed Mobility: Rolling;Sidelying to Sit Rolling: Supervision Sidelying to sit: Supervision       General bed mobility comments: increased time and effort; cues for log roll technique  Transfers Overall transfer level: Needs assistance Equipment used: Rolling walker (2 wheeled) Transfers: Sit to/from Stand Sit to Stand: Min guard         General transfer comment:  cueing for safe hand placement  Ambulation/Gait Ambulation/Gait assistance: Min guard Gait Distance (Feet): 200 Feet Assistive device: Rolling walker (2 wheeled) Gait Pattern/deviations: Step-through pattern;Decreased stride length;Trunk flexed Gait velocity: decreased   General Gait Details: cueing to maintain proximity to RW and for more upright posture; min guard for safety  Stairs Stairs: Yes Stairs assistance: Min guard Stair Management: One rail Left;Step to pattern;Forwards Number of Stairs: 5 General stair comments: cueing for sequencing to lead with L LE to ascend and R LE to descend  Wheelchair Mobility    Modified Rankin (Stroke Patients Only)       Balance Overall balance assessment: Needs assistance Sitting-balance support: Feet supported Sitting balance-Leahy Scale: Good     Standing balance support: During functional activity;Bilateral upper extremity supported;Single extremity supported Standing balance-Leahy Scale: Poor                               Pertinent Vitals/Pain Pain Assessment: 0-10 Pain Score: 3  Pain Location: R leg Pain Descriptors / Indicators: Aching;Sore Pain Intervention(s): Monitored during session;Repositioned    Home Living Family/patient expects to be discharged to:: Private residence Living Arrangements: Spouse/significant other Available Help at Discharge: Family Type of Home: House Home Access: Stairs to enter Entrance Stairs-Rails: Right Entrance Stairs-Number of Steps: 8 Home Layout: Two level;Able to live on main level with bedroom/bathroom Home Equipment: Cane - single point;Grab bars - tub/shower      Prior Function Level of Independence: Independent  Hand Dominance        Extremity/Trunk Assessment   Upper Extremity Assessment Upper Extremity Assessment: Defer to OT evaluation    Lower Extremity Assessment Lower Extremity Assessment: Generalized weakness    Cervical /  Trunk Assessment Cervical / Trunk Assessment: Other exceptions Cervical / Trunk Exceptions: s/p lumbar sx  Communication   Communication: No difficulties  Cognition Arousal/Alertness: Awake/alert Behavior During Therapy: WFL for tasks assessed/performed Overall Cognitive Status: Within Functional Limits for tasks assessed                                        General Comments      Exercises     Assessment/Plan    PT Assessment Patient needs continued PT services  PT Problem List Decreased strength;Decreased range of motion;Decreased activity tolerance;Decreased balance;Decreased mobility;Decreased coordination;Decreased knowledge of use of DME;Decreased safety awareness;Decreased knowledge of precautions;Pain       PT Treatment Interventions DME instruction;Gait training;Stair training;Balance training;Therapeutic activities;Functional mobility training;Therapeutic exercise;Neuromuscular re-education;Patient/family education    PT Goals (Current goals can be found in the Care Plan section)  Acute Rehab PT Goals Patient Stated Goal: "home today" PT Goal Formulation: With patient Time For Goal Achievement: 06/23/20 Potential to Achieve Goals: Good    Frequency Min 5X/week   Barriers to discharge        Co-evaluation               AM-PAC PT "6 Clicks" Mobility  Outcome Measure Help needed turning from your back to your side while in a flat bed without using bedrails?: None Help needed moving from lying on your back to sitting on the side of a flat bed without using bedrails?: None Help needed moving to and from a bed to a chair (including a wheelchair)?: None Help needed standing up from a chair using your arms (e.g., wheelchair or bedside chair)?: None Help needed to walk in hospital room?: A Little Help needed climbing 3-5 steps with a railing? : A Little 6 Click Score: 22    End of Session Equipment Utilized During Treatment: Gait  belt Activity Tolerance: Patient tolerated treatment well Patient left: with call bell/phone within reach;with family/visitor present;in chair Nurse Communication: Mobility status PT Visit Diagnosis: Other abnormalities of gait and mobility (R26.89)    Time: 8882-8003 PT Time Calculation (min) (ACUTE ONLY): 23 min   Charges:   PT Evaluation $PT Eval Low Complexity: 1 Low PT Treatments $Gait Training: 8-22 mins        Anastasio Champion, DPT  Acute Rehabilitation Services Pager [phone] Office [place] 06/09/2020, 9:27 AM

## 2020-06-09 NOTE — Progress Notes (Signed)
Patient is discharged from room 3C05 at this time. Alert and in stable condition. IV site d/c'd and instructions read to patient and family with understanding verbalized and all questions answered. Left unit via wheelchair with all belongings at side.

## 2020-06-09 NOTE — Discharge Instructions (Signed)

## 2020-06-09 NOTE — Discharge Summary (Signed)
Physician Discharge Summary  Patient ID: Dennis Carey MRN: [id number] DOB/AGE: November 06, 1935 85 y.o.  Admit date: 06/08/2020 Discharge date: 06/09/2020  Admission Diagnoses:  Discharge Diagnoses:  Active Problems:   Lumbar radiculopathy   Discharged Condition: good  Hospital Course: Patient admitted to the hospital where he underwent right-sided L2-3 decompressive laminotomy and foraminotomy.  Postoperatively doing well.  Preoperative right lower extremity pain much improved.  Ambulating without issue.  No headaches.  No wound issues.  Patient feels ready for discharge home.  Consults:   Significant Diagnostic Studies:   Treatments:   Discharge Exam: Blood pressure 105/74, pulse 72, temperature 97.6 F (36.4 C), temperature source Oral, resp. rate 16, height 5' 6.5" (1.689 m), weight 52 kg, SpO2 99 %. Awake and alert.  Oriented and appropriate.  Motor and sensory function intact.  Wound clean and dry.  Chest and abdomen benign.  Disposition: Discharge disposition: 01-Home or Self Care        Allergies as of 06/09/2020      Reactions   Sulfa Antibiotics Hives      Medication List    TAKE these medications   aspirin 81 MG tablet Take 81 mg by mouth daily.   clonazePAM 1 MG tablet Commonly known as: KLONOPIN Take 0.5 mg by mouth every 12 (twelve) hours.   ezetimibe 10 MG tablet Commonly known as: ZETIA Take 10 mg by mouth at bedtime.   HYDROcodone-acetaminophen 5-325 MG tablet Commonly known as: NORCO/VICODIN Take 1 tablet by mouth every 4 (four) hours as needed for pain.   pregabalin 75 MG capsule Commonly known as: LYRICA Take 75 mg by mouth 2 (two) times daily.   rosuvastatin 20 MG tablet Commonly known as: CRESTOR Take 20 mg by mouth at bedtime.        Signed: Cooper Render Laria Grimmett 06/09/2020, 10:22 AM

## 2020-07-02 DIAGNOSIS — M5416 Radiculopathy, lumbar region: Secondary | ICD-10-CM | POA: Diagnosis not present

## 2020-07-04 DIAGNOSIS — M5416 Radiculopathy, lumbar region: Secondary | ICD-10-CM | POA: Diagnosis not present

## 2020-07-09 DIAGNOSIS — M5416 Radiculopathy, lumbar region: Secondary | ICD-10-CM | POA: Diagnosis not present

## 2020-07-11 DIAGNOSIS — M5416 Radiculopathy, lumbar region: Secondary | ICD-10-CM | POA: Diagnosis not present

## 2020-07-17 DIAGNOSIS — M5416 Radiculopathy, lumbar region: Secondary | ICD-10-CM | POA: Diagnosis not present

## 2020-07-19 DIAGNOSIS — M5416 Radiculopathy, lumbar region: Secondary | ICD-10-CM | POA: Diagnosis not present

## 2020-07-23 DIAGNOSIS — M5416 Radiculopathy, lumbar region: Secondary | ICD-10-CM | POA: Diagnosis not present

## 2020-07-25 DIAGNOSIS — M5416 Radiculopathy, lumbar region: Secondary | ICD-10-CM | POA: Diagnosis not present

## 2020-07-30 DIAGNOSIS — M5416 Radiculopathy, lumbar region: Secondary | ICD-10-CM | POA: Diagnosis not present

## 2020-08-01 DIAGNOSIS — M5416 Radiculopathy, lumbar region: Secondary | ICD-10-CM | POA: Diagnosis not present

## 2020-08-05 DIAGNOSIS — Z23 Encounter for immunization: Secondary | ICD-10-CM | POA: Diagnosis not present

## 2020-08-06 DIAGNOSIS — M5416 Radiculopathy, lumbar region: Secondary | ICD-10-CM | POA: Diagnosis not present

## 2020-08-08 DIAGNOSIS — M5416 Radiculopathy, lumbar region: Secondary | ICD-10-CM | POA: Diagnosis not present

## 2020-08-13 DIAGNOSIS — M5416 Radiculopathy, lumbar region: Secondary | ICD-10-CM | POA: Diagnosis not present

## 2020-08-15 DIAGNOSIS — M5416 Radiculopathy, lumbar region: Secondary | ICD-10-CM | POA: Diagnosis not present

## 2020-08-21 DIAGNOSIS — M5416 Radiculopathy, lumbar region: Secondary | ICD-10-CM | POA: Diagnosis not present

## 2020-08-24 DIAGNOSIS — M5416 Radiculopathy, lumbar region: Secondary | ICD-10-CM | POA: Diagnosis not present

## 2020-08-25 DIAGNOSIS — Z23 Encounter for immunization: Secondary | ICD-10-CM | POA: Diagnosis not present

## 2020-08-28 DIAGNOSIS — M5416 Radiculopathy, lumbar region: Secondary | ICD-10-CM | POA: Diagnosis not present

## 2020-08-30 DIAGNOSIS — M5416 Radiculopathy, lumbar region: Secondary | ICD-10-CM | POA: Diagnosis not present

## 2020-09-03 DIAGNOSIS — M5416 Radiculopathy, lumbar region: Secondary | ICD-10-CM | POA: Diagnosis not present

## 2020-09-05 DIAGNOSIS — M5416 Radiculopathy, lumbar region: Secondary | ICD-10-CM | POA: Diagnosis not present

## 2020-09-11 DIAGNOSIS — M5416 Radiculopathy, lumbar region: Secondary | ICD-10-CM | POA: Diagnosis not present

## 2020-09-13 DIAGNOSIS — M5416 Radiculopathy, lumbar region: Secondary | ICD-10-CM | POA: Diagnosis not present

## 2020-09-17 ENCOUNTER — Other Ambulatory Visit: Payer: Self-pay | Admitting: Neurosurgery

## 2020-09-17 ENCOUNTER — Other Ambulatory Visit (HOSPITAL_COMMUNITY): Payer: Self-pay | Admitting: Neurosurgery

## 2020-09-17 DIAGNOSIS — M5416 Radiculopathy, lumbar region: Secondary | ICD-10-CM

## 2020-09-19 ENCOUNTER — Ambulatory Visit (HOSPITAL_COMMUNITY)
Admission: RE | Admit: 2020-09-19 | Discharge: 2020-09-19 | Disposition: A | Discharge status: Home / Self Care | Payer: Medicare Other | Source: Ambulatory Visit | Attending: Neurosurgery | Admitting: Neurosurgery

## 2020-09-19 DIAGNOSIS — M545 Low back pain, unspecified: Secondary | ICD-10-CM | POA: Diagnosis not present

## 2020-09-19 DIAGNOSIS — M5416 Radiculopathy, lumbar region: Secondary | ICD-10-CM

## 2020-09-19 MED ORDER — GADOBUTROL 1 MMOL/ML IV SOLN
5.0000 mL | Freq: Once | INTRAVENOUS | Status: AC | PRN
  Administered 2020-09-19: 5 mL via INTRAVENOUS

## 2020-09-20 ENCOUNTER — Other Ambulatory Visit: Payer: Self-pay | Admitting: Neurosurgery

## 2020-09-20 DIAGNOSIS — M5416 Radiculopathy, lumbar region: Secondary | ICD-10-CM

## 2020-09-21 ENCOUNTER — Other Ambulatory Visit: Payer: Self-pay

## 2020-09-21 ENCOUNTER — Other Ambulatory Visit: Payer: Self-pay | Admitting: Neurosurgery

## 2020-09-21 ENCOUNTER — Ambulatory Visit
Admission: RE | Admit: 2020-09-21 | Discharge: 2020-09-21 | Disposition: A | Discharge status: Home / Self Care | Payer: Medicare Other | Source: Ambulatory Visit | Attending: Neurosurgery | Admitting: Neurosurgery

## 2020-09-21 DIAGNOSIS — M5416 Radiculopathy, lumbar region: Secondary | ICD-10-CM | POA: Diagnosis not present

## 2020-09-21 MED ORDER — IOPAMIDOL (ISOVUE-M 200) INJECTION 41%
1.0000 mL | Freq: Once | INTRAMUSCULAR | Status: AC
  Administered 2020-09-21: 1 mL via EPIDURAL

## 2020-09-21 MED ORDER — METHYLPREDNISOLONE ACETATE 40 MG/ML INJ SUSP (RADIOLOG
120.0000 mg | Freq: Once | INTRAMUSCULAR | Status: AC
  Administered 2020-09-21: 120 mg via EPIDURAL

## 2020-09-21 NOTE — Discharge Instructions (Signed)

## 2020-09-26 ENCOUNTER — Other Ambulatory Visit: Payer: Self-pay | Admitting: Neurosurgery

## 2020-09-26 DIAGNOSIS — M48061 Spinal stenosis, lumbar region without neurogenic claudication: Secondary | ICD-10-CM | POA: Diagnosis not present

## 2020-09-27 ENCOUNTER — Encounter (HOSPITAL_COMMUNITY): Payer: Self-pay | Admitting: Neurosurgery

## 2020-09-27 ENCOUNTER — Other Ambulatory Visit: Payer: Self-pay

## 2020-09-27 ENCOUNTER — Inpatient Hospital Stay (HOSPITAL_COMMUNITY): Admission: RE | Admit: 2020-09-27 | Payer: Medicare Other | Source: Ambulatory Visit

## 2020-09-27 NOTE — Anesthesia Preprocedure Evaluation (Addendum)
Anesthesia Evaluation  Patient identified by MRN, date of birth, ID band Patient awake    Reviewed: Patient's Chart, lab work & pertinent test results  Airway Mallampati: II  TM Distance: >3 FB Neck ROM: Full    Dental  (+) Teeth Intact   Pulmonary neg pulmonary ROS,    Pulmonary exam normal        Cardiovascular hypertension, + CAD   Rhythm:Regular Rate:Normal     Neuro/Psych Anxiety negative neurological ROS     GI/Hepatic negative GI ROS, Neg liver ROS,   Endo/Other  negative endocrine ROS  Renal/GU CRFRenal disease   PSA elevation negative genitourinary   Musculoskeletal  (+) Arthritis , Osteoarthritis,  Lumbar stenosis   Abdominal (+)  Abdomen: soft. Bowel sounds: normal.  Peds  Hematology negative hematology ROS (+)   Anesthesia Other Findings   Reproductive/Obstetrics                             Anesthesia Physical Anesthesia Plan  ASA: III  Anesthesia Plan: General   Post-op Pain Management:    Induction: Intravenous  PONV Risk Score and Plan: 2 and Ondansetron, Dexamethasone and Treatment may vary due to age or medical condition  Airway Management Planned: Mask and Oral ETT  Additional Equipment: None  Intra-op Plan:   Post-operative Plan: Extubation in OR  Informed Consent: I have reviewed the patients History and Physical, chart, labs and discussed the procedure including the risks, benefits and alternatives for the proposed anesthesia with the patient or authorized representative who has indicated his/her understanding and acceptance.     Dental advisory given  Plan Discussed with: CRNA  Anesthesia Plan Comments: (PAT note written 09/27/2020 by Myra Gianotti, PA-C. - HLD, CKD III, elevated PSA, dental implants, HTN, CAD based on calcium scoring but no further testing available )       Anesthesia Quick Evaluation

## 2020-09-27 NOTE — Progress Notes (Signed)
Anesthesia Chart Review:  Case: [id number] Date/Time: 09/28/20 1345   Procedure: PLIF - L5-S1 (N/A Back)   Anesthesia type: General   Pre-op diagnosis: Stenosis   Location: [facility] OR ROOM 21 / [facility]   Surgeons: [name], MD      DISCUSSION: Dennis Carey is an 85 year old male scheduled for the above procedure.   History includes never smoker, HLD, CKD stage III, dental implants, elevated PSA. HTN is listed, but he denied history. Reportedly was told he had evidence of CAD by calcium score and has been treated with statin therapy.  - S/p right L5-S1 laminotomy and foraminotomy 04/20/20. - S/p right L2-3 decompressive laminotomy and foraminotomy 06/08/20.   Although he has had an elevated glucose with recent labs, he reportedly denied history of DM2 or pre-DM but said Dr. Philip Aspen was monitoring. He will get a BMET with his day of surgery labs.  Cr ~ 1.3-1.5 with labs in June and July.   Patient tolerated two lumber surgeries within the past six months. [place] COVID-19 vaccine 12/22/19. He is for same day COVID-19 testing. Anesthesia team to evaluate on the day of surgery.    VS:  Wt Readings from Last 3 Encounters:  06/08/20 52 kg  04/06/20 56 kg  09/08/18 70.3 kg   BP Readings from Last 3 Encounters:  09/21/20 137/73  06/09/20 105/74  05/29/20 127/69   Pulse Readings from Last 3 Encounters:  09/21/20 78  06/09/20 72  05/29/20 76     PROVIDERS: [name], MD is PCP  Sarina Ill, MD is neurologist   LABS: For day of surgery. As of 06/08/20, CBC WNL, Cr 1.31 (was 1.50 04/19/20), glucose 201.    IMAGES: MRI L-spine 09/29/20: IMPRESSION: 1. Status post right L2-3 laminectomy with fluid in the laminectomy bed, consistent with seroma. No residual spinal canal stenosis. 2. Unchanged multilevel moderate-to-severe neural foraminal stenosis, worst at right L5-S1 where there is enhancing granulation tissue adjacent to the L5 nerve root in the neural foramen.   EKG:  04/19/20: NSR   CV: Reportedly had a Coronary calcium score done in the past (in or before 2019). Results are currently unavailable.    Past Medical History:  Diagnosis Date  . Anxiety   . CAD (coronary artery disease)    significant CAD by cardiac calcium scoring test  . CKD (chronic kidney disease), stage III ([place])    by serum creatinine is stable  . HTN (hypertension)    pt denies   . Hyperlipidemia   . Microhematuria   . PSA elevation 2014   4.75 February 2017, 6.9 in 2/18 and declines urologist eval or trial of antibiotics    Past Surgical History:  Procedure Laterality Date  . dental implants     3 of them 2008  . LUMBAR LAMINECTOMY/DECOMPRESSION MICRODISCECTOMY Right 04/20/2020   Procedure: Laminectomy and Foraminotomy - right - Lumbar five-Sacral one;  Surgeon: [name], MD;  Location: [facility];  Service: Neurosurgery;  Laterality: Right;  . LUMBAR LAMINECTOMY/DECOMPRESSION MICRODISCECTOMY Right 06/08/2020   Procedure: Right Lumbar Two-Three Laminectomy and Foraminotomy;  Surgeon: [name], MD;  Location: [facility];  Service: Neurosurgery;  Laterality: Right;  . submucous resection nose     1956    MEDICATIONS: No current facility-administered medications for this encounter.   Marland Kitchen aspirin 81 MG tablet  . docusate sodium (COLACE) 100 MG capsule  . DULoxetine (CYMBALTA) 20 MG capsule  . ezetimibe (ZETIA) 10 MG tablet  . HYDROcodone-acetaminophen (NORCO/VICODIN) 5-325  MG tablet  . pregabalin (LYRICA) 75 MG capsule  . rosuvastatin (CRESTOR) 20 MG tablet    Myra Gianotti, PA-C Surgical Short Stay/Anesthesiology [facility] Phone [phone] Operating Room Services Phone [phone] 09/27/2020 3:30 PM

## 2020-09-27 NOTE — Progress Notes (Addendum)
[name] denies chest pain nor shortness of breath. Patient denies any s/s of Covid and has not been in contact with anyone with it.  [name] is in extreme pain, patient will have Covid test on arrival.   I asked [name] about the elevated glucose on labs 6/21 and 7/21 and if PCP , Dr. Rogers Seeds has commented on it, Dr. Sharlett Iles is keeping an eye on labs, including creatine, no diagnosis of diabetes or pre-diabetes have been made.   I asked Allision Zelenak, PA-C to review.

## 2020-09-28 ENCOUNTER — Encounter (HOSPITAL_COMMUNITY): Payer: Self-pay | Admitting: Neurosurgery

## 2020-09-28 ENCOUNTER — Ambulatory Visit (HOSPITAL_COMMUNITY): Payer: Medicare Other

## 2020-09-28 ENCOUNTER — Other Ambulatory Visit: Payer: Self-pay

## 2020-09-28 ENCOUNTER — Ambulatory Visit (HOSPITAL_COMMUNITY): Payer: Medicare Other | Admitting: Vascular Surgery

## 2020-09-28 ENCOUNTER — Encounter (HOSPITAL_COMMUNITY)
Admission: RE | Disposition: A | Discharge status: Home / Self Care | Payer: Self-pay | Source: Home / Self Care | Attending: Neurosurgery

## 2020-09-28 ENCOUNTER — Observation Stay (HOSPITAL_COMMUNITY)
Admission: RE | Admit: 2020-09-28 | Discharge: 2020-09-29 | Disposition: A | Discharge status: Home / Self Care | Payer: Medicare Other | Attending: Neurosurgery | Admitting: Neurosurgery

## 2020-09-28 DIAGNOSIS — Z0389 Encounter for observation for other suspected diseases and conditions ruled out: Secondary | ICD-10-CM | POA: Diagnosis not present

## 2020-09-28 DIAGNOSIS — M79604 Pain in right leg: Secondary | ICD-10-CM | POA: Diagnosis present

## 2020-09-28 DIAGNOSIS — Z20822 Contact with and (suspected) exposure to covid-19: Secondary | ICD-10-CM | POA: Insufficient documentation

## 2020-09-28 DIAGNOSIS — M9983 Other biomechanical lesions of lumbar region: Secondary | ICD-10-CM | POA: Diagnosis not present

## 2020-09-28 DIAGNOSIS — I2510 Atherosclerotic heart disease of native coronary artery without angina pectoris: Secondary | ICD-10-CM | POA: Diagnosis not present

## 2020-09-28 DIAGNOSIS — Z79899 Other long term (current) drug therapy: Secondary | ICD-10-CM | POA: Insufficient documentation

## 2020-09-28 DIAGNOSIS — Z7982 Long term (current) use of aspirin: Secondary | ICD-10-CM | POA: Insufficient documentation

## 2020-09-28 DIAGNOSIS — M48061 Spinal stenosis, lumbar region without neurogenic claudication: Principal | ICD-10-CM | POA: Diagnosis present

## 2020-09-28 DIAGNOSIS — N183 Chronic kidney disease, stage 3 unspecified: Secondary | ICD-10-CM | POA: Diagnosis not present

## 2020-09-28 DIAGNOSIS — E785 Hyperlipidemia, unspecified: Secondary | ICD-10-CM | POA: Diagnosis not present

## 2020-09-28 DIAGNOSIS — Z419 Encounter for procedure for purposes other than remedying health state, unspecified: Secondary | ICD-10-CM

## 2020-09-28 DIAGNOSIS — I129 Hypertensive chronic kidney disease with stage 1 through stage 4 chronic kidney disease, or unspecified chronic kidney disease: Secondary | ICD-10-CM | POA: Diagnosis not present

## 2020-09-28 DIAGNOSIS — M5416 Radiculopathy, lumbar region: Secondary | ICD-10-CM | POA: Diagnosis not present

## 2020-09-28 DIAGNOSIS — M5116 Intervertebral disc disorders with radiculopathy, lumbar region: Secondary | ICD-10-CM | POA: Diagnosis not present

## 2020-09-28 DIAGNOSIS — M5417 Radiculopathy, lumbosacral region: Secondary | ICD-10-CM | POA: Diagnosis not present

## 2020-09-28 DIAGNOSIS — F419 Anxiety disorder, unspecified: Secondary | ICD-10-CM | POA: Diagnosis not present

## 2020-09-28 HISTORY — DX: Anxiety disorder, unspecified: F41.9

## 2020-09-28 LAB — CBC WITH DIFFERENTIAL/PLATELET
Abs Immature Granulocytes: 0.02 10*3/uL (ref 0.00–0.07)
Basophils Absolute: 0.1 10*3/uL (ref 0.0–0.1)
Basophils Relative: 1 %
Eosinophils Absolute: 0.2 10*3/uL (ref 0.0–0.5)
Eosinophils Relative: 3 %
HCT: 42.7 % (ref 39.0–52.0)
Hemoglobin: 13.7 g/dL (ref 13.0–17.0)
Immature Granulocytes: 0 %
Lymphocytes Relative: 32 %
Lymphs Abs: 2 10*3/uL (ref 0.7–4.0)
MCH: 29.7 pg (ref 26.0–34.0)
MCHC: 32.1 g/dL (ref 30.0–36.0)
MCV: 92.6 fL (ref 80.0–100.0)
Monocytes Absolute: 0.8 10*3/uL (ref 0.1–1.0)
Monocytes Relative: 12 %
Neutro Abs: 3.2 10*3/uL (ref 1.7–7.7)
Neutrophils Relative %: 52 %
Platelets: 183 10*3/uL (ref 150–400)
RBC: 4.61 MIL/uL (ref 4.22–5.81)
RDW: 11.9 % (ref 11.5–15.5)
WBC: 6.2 10*3/uL (ref 4.0–10.5)
nRBC: 0 % (ref 0.0–0.2)

## 2020-09-28 LAB — TYPE AND SCREEN
ABO/RH(D): A POS
Antibody Screen: NEGATIVE

## 2020-09-28 LAB — BASIC METABOLIC PANEL
Anion gap: 9 (ref 5–15)
BUN: 22 mg/dL (ref 8–23)
CO2: 25 mmol/L (ref 22–32)
Calcium: 8.9 mg/dL (ref 8.9–10.3)
Chloride: 104 mmol/L (ref 98–111)
Creatinine, Ser: 1.44 mg/dL — ABNORMAL HIGH (ref 0.61–1.24)
GFR, Estimated: 48 mL/min — ABNORMAL LOW (ref >60)
Glucose, Bld: 132 mg/dL — ABNORMAL HIGH (ref 70–99)
Potassium: 4.1 mmol/L (ref 3.5–5.1)
Sodium: 138 mmol/L (ref 135–145)

## 2020-09-28 LAB — SARS CORONAVIRUS 2 BY RT PCR (HOSPITAL ORDER, PERFORMED IN ~~LOC~~ HOSPITAL LAB): SARS Coronavirus 2: NEGATIVE

## 2020-09-28 LAB — ABO/RH: ABO/RH(D): A POS

## 2020-09-28 SURGERY — POSTERIOR LUMBAR FUSION 1 LEVEL
Anesthesia: General | Site: Spine Lumbar

## 2020-09-28 MED ORDER — ONDANSETRON HCL 4 MG/2ML IJ SOLN: 4.0000 mg | Freq: Once | INTRAMUSCULAR | Status: DC | PRN

## 2020-09-28 MED ORDER — SODIUM CHLORIDE 0.9% FLUSH
3.0000 mL | Freq: Two times a day (BID) | INTRAVENOUS | Status: DC
  Administered 2020-09-28: 3 mL via INTRAVENOUS

## 2020-09-28 MED ORDER — PREGABALIN 75 MG PO CAPS
75.0000 mg | ORAL_CAPSULE | Freq: Two times a day (BID) | ORAL | Status: DC
  Administered 2020-09-28 – 2020-09-29 (×2): 75 mg via ORAL
  Filled 2020-09-28 (×2): qty 1

## 2020-09-28 MED ORDER — HYDROMORPHONE HCL 1 MG/ML IJ SOLN
INTRAMUSCULAR | Status: AC
  Administered 2020-09-28: 0.5 mg via INTRAVENOUS
  Filled 2020-09-28: qty 1

## 2020-09-28 MED ORDER — LACTATED RINGERS IV SOLN: INTRAVENOUS | Status: DC

## 2020-09-28 MED ORDER — VANCOMYCIN HCL 1000 MG IV SOLR
INTRAVENOUS | Status: AC
  Filled 2020-09-28: qty 1000

## 2020-09-28 MED ORDER — ROSUVASTATIN CALCIUM 20 MG PO TABS
20.0000 mg | ORAL_TABLET | Freq: Every day | ORAL | Status: DC
  Administered 2020-09-28: 20 mg via ORAL
  Filled 2020-09-28: qty 1

## 2020-09-28 MED ORDER — BUPIVACAINE HCL (PF) 0.25 % IJ SOLN
INTRAMUSCULAR | Status: AC
  Filled 2020-09-28: qty 30

## 2020-09-28 MED ORDER — ROCURONIUM BROMIDE 10 MG/ML (PF) SYRINGE
PREFILLED_SYRINGE | INTRAVENOUS | Status: DC | PRN
  Administered 2020-09-28: 20 mg via INTRAVENOUS
  Administered 2020-09-28: 60 mg via INTRAVENOUS
  Administered 2020-09-28: 20 mg via INTRAVENOUS

## 2020-09-28 MED ORDER — ACETAMINOPHEN 325 MG PO TABS: 650.0000 mg | ORAL_TABLET | ORAL | Status: DC | PRN

## 2020-09-28 MED ORDER — POLYETHYLENE GLYCOL 3350 17 G PO PACK: 17.0000 g | PACK | Freq: Every day | ORAL | Status: DC | PRN

## 2020-09-28 MED ORDER — DOCUSATE SODIUM 100 MG PO CAPS
100.0000 mg | ORAL_CAPSULE | Freq: Three times a day (TID) | ORAL | Status: DC
  Administered 2020-09-28 – 2020-09-29 (×2): 100 mg via ORAL
  Filled 2020-09-28 (×2): qty 1

## 2020-09-28 MED ORDER — ACETAMINOPHEN 650 MG RE SUPP: 650.0000 mg | RECTAL | Status: DC | PRN

## 2020-09-28 MED ORDER — HYDROMORPHONE HCL 1 MG/ML IJ SOLN
INTRAMUSCULAR | Status: AC
  Filled 2020-09-28: qty 1

## 2020-09-28 MED ORDER — HYDROMORPHONE HCL 1 MG/ML IJ SOLN: 0.5000 mg | Freq: Once | INTRAMUSCULAR | Status: AC

## 2020-09-28 MED ORDER — 0.9 % SODIUM CHLORIDE (POUR BTL) OPTIME
TOPICAL | Status: DC | PRN
  Administered 2020-09-28: 1000 mL

## 2020-09-28 MED ORDER — PROPOFOL 10 MG/ML IV BOLUS
INTRAVENOUS | Status: AC
  Filled 2020-09-28: qty 20

## 2020-09-28 MED ORDER — ONDANSETRON HCL 4 MG/2ML IJ SOLN
INTRAMUSCULAR | Status: DC | PRN
  Administered 2020-09-28: 4 mg via INTRAVENOUS

## 2020-09-28 MED ORDER — DEXAMETHASONE SODIUM PHOSPHATE 10 MG/ML IJ SOLN: 10.0000 mg | Freq: Once | INTRAMUSCULAR | Status: DC

## 2020-09-28 MED ORDER — LIDOCAINE 2% (20 MG/ML) 5 ML SYRINGE
INTRAMUSCULAR | Status: DC | PRN
  Administered 2020-09-28: 60 mg via INTRAVENOUS

## 2020-09-28 MED ORDER — ACETAMINOPHEN 10 MG/ML IV SOLN
INTRAVENOUS | Status: AC
  Filled 2020-09-28: qty 100

## 2020-09-28 MED ORDER — VANCOMYCIN HCL 1000 MG IV SOLR
INTRAVENOUS | Status: DC | PRN
  Administered 2020-09-28: 1000 mg via TOPICAL

## 2020-09-28 MED ORDER — SODIUM CHLORIDE 0.9 % IV SOLN: 250.0000 mL | INTRAVENOUS | Status: DC

## 2020-09-28 MED ORDER — PHENOL 1.4 % MT LIQD: 1.0000 | OROMUCOSAL | Status: DC | PRN

## 2020-09-28 MED ORDER — ONDANSETRON HCL 4 MG PO TABS: 4.0000 mg | ORAL_TABLET | Freq: Four times a day (QID) | ORAL | Status: DC | PRN

## 2020-09-28 MED ORDER — BUPIVACAINE HCL (PF) 0.25 % IJ SOLN
INTRAMUSCULAR | Status: DC | PRN
  Administered 2020-09-28: 30 mL

## 2020-09-28 MED ORDER — THROMBIN 20000 UNITS EX SOLR
CUTANEOUS | Status: AC
  Filled 2020-09-28: qty 20000

## 2020-09-28 MED ORDER — DIAZEPAM 5 MG PO TABS
5.0000 mg | ORAL_TABLET | Freq: Four times a day (QID) | ORAL | Status: DC | PRN
  Administered 2020-09-29: 5 mg via ORAL
  Filled 2020-09-28: qty 1

## 2020-09-28 MED ORDER — LIDOCAINE HCL (PF) 2 % IJ SOLN
INTRAMUSCULAR | Status: AC
  Filled 2020-09-28: qty 5

## 2020-09-28 MED ORDER — DULOXETINE HCL 20 MG PO CPEP
20.0000 mg | ORAL_CAPSULE | Freq: Two times a day (BID) | ORAL | Status: DC
  Administered 2020-09-28: 20 mg via ORAL
  Filled 2020-09-28 (×2): qty 1

## 2020-09-28 MED ORDER — ONDANSETRON HCL 4 MG/2ML IJ SOLN
INTRAMUSCULAR | Status: AC
  Filled 2020-09-28: qty 2

## 2020-09-28 MED ORDER — SUGAMMADEX SODIUM 200 MG/2ML IV SOLN
INTRAVENOUS | Status: DC | PRN
  Administered 2020-09-28: 200 mg via INTRAVENOUS

## 2020-09-28 MED ORDER — CEFAZOLIN SODIUM-DEXTROSE 2-4 GM/100ML-% IV SOLN
2.0000 g | INTRAVENOUS | Status: AC
  Administered 2020-09-28: 2 g via INTRAVENOUS
  Filled 2020-09-28: qty 100

## 2020-09-28 MED ORDER — EZETIMIBE 10 MG PO TABS
10.0000 mg | ORAL_TABLET | Freq: Every day | ORAL | Status: DC
  Administered 2020-09-28: 10 mg via ORAL
  Filled 2020-09-28 (×2): qty 1

## 2020-09-28 MED ORDER — CEFAZOLIN SODIUM-DEXTROSE 1-4 GM/50ML-% IV SOLN
1.0000 g | Freq: Two times a day (BID) | INTRAVENOUS | Status: DC
  Administered 2020-09-28: 1 g via INTRAVENOUS
  Filled 2020-09-28: qty 50

## 2020-09-28 MED ORDER — BISACODYL 10 MG RE SUPP: 10.0000 mg | Freq: Every day | RECTAL | Status: DC | PRN

## 2020-09-28 MED ORDER — DEXAMETHASONE SODIUM PHOSPHATE 10 MG/ML IJ SOLN
INTRAMUSCULAR | Status: AC
  Filled 2020-09-28: qty 1

## 2020-09-28 MED ORDER — FLEET ENEMA 7-19 GM/118ML RE ENEM: 1.0000 | ENEMA | Freq: Once | RECTAL | Status: DC | PRN

## 2020-09-28 MED ORDER — SODIUM CHLORIDE 0.9% FLUSH: 3.0000 mL | INTRAVENOUS | Status: DC | PRN

## 2020-09-28 MED ORDER — TAMSULOSIN HCL 0.4 MG PO CAPS
0.4000 mg | ORAL_CAPSULE | Freq: Every day | ORAL | Status: DC
  Administered 2020-09-28 – 2020-09-29 (×2): 0.4 mg via ORAL
  Filled 2020-09-28 (×2): qty 1

## 2020-09-28 MED ORDER — OXYCODONE HCL 5 MG PO TABS
10.0000 mg | ORAL_TABLET | ORAL | Status: DC | PRN
  Administered 2020-09-29: 10 mg via ORAL
  Filled 2020-09-28: qty 2

## 2020-09-28 MED ORDER — HYDROCODONE-ACETAMINOPHEN 10-325 MG PO TABS
1.0000 | ORAL_TABLET | ORAL | Status: DC | PRN
  Administered 2020-09-28: 1 via ORAL
  Filled 2020-09-28: qty 1

## 2020-09-28 MED ORDER — ROCURONIUM BROMIDE 10 MG/ML (PF) SYRINGE
PREFILLED_SYRINGE | INTRAVENOUS | Status: AC
  Filled 2020-09-28: qty 10

## 2020-09-28 MED ORDER — CHLORHEXIDINE GLUCONATE 0.12 % MT SOLN: 15.0000 mL | Freq: Once | OROMUCOSAL | Status: DC

## 2020-09-28 MED ORDER — PHENYLEPHRINE HCL-NACL 10-0.9 MG/250ML-% IV SOLN
INTRAVENOUS | Status: DC | PRN
  Administered 2020-09-28: 40 ug/min via INTRAVENOUS

## 2020-09-28 MED ORDER — DEXAMETHASONE SODIUM PHOSPHATE 10 MG/ML IJ SOLN
INTRAMUSCULAR | Status: DC | PRN
  Administered 2020-09-28: 5 mg via INTRAVENOUS

## 2020-09-28 MED ORDER — EPHEDRINE SULFATE-NACL 50-0.9 MG/10ML-% IV SOSY
PREFILLED_SYRINGE | INTRAVENOUS | Status: DC | PRN
  Administered 2020-09-28: 5 mg via INTRAVENOUS
  Administered 2020-09-28 (×2): 10 mg via INTRAVENOUS

## 2020-09-28 MED ORDER — EPHEDRINE 5 MG/ML INJ
INTRAVENOUS | Status: AC
  Filled 2020-09-28: qty 10

## 2020-09-28 MED ORDER — PROPOFOL 10 MG/ML IV BOLUS
INTRAVENOUS | Status: DC | PRN
  Administered 2020-09-28: 160 mg via INTRAVENOUS

## 2020-09-28 MED ORDER — FENTANYL CITRATE (PF) 250 MCG/5ML IJ SOLN
INTRAMUSCULAR | Status: AC
  Filled 2020-09-28: qty 5

## 2020-09-28 MED ORDER — MENTHOL 3 MG MT LOZG: 1.0000 | LOZENGE | OROMUCOSAL | Status: DC | PRN

## 2020-09-28 MED ORDER — HYDROMORPHONE HCL 1 MG/ML IJ SOLN
0.2500 mg | INTRAMUSCULAR | Status: DC | PRN
  Administered 2020-09-28 (×3): 0.5 mg via INTRAVENOUS

## 2020-09-28 MED ORDER — ACETAMINOPHEN 10 MG/ML IV SOLN
1000.0000 mg | Freq: Once | INTRAVENOUS | Status: DC | PRN
  Administered 2020-09-28: 1000 mg via INTRAVENOUS

## 2020-09-28 MED ORDER — ONDANSETRON HCL 4 MG/2ML IJ SOLN: 4.0000 mg | Freq: Four times a day (QID) | INTRAMUSCULAR | Status: DC | PRN

## 2020-09-28 MED ORDER — CHLORHEXIDINE GLUCONATE 0.12 % MT SOLN
OROMUCOSAL | Status: AC
  Administered 2020-09-28: 15 mL
  Filled 2020-09-28: qty 15

## 2020-09-28 MED ORDER — ORAL CARE MOUTH RINSE: 15.0000 mL | Freq: Once | OROMUCOSAL | Status: DC

## 2020-09-28 MED ORDER — CHLORHEXIDINE GLUCONATE CLOTH 2 % EX PADS: 6.0000 | MEDICATED_PAD | Freq: Once | CUTANEOUS | Status: DC

## 2020-09-28 MED ORDER — FENTANYL CITRATE (PF) 250 MCG/5ML IJ SOLN
INTRAMUSCULAR | Status: DC | PRN
  Administered 2020-09-28 (×4): 50 ug via INTRAVENOUS

## 2020-09-28 MED ORDER — THROMBIN 20000 UNITS EX SOLR: CUTANEOUS | Status: DC | PRN

## 2020-09-28 MED ORDER — HYDROMORPHONE HCL 1 MG/ML IJ SOLN: 1.0000 mg | INTRAMUSCULAR | Status: DC | PRN

## 2020-09-28 SURGICAL SUPPLY — 50 items
BENZOIN TINCTURE PRP APPL 2/3 (GAUZE/BANDAGES/DRESSINGS) ×2 IMPLANT
BUR MATCHSTICK NEURO 3.0 LAGG (BURR) ×2 IMPLANT
CAGE EXP CATALYFT 9 (Plate) ×4 IMPLANT
CANISTER SUCT 3000ML PPV (MISCELLANEOUS) ×2 IMPLANT
CAP LCK SPNE (Orthopedic Implant) ×4 IMPLANT
CAP LOCK SPINE RADIUS (Orthopedic Implant) ×4 IMPLANT
CAP LOCKING (Orthopedic Implant) ×8 IMPLANT
CARTRIDGE OIL MAESTRO DRILL (MISCELLANEOUS) ×1 IMPLANT
CNTNR URN SCR LID CUP LEK RST (MISCELLANEOUS) ×1 IMPLANT
CONT SPEC 4OZ STRL OR WHT (MISCELLANEOUS) ×2
COVER BACK TABLE 60X90IN (DRAPES) ×2 IMPLANT
DERMABOND ADVANCED (GAUZE/BANDAGES/DRESSINGS) ×1
DERMABOND ADVANCED .7 DNX12 (GAUZE/BANDAGES/DRESSINGS) ×1 IMPLANT
DIFFUSER DRILL AIR PNEUMATIC (MISCELLANEOUS) ×2 IMPLANT
DRAPE C-ARM 42X72 X-RAY (DRAPES) ×4 IMPLANT
DRAPE HALF SHEET 40X57 (DRAPES) IMPLANT
DRAPE LAPAROTOMY 100X72X124 (DRAPES) ×2 IMPLANT
DRAPE SURG 17X23 STRL (DRAPES) ×8 IMPLANT
DRSG OPSITE POSTOP 4X6 (GAUZE/BANDAGES/DRESSINGS) ×2 IMPLANT
DURAPREP 26ML APPLICATOR (WOUND CARE) ×2 IMPLANT
ELECT REM PT RETURN 9FT ADLT (ELECTROSURGICAL) ×2
ELECTRODE REM PT RTRN 9FT ADLT (ELECTROSURGICAL) ×1 IMPLANT
EVACUATOR 1/8 PVC DRAIN (DRAIN) IMPLANT
GLOVE ECLIPSE 9.0 STRL (GLOVE) ×6 IMPLANT
GOWN STRL REUS W/ TWL LRG LVL3 (GOWN DISPOSABLE) ×3 IMPLANT
GOWN STRL REUS W/ TWL XL LVL3 (GOWN DISPOSABLE) ×3 IMPLANT
GOWN STRL REUS W/TWL LRG LVL3 (GOWN DISPOSABLE) ×6
GOWN STRL REUS W/TWL XL LVL3 (GOWN DISPOSABLE) ×6
KIT BASIN OR (CUSTOM PROCEDURE TRAY) ×2 IMPLANT
KIT TURNOVER KIT B (KITS) ×2 IMPLANT
MILL MEDIUM DISP (BLADE) ×2 IMPLANT
NEEDLE HYPO 22GX1.5 SAFETY (NEEDLE) ×2 IMPLANT
NS IRRIG 1000ML POUR BTL (IV SOLUTION) ×2 IMPLANT
OIL CARTRIDGE MAESTRO DRILL (MISCELLANEOUS) ×2
PACK LAMINECTOMY NEURO (CUSTOM PROCEDURE TRAY) ×2 IMPLANT
PASTE BONE GRAFTON 1CC (Bone Implant) ×2 IMPLANT
ROD RADIUS 35MM (Rod) ×4 IMPLANT
SCREW 5.75X40M (Screw) ×2 IMPLANT
SCREW 5.75X45MM (Screw) ×4 IMPLANT
SCREW 6.75X35MM (Screw) ×2 IMPLANT
SPONGE SURGIFOAM ABS GEL 100 (HEMOSTASIS) ×2 IMPLANT
STRIP CLOSURE SKIN 1/2X4 (GAUZE/BANDAGES/DRESSINGS) ×2 IMPLANT
SUT VIC AB 0 CT1 18XCR BRD8 (SUTURE) ×1 IMPLANT
SUT VIC AB 0 CT1 8-18 (SUTURE) ×2
SUT VIC AB 2-0 CT1 18 (SUTURE) ×2 IMPLANT
SUT VIC AB 3-0 SH 8-18 (SUTURE) ×2 IMPLANT
TOWEL GREEN STERILE (TOWEL DISPOSABLE) ×2 IMPLANT
TOWEL GREEN STERILE FF (TOWEL DISPOSABLE) ×2 IMPLANT
TRAY FOLEY MTR SLVR 16FR STAT (SET/KITS/TRAYS/PACK) ×2 IMPLANT
WATER STERILE IRR 1000ML POUR (IV SOLUTION) ×2 IMPLANT

## 2020-09-28 NOTE — Anesthesia Procedure Notes (Signed)
Procedure Name: Intubation Date/Time: 09/28/2020 3:08 PM Performed by: Janene Harvey, CRNA Pre-anesthesia Checklist: Patient identified, Emergency Drugs available, Suction available and Patient being monitored Patient Re-evaluated:Patient Re-evaluated prior to induction Oxygen Delivery Method: Circle system utilized Preoxygenation: Pre-oxygenation with 100% oxygen Induction Type: IV induction Ventilation: Mask ventilation without difficulty Laryngoscope Size: Mac and 4 Grade View: Grade II Tube type: Oral Tube size: 7.5 mm Number of attempts: 1 Airway Equipment and Method: Stylet and Oral airway Placement Confirmation: ETT inserted through vocal cords under direct vision,  positive ETCO2 and breath sounds checked- equal and bilateral Secured at: 21 cm Tube secured with: Tape Dental Injury: Teeth and Oropharynx as per pre-operative assessment

## 2020-09-28 NOTE — H&P (Signed)
Dennis Carey is an 85 y.o. male.   Chief Complaint: Right leg pain HPI: 85 year old male with severe right lower extremity L5 radicular pain which is failed all efforts of conservative management.  Patient status post prior L5-S1 decompressive foraminotomies and right-sided L4-5 decompressive foraminotomies without relief.  Patient with follow-up imaging demonstrated evidence of marked disc space and foraminal collapse on the right side at L5-S1 with ongoing compression of his exiting right L5 nerve root.  Patient presents now for decompression and fusion in hopes of improving his symptoms.  Past Medical History:  Diagnosis Date  . Anxiety   . CAD (coronary artery disease)    significant CAD by cardiac calcium scoring test  . CKD (chronic kidney disease), stage III ([place])    by serum creatinine is stable  . HTN (hypertension)    pt denies   . Hyperlipidemia   . Microhematuria   . PSA elevation 2014   4.75 February 2017, 6.9 in 2/18 and declines urologist eval or trial of antibiotics    Past Surgical History:  Procedure Laterality Date  . dental implants     3 of them 2008  . LUMBAR LAMINECTOMY/DECOMPRESSION MICRODISCECTOMY Right 04/20/2020   Procedure: Laminectomy and Foraminotomy - right - Lumbar five-Sacral one;  Surgeon: [name], MD;  Location: [facility];  Service: Neurosurgery;  Laterality: Right;  . LUMBAR LAMINECTOMY/DECOMPRESSION MICRODISCECTOMY Right 06/08/2020   Procedure: Right Lumbar Two-Three Laminectomy and Foraminotomy;  Surgeon: [name], MD;  Location: [facility];  Service: Neurosurgery;  Laterality: Right;  . submucous resection nose     1956    Family History  Problem Relation Age of Onset  . Colon cancer Neg Hx   . Migraines Neg Hx    Social History:  reports that he has never smoked. He has never used smokeless tobacco. He reports current alcohol use. He reports that he does not use drugs.  Allergies:  Allergies  Allergen Reactions  . Sulfa Antibiotics  Hives    Medications Prior to Admission  Medication Sig Dispense Refill  . aspirin 81 MG tablet Take 81 mg by mouth daily.    Marland Kitchen docusate sodium (COLACE) 100 MG capsule Take 100 mg by mouth 3 (three) times daily.    . DULoxetine (CYMBALTA) 20 MG capsule Take 20 mg by mouth 2 (two) times daily.    Marland Kitchen ezetimibe (ZETIA) 10 MG tablet Take 10 mg by mouth at bedtime.     Marland Kitchen HYDROcodone-acetaminophen (NORCO/VICODIN) 5-325 MG tablet Take 1 tablet by mouth every 6 (six) hours as needed for moderate pain.     . pregabalin (LYRICA) 75 MG capsule Take 75 mg by mouth 2 (two) times daily.    . rosuvastatin (CRESTOR) 20 MG tablet Take 20 mg by mouth at bedtime.       Results for orders placed or performed during the hospital encounter of 09/28/20 (from the past 48 hour(s))  CBC WITH DIFFERENTIAL     Status: None   Collection Time: 09/28/20 11:02 AM  Result Value Ref Range   WBC 6.2 4.0 - 10.5 K/uL   RBC 4.61 4.22 - 5.81 MIL/uL   Hemoglobin 13.7 13.0 - 17.0 g/dL   HCT 42.7 39 - 52 %   MCV 92.6 80.0 - 100.0 fL   MCH 29.7 26.0 - 34.0 pg   MCHC 32.1 30.0 - 36.0 g/dL   RDW 11.9 11.5 - 15.5 %   Platelets 183 150 - 400 K/uL   nRBC 0.0 0.0 - 0.2 %  Neutrophils Relative % 52 %   Neutro Abs 3.2 1.7 - 7.7 K/uL   Lymphocytes Relative 32 %   Lymphs Abs 2.0 0.7 - 4.0 K/uL   Monocytes Relative 12 %   Monocytes Absolute 0.8 0.1 - 1.0 K/uL   Eosinophils Relative 3 %   Eosinophils Absolute 0.2 0.0 - 0.5 K/uL   Basophils Relative 1 %   Basophils Absolute 0.1 0.0 - 0.1 K/uL   Immature Granulocytes 0 %   Abs Immature Granulocytes 0.02 0.00 - 0.07 K/uL    Comment: Performed at [facility] [address] [place], [place] [address]  Basic metabolic panel     Status: Abnormal   Collection Time: 09/28/20 11:02 AM  Result Value Ref Range   Sodium 138 135 - 145 mmol/L   Potassium 4.1 3.5 - 5.1 mmol/L   Chloride 104 98 - 111 mmol/L   CO2 25 22 - 32 mmol/L   Glucose, Bld 132 (H) 70 - 99 mg/dL    Comment:  Glucose reference range applies only to samples taken after fasting for at least 8 hours.   BUN 22 8 - 23 mg/dL   Creatinine, Ser 1.44 (H) 0.61 - 1.24 mg/dL   Calcium 8.9 8.9 - 10.3 mg/dL   GFR, Estimated 48 (L) >60 mL/min    Comment: (NOTE) Calculated using the CKD-EPI Creatinine Equation (2021)    Anion gap 9 5 - 15    Comment: Performed at [facility] [address] [place], [place] [address]  SARS Coronavirus 2 by RT PCR (hospital order, performed in [facility] hospital lab) Nasopharyngeal Nasopharyngeal Swab     Status: None   Collection Time: 09/28/20 11:07 AM   Specimen: Nasopharyngeal Swab  Result Value Ref Range   SARS Coronavirus 2 NEGATIVE NEGATIVE    Comment: (NOTE) SARS-CoV-2 target nucleic acids are NOT DETECTED.  The SARS-CoV-2 RNA is generally detectable in upper and lower respiratory specimens during the acute phase of infection. The lowest concentration of SARS-CoV-2 viral copies this assay can detect is 250 copies / mL. A negative result does not preclude SARS-CoV-2 infection and should not be used as the sole basis for treatment or other patient management decisions.  A negative result may occur with improper specimen collection / handling, submission of specimen other than nasopharyngeal swab, presence of viral mutation(s) within the areas targeted by this assay, and inadequate number of viral copies (<250 copies / mL). A negative result must be combined with clinical observations, patient history, and epidemiological information.  Fact Sheet for Patients:   StrictlyIdeas.no  Fact Sheet for Healthcare Providers: BankingDealers.co.za  This test is not yet approved or  cleared by the [place] FDA and has been authorized for detection and/or diagnosis of SARS-CoV-2 by FDA under an Emergency Use Authorization (EUA).  This EUA will remain in effect (meaning this test can be used) for the duration of  the COVID-19 declaration under Section 564(b)(1) of the Act, 21 U.S.C. section 360bbb-3(b)(1), unless the authorization is terminated or revoked sooner.  Performed at [facility], [place] [address] [place], [place] [address]   Type and screen     Status: None   Collection Time: 09/28/20 11:40 AM  Result Value Ref Range   ABO/RH(D) A POS    Antibody Screen NEG    Sample Expiration      10/01/2020,2359 Performed at [facility], [place] [address], [place], [place] [address]   ABO/Rh  Status: None   Collection Time: 09/28/20  1:19 PM  Result Value Ref Range   ABO/RH(D)      A POS Performed at [facility] [address], [place], [place] [address]    No results found.  Pertinent items noted in HPI and remainder of comprehensive ROS otherwise negative.  Blood pressure (!) 158/81, pulse 61, temperature 97.7 F (36.5 C), temperature source Oral, resp. rate 20, height 5\' 7"  (1.702 m), weight 70.3 kg, SpO2 98 %.  Patient is awake and alert.  He is oriented and appropriate.  He is obviously quite uncomfortable examination is cranial nerves are intact bilaterally.  Speech is fluent.  Judgment insight are intact.  Motor examination intact bilaterally sensory examination with some decrease sensation pinprick light touch in his right L5 dermatome.  Deep tender versus hypoactive but symmetric.  No evidence of long track signs.  Gait antalgic.  Posture reasonably normal peer examination head ears eyes nose throat is unremarked.  Chest and abdomen are benign.  Extremities are free of major deformity. Assessment/Plan Right L5-S1 severe foraminal stenosis with ongoing right L5 radiculopathy.  Plan bilateral L5-S1 decompressive laminotomies and foraminotomies followed by posterior lumbar interbody fusion utilizing interbody cages and locally harvested autograft coupled with posterior arthrodesis utilizing nonsegmental pedicle screw fixation and local autografting.  Risks and benefits  been explained.  Patient wishes to proceed.  Mallie Mussel A Torrin Crihfield 09/28/2020, 2:24 PM

## 2020-09-28 NOTE — Op Note (Signed)
Date of procedure: 09/28/2020  Date of dictation: Same  Service: Neurosurgery  Preoperative diagnosis: Severe right-sided L5-S1 foraminal stenosis with radiculopathy  Postoperative diagnosis: Same  Procedure Name: Bilateral L5-S1 decompressive laminotomies with foraminotomies, more than would be required for simple interbody fusion alone.  L5-S1 posterior lumbar interbody fusion utilizing interbody cages and local autograft.  L5-S1 posterior letter arthrodesis utilizing nonsegmental instrumentation with local autograft  Surgeon:Dennis Carey, M.D.  Asst. Surgeon: None  Anesthesia: General  Indication: 85 year old male with severe right lower extremity L5 radicular pain failing all efforts at conservative management.  Patient with severe foraminal stenosis on the right at L5-S1.  He is status post previous right-sided L5S1 foraminotomies.  He presents now for decompressive surgery in hopes of improving his symptoms.  Operative note: After induction of anesthesia, patient position prone on the Wilson frame and properly padded.  Patient's lumbar region prepped and draped sterilely.  Incision made following L5-S1.  Dissection performed bilaterally.  Retractor placed.  Fluoroscopy used.  Levels confirmed.  Decompressive laminotomies and facetectomies performed bilaterally using Leksell rongeurs Kerrison rongeurs and the high-speed drill to remove the inferior two thirds of the lamina of L5, the inferior facets and pars interarticularis of L5 bilaterally, and the majority the superior facets of S1 bilaterally.  Ligament flavum and epidural scar were also resected.  Aggressive foraminotomies were performed on the course exiting L5 nerve roots bilaterally.  S1 nerve roots were also decompressed.  Epidural venous plexus was coagulated and cut.  Thecal sac was protected.  The space was then incised.  Discectomy was performed bilaterally.  Dissipates then entered and cleaned of all soft tissue.  A  distractor was placed patient's right side.  With the dissipates distracted the disc base was prepared for interbody fusion.  A 9 mm Medtronic interbody expandable cage was then impacted into place and expanded.  Distractor removed patient's right side.  To space prepared on the right side.  Morselized autograft was packed into the interspace.  A second cage was impacted in the place and expanded.  Pedicles of L5 and S1 were identified using surface landmarks and intraoperative fluoroscopy with superficial bone around the pedicles and removed using high-speed drill each pedicle was then probed using a pedicle all each pedicle awl track was probed and found to be solid within the bone.  Each pedicle tract was then tapped with a screw tap.  5.75 mm radius brand screws from [employer] were then placed bilaterally at L5 and on the left side at S1.  Attempts at placing S1 pedicle screws on the right more not successful.  The pedicle itself stripped and was not usable.  I then placed a right-sided S1 alar screw with good fixation.  Final images reveal good position of the cages and the hardware at the proper operative level with normal alignment of spine.  Wound was then irrigated with antibiotic solution.  Graft done DBX putty was then placed inside of each cage.  Gelfoam was placed over the laminotomy defects.  The transverse processes and sacral ala were decorticated.  Morselized autograft was packed posterior laterally.  Short segment titanium rod placed over the screw heads at L5 and S1.  Locking caps placed over the screws.  Locking caps then engaged with a construct under compression.  Vancomycin powder was placed in deep wound space.  Wounds are closed in layers with Vicryl sutures.  Steri-Strips and sterile dressing were applied.  No apparent complications.  Patient tolerated the procedure well and he  returned to the recovery room postop.

## 2020-09-28 NOTE — Brief Op Note (Signed)
09/28/2020  6:25 PM  PATIENT:  Dennis Carey  85 y.o. male  PRE-OPERATIVE DIAGNOSIS:  Stenosis  POST-OPERATIVE DIAGNOSIS:  Stenosis  PROCEDURE:  Procedure(s): LUMBAR FIVE-SACRAL ONE POSTERIOR LUMBAR INTERBODY FUSION (N/A)  SURGEON:  Surgeon(s) and Role:    * [name], MD - Primary  PHYSICIAN ASSISTANT:   ASSISTANTS:    ANESTHESIA:   general  EBL:  250 mL   BLOOD ADMINISTERED:none  DRAINS: none   LOCAL MEDICATIONS USED:  MARCAINE     SPECIMEN:  No Specimen  DISPOSITION OF SPECIMEN:  N/A  COUNTS:  YES  TOURNIQUET:  * No tourniquets in log *  DICTATION: .Dragon Dictation  PLAN OF CARE: Admit for overnight observation  PATIENT DISPOSITION:  PACU - hemodynamically stable.   Delay start of Pharmacological VTE agent (>24hrs) due to surgical blood loss or risk of bleeding: yes

## 2020-09-28 NOTE — Transfer of Care (Signed)
Immediate Anesthesia Transfer of Care Note  Patient: Dennis Carey  Procedure(s) Performed: LUMBAR FIVE-SACRAL ONE POSTERIOR LUMBAR INTERBODY FUSION (N/A Spine Lumbar)  Patient Location: PACU  Anesthesia Type:General  Level of Consciousness: drowsy and patient cooperative  Airway & Oxygen Therapy: Patient Spontanous Breathing and Patient connected to face mask oxygen  Post-op Assessment: Report given to RN and Post -op Vital signs reviewed and stable  Post vital signs: Reviewed and stable  Last Vitals:  Vitals Value Taken Time  BP 114/66 09/28/20 1846  Temp    Pulse 88 09/28/20 1850  Resp 21 09/28/20 1850  SpO2 100 % 09/28/20 1850  Vitals shown include unvalidated device data.  Last Pain:  Vitals:   09/28/20 1352  TempSrc:   PainSc: 6       Patients Stated Pain Goal: 3 (86/57/84 6962)  Complications: No complications documented.

## 2020-09-29 DIAGNOSIS — I2510 Atherosclerotic heart disease of native coronary artery without angina pectoris: Secondary | ICD-10-CM | POA: Diagnosis not present

## 2020-09-29 DIAGNOSIS — Z20822 Contact with and (suspected) exposure to covid-19: Secondary | ICD-10-CM | POA: Diagnosis not present

## 2020-09-29 DIAGNOSIS — N183 Chronic kidney disease, stage 3 unspecified: Secondary | ICD-10-CM | POA: Diagnosis not present

## 2020-09-29 DIAGNOSIS — M48061 Spinal stenosis, lumbar region without neurogenic claudication: Secondary | ICD-10-CM | POA: Diagnosis not present

## 2020-09-29 DIAGNOSIS — I129 Hypertensive chronic kidney disease with stage 1 through stage 4 chronic kidney disease, or unspecified chronic kidney disease: Secondary | ICD-10-CM | POA: Diagnosis not present

## 2020-09-29 DIAGNOSIS — Z79899 Other long term (current) drug therapy: Secondary | ICD-10-CM | POA: Diagnosis not present

## 2020-09-29 MED ORDER — DIAZEPAM 5 MG PO TABS: 5.0000 mg | ORAL_TABLET | Freq: Four times a day (QID) | ORAL | 0 refills | Status: AC | PRN

## 2020-09-29 MED ORDER — OXYCODONE HCL 10 MG PO TABS: 10.0000 mg | ORAL_TABLET | ORAL | 0 refills | Status: DC | PRN

## 2020-09-29 NOTE — Evaluation (Signed)
Occupational Therapy Evaluation Patient Details Name: Dennis Carey MRN: [id number] DOB: 11-14-1934 Today's Date: 09/29/2020    History of Present Illness The pt is an 85 yo male presenting s/p PLIF L5-S1 on 11/19 due to ongoing RLE pain. The pt has had multiple prior lumbar surgeries in June and July of this year. Other PMH includes; CAD, CKD III, HTN, and HLD.    Clinical Impression   Pt PTA: Pt living at home with spouse and reports independence with ADL and mobility with SPC occasionally. Pt currently minA overall for LB ADL and AE education. Pt has supportive spouse at home. Pt with tub shower and requires additional training for transfer as pt unable to hip hike fully above tub transfer. Pt would benefit from continued OT skilled services for ADL, mobility and self care. OT following acutely.     Follow Up Recommendations  No OT follow up;Supervision - Intermittent    Equipment Recommendations  3 in 1 bedside commode    Recommendations for Other Services       Precautions / Restrictions Precautions Precautions: Back;Fall Precaution Booklet Issued: Yes (comment) Precaution Comments: Verbal discussion of back precautions; pt able to state 2/3, requiring assist for "no lifting" Required Braces or Orthoses: Spinal Brace Spinal Brace: Lumbar corset;Applied in sitting position Restrictions Weight Bearing Restrictions: No      Mobility Bed Mobility Overal bed mobility: Needs Assistance Bed Mobility: Rolling;Sidelying to Sit Rolling: Supervision Sidelying to sit: Supervision       General bed mobility comments: log roll; use of rail to R side    Transfers Overall transfer level: Needs assistance Equipment used: 1 person hand held assist Transfers: Sit to/from Stand;Stand Pivot Transfers Sit to Stand: Min guard Stand pivot transfers: Min guard       General transfer comment: no AD used, but pt requires 1 HHA for balance and AD for longer distances    Balance  Overall balance assessment: Needs assistance Sitting-balance support: Bilateral upper extremity supported Sitting balance-Leahy Scale: Good     Standing balance support: Single extremity supported;During functional activity Standing balance-Leahy Scale: Poor Standing balance comment: requiring HHA or AD for balance                           ADL either performed or assessed with clinical judgement   ADL Overall ADL's : Needs assistance/impaired Eating/Feeding: Modified independent   Grooming: Supervision/safety;Standing   Upper Body Bathing: Set up;Standing   Lower Body Bathing: Minimal assistance;Sitting/lateral leans;Sit to/from stand;With adaptive equipment Lower Body Bathing Details (indicate cue type and reason): long handled sponge simulation and education Upper Body Dressing : Set up;Sitting   Lower Body Dressing: Minimal assistance;Cueing for safety;With adaptive equipment;Sitting/lateral leans;Sit to/from stand Lower Body Dressing Details (indicate cue type and reason): reacher for donning boxers; able to adhere to back precautions this way Toilet Transfer: Min guard;Ambulation   Toileting- Clothing Manipulation and Hygiene: Supervision/safety;Sitting/lateral lean;Sit to/from stand       Functional mobility during ADLs: Min guard;Cueing for safety (hand held assist) General ADL Comments: Pt limited by decreased activity tolerance and decreased strength. Pt requiring HHA for ADL functional mobility at home. Pt education provided for ADL AE. Pt simulating tub transfer and brushing teeth at sink.     Vision Baseline Vision/History: Wears glasses Wears Glasses: At all times Patient Visual Report: No change from baseline Vision Assessment?: No apparent visual deficits     Perception     Praxis  Pertinent Vitals/Pain Pain Assessment: 0-10 Pain Score: 2  Pain Location: B/L feet Pain Descriptors / Indicators: Discomfort Pain Intervention(s): Monitored  during session     Hand Dominance Right   Extremity/Trunk Assessment Upper Extremity Assessment Upper Extremity Assessment: Overall WFL for tasks assessed   Lower Extremity Assessment Lower Extremity Assessment: Generalized weakness;Defer to PT evaluation   Cervical / Trunk Assessment Cervical / Trunk Assessment: Other exceptions Cervical / Trunk Exceptions: lumbar/sacral sx   Communication Communication Communication: No difficulties   Cognition Arousal/Alertness: Awake/alert Behavior During Therapy: WFL for tasks assessed/performed Overall Cognitive Status: Within Functional Limits for tasks assessed                                     General Comments  Talked to spouse in hallway as she arrived after OTR visit regarding AE.    Exercises     Shoulder Instructions      Home Living Family/patient expects to be discharged to:: Private residence Living Arrangements: Spouse/significant other Available Help at Discharge: Family Type of Home: House Home Access: Stairs to enter [employer] of Steps: 8 Entrance Stairs-Rails: Right Home Layout: Two level;Able to live on main level with bedroom/bathroom     Bathroom Shower/Tub: Teacher, early years/pre: Standard     Home Equipment: Cane - single point;Grab bars - tub/shower;Walker - 2 wheels          Prior Functioning/Environment Level of Independence: Independent with assistive device(s)        Comments: Occasionally using SPC        OT Problem List: Decreased strength;Decreased activity tolerance;Impaired balance (sitting and/or standing);Pain;Decreased knowledge of use of DME or AE;Decreased knowledge of precautions      OT Treatment/Interventions: Self-care/ADL training;Therapeutic exercise;Energy conservation;DME and/or AE instruction;Therapeutic activities;Patient/family education;Balance training    OT Goals(Current goals can be found in the care plan section) Acute  Rehab OT Goals Patient Stated Goal: to go home OT Goal Formulation: With patient Time For Goal Achievement: 10/13/20 Potential to Achieve Goals: Good ADL Goals Pt Will Perform Lower Body Dressing: with set-up;sitting/lateral leans;sit to/from stand;with adaptive equipment Pt Will Perform Tub/Shower Transfer: Tub transfer;with supervision;3 in 1  OT Frequency: Min 2X/week   Barriers to D/C:            Co-evaluation              AM-PAC OT "6 Clicks" Daily Activity     Outcome Measure Help from another person eating meals?: None Help from another person taking care of personal grooming?: A Little Help from another person toileting, which includes using toliet, bedpan, or urinal?: A Little Help from another person bathing (including washing, rinsing, drying)?: A Little Help from another person to put on and taking off regular upper body clothing?: A Little Help from another person to put on and taking off regular lower body clothing?: A Little 6 Click Score: 19   End of Session Equipment Utilized During Treatment: Back brace Nurse Communication: Mobility status;Precautions  Activity Tolerance: Patient tolerated treatment well Patient left: in chair;with call bell/phone within reach  OT Visit Diagnosis: Unsteadiness on feet (R26.81);Muscle weakness (generalized) (M62.81);Pain Pain - part of body: Ankle and joints of foot (B/L)                Time: 8676-1950 OT Time Calculation (min): 30 min Charges:  OT General Charges $OT Visit: 1 Visit OT Evaluation $OT  Eval Moderate Complexity: 1 Mod OT Treatments $Self Care/Home Management : 8-22 mins  Jefferey Pica, OTR/L Acute Rehabilitation Services Pager: [phone] Office: [phone]   Azriel Jakob C 09/29/2020, 8:30 AM

## 2020-09-29 NOTE — Evaluation (Signed)
Physical Therapy Evaluation Patient Details Name: Dennis Carey MRN: [id number] DOB: 06/23/1935 Today's Date: 09/29/2020   History of Present Illness  The pt is an 85 yo male presenting s/p PLIF L5-S1 on 11/19 due to ongoing RLE pain. The pt has had multiple prior lumbar surgeries in June and July of this year. Other PMH includes; CAD, CKD III, HTN, and HLD.   Clinical Impression  Pt in bed upon arrival of PT, agreeable to evaluation at this time. Prior to admission the pt was walking independently, participating in PT for RLE strengthening. The pt now presents with limitations in functional mobility, stability, and power due to above dx, and will continue to benefit from skilled PT to address these deficits. The pt was able to demo good strength in his RLE with MMT, and was able to use functionally with gait and stairs without assist. He was able to power up to stand and walk with use of SPC for stability and minG for safety. The pt does ambulate with slight lateral sway and instability that increases with fatigue, but was educated on use of AD and energy conservation techniques for safe mobilization at home. The pt and his wife expressed good understanding of all education and have no further questions or concerns relating to mobility at this time. The pt will continue to benefit from skilled PT to progress functional strength in BLE, static and dynamic stability without UE support, and endurance to facilitate safe return to prior level of mobility and activity.      Follow Up Recommendations Outpatient PT;Supervision for mobility/OOB    Equipment Recommendations  None recommended by PT (none needed)    Recommendations for Other Services       Precautions / Restrictions Precautions Precautions: Back;Fall Precaution Booklet Issued: Yes (comment) Precaution Comments: Verbal discussion of back precautions; pt able to state 2/3, requiring assist for "no lifting" Required Braces or Orthoses:  Spinal Brace Spinal Brace: Lumbar corset;Applied in sitting position Restrictions Weight Bearing Restrictions: No      Mobility  Bed Mobility Overal bed mobility: Needs Assistance Bed Mobility: Rolling;Sidelying to Sit Rolling: Supervision Sidelying to sit: Supervision       General bed mobility comments: pt up in recliner    Transfers Overall transfer level: Needs assistance Equipment used: Straight cane Transfers: Sit to/from Stand Sit to Stand: Min guard Stand pivot transfers: Min guard       General transfer comment: minG for safety with use of SPC  Ambulation/Gait Ambulation/Gait assistance: Min guard Gait Distance (Feet): 250 Feet Assistive device: Straight cane Gait Pattern/deviations: Step-through pattern;Decreased stride length;Trunk flexed Gait velocity: 0.36 m/s Gait velocity interpretation: <1.31 ft/sec, indicative of household ambulator General Gait Details: frequent cues for posture, pt with slight lateral instability  Stairs Stairs: Yes Stairs assistance: Min guard Stair Management: One rail Left;With cane;Forwards;Step to pattern Number of Stairs: 4 (x2, able to complete with RLE or LLE leading equally)        Balance Overall balance assessment: Needs assistance Sitting-balance support: Feet supported;No upper extremity supported Sitting balance-Leahy Scale: Good     Standing balance support: Single extremity supported;During functional activity Standing balance-Leahy Scale: Poor Standing balance comment: requiring HHA or AD for balance                             Pertinent Vitals/Pain Pain Assessment: 0-10 Pain Score: 2  Pain Location: B/L feet Pain Descriptors / Indicators: Discomfort Pain Intervention(s): Monitored  during session    [place] expects to be discharged to:: Private residence Living Arrangements: Spouse/significant other Available Help at Discharge: Family Type of Home: House Home  Access: Stairs to enter Entrance Stairs-Rails: Right Entrance Stairs-Number of Steps: Bath: Two level;Able to live on main level with bedroom/bathroom Home Equipment: Gilford Rile - 2 wheels;Cane - single point;Grab bars - tub/shower Additional Comments: Pt wife confirmed home set-up and support    Prior Function Level of Independence: Independent with assistive device(s)         Comments: Occasionally using SPC     Hand Dominance   Dominant Hand: Right    Extremity/Trunk Assessment   Upper Extremity Assessment Upper Extremity Assessment: Overall WFL for tasks assessed    Lower Extremity Assessment Lower Extremity Assessment: Overall WFL for tasks assessed;RLE deficits/detail RLE Deficits / Details: slight decrease in ankle DF (4-/5) but able ot use functionally, denies difference in sesnation other than between great toe and second toe on R foot    Cervical / Trunk Assessment Cervical / Trunk Assessment: Other exceptions Cervical / Trunk Exceptions: lumbar/sacral sx  Communication   Communication: No difficulties  Cognition Arousal/Alertness: Awake/alert Behavior During Therapy: WFL for tasks assessed/performed Overall Cognitive Status: Within Functional Limits for tasks assessed                                        General Comments General comments (skin integrity, edema, etc.): Discussed return to OPPPT    Exercises     Assessment/Plan    PT Assessment Patient needs continued PT services  PT Problem List Decreased strength;Decreased balance;Decreased mobility       PT Treatment Interventions DME instruction;Gait training;Stair training;Functional mobility training;Therapeutic activities;Therapeutic exercise;Balance training;Patient/family education    PT Goals (Current goals can be found in the Care Plan section)  Acute Rehab PT Goals Patient Stated Goal: to go home PT Goal Formulation: With patient/family Time For Goal Achievement:  10/13/20 Potential to Achieve Goals: Good    Frequency Min 5X/week    AM-PAC PT "6 Clicks" Mobility  Outcome Measure Help needed turning from your back to your side while in a flat bed without using bedrails?: A Little Help needed moving from lying on your back to sitting on the side of a flat bed without using bedrails?: A Little Help needed moving to and from a bed to a chair (including a wheelchair)?: A Little Help needed standing up from a chair using your arms (e.g., wheelchair or bedside chair)?: A Little Help needed to walk in hospital room?: A Little Help needed climbing 3-5 steps with a railing? : A Little 6 Click Score: 18    End of Session Equipment Utilized During Treatment: Gait belt;Back brace Activity Tolerance: Patient tolerated treatment well Patient left: in chair;with call bell/phone within reach;with family/visitor present Nurse Communication: Mobility status PT Visit Diagnosis: Other abnormalities of gait and mobility (R26.89);Muscle weakness (generalized) (M62.81);Unsteadiness on feet (R26.81)    Time: 4742-5956 PT Time Calculation (min) (ACUTE ONLY): 31 min   Charges:   PT Evaluation $PT Eval Moderate Complexity: 1 Mod PT Treatments $Gait Training: 8-22 mins        Karma Ganja, PT, DPT   Acute Rehabilitation Department Pager #: [phone]  Otho Bellows 09/29/2020, 9:45 AM

## 2020-09-29 NOTE — Anesthesia Postprocedure Evaluation (Signed)
Anesthesia Post Note  Patient: Dennis Carey  Procedure(s) Performed: LUMBAR FIVE-SACRAL ONE POSTERIOR LUMBAR INTERBODY FUSION (N/A Spine Lumbar)     Patient location during evaluation: PACU Anesthesia Type: General Level of consciousness: awake and alert Pain management: pain level controlled Vital Signs Assessment: post-procedure vital signs reviewed and stable Respiratory status: spontaneous breathing, nonlabored ventilation, respiratory function stable and patient connected to nasal cannula oxygen Cardiovascular status: blood pressure returned to baseline and stable Postop Assessment: no apparent nausea or vomiting Anesthetic complications: no   No complications documented.  Last Vitals:  Vitals:   09/29/20 0417 09/29/20 0728  BP: 119/61 (!) 101/55  Pulse: 87 80  Resp: 18 16  Temp: 36.4 C 36.6 C  SpO2: 96% 97%    Last Pain:  Vitals:   09/29/20 0728  TempSrc: Oral  PainSc:                  [name]

## 2020-09-29 NOTE — Progress Notes (Signed)
Orthopedic Tech Progress Note Patient Details:  Dennis Carey 08-15-1935 [id number]  Patient ID: Dennis Carey, male   DOB: 1935/02/13, 85 y.o.   MRN: [id number] Called order into [facility]  Karolee Stamps 09/29/2020, 6:20 AM

## 2020-09-29 NOTE — Plan of Care (Signed)
Patient alert and oriented, mae's well, voiding adequate amount of urine, swallowing without difficulty, c/o discomfort and soreness at time of discharge and medication given prior to discharge. Patient discharged home with family. Script and discharged instructions given to patient. Patient and family stated understanding of instructions given. Patient has an appointment with Dr. Annette Stable

## 2020-09-29 NOTE — Discharge Summary (Signed)
Physician Discharge Summary  Patient ID: Dennis Carey MRN: [id number] DOB/AGE: 1935-08-22 85 y.o.  Admit date: 09/28/2020 Discharge date: 09/29/2020  Admission Diagnoses:  Discharge Diagnoses:  Active Problems:   Lumbar foraminal stenosis   Discharged Condition: good  Hospital Course: Patient mated to the hospital where he underwent uncomplicated W0-J8 decompression and fusion.  Postoperatively doing very well.  His preoperative severe radicular pain is much improved.  He is standing and walking without difficulty.  He feels comfortable with discharge home.  He is voiding well.  Consults:   Significant Diagnostic Studies:   Treatments:   Discharge Exam: Blood pressure (!) 101/55, pulse 80, temperature 97.9 F (36.6 C), temperature source Oral, resp. rate 16, height 5\' 7"  (1.702 m), weight 70.3 kg, SpO2 97 %. Awake and alert.  Oriented and appropriate.  Motor and sensory function intact.  Wound clean and dry.  Chest and abdomen benign.  Disposition: Discharge disposition: 01-Home or Self Care        Allergies as of 09/29/2020      Reactions   Sulfa Antibiotics Hives      Medication List    STOP taking these medications   DULoxetine 20 MG capsule Commonly known as: CYMBALTA     TAKE these medications   aspirin 81 MG tablet Take 81 mg by mouth daily.   diazepam 5 MG tablet Commonly known as: VALIUM Take 1 tablet (5 mg total) by mouth every 6 (six) hours as needed for muscle spasms.   docusate sodium 100 MG capsule Commonly known as: COLACE Take 100 mg by mouth 3 (three) times daily.   ezetimibe 10 MG tablet Commonly known as: ZETIA Take 10 mg by mouth at bedtime.   HYDROcodone-acetaminophen 5-325 MG tablet Commonly known as: NORCO/VICODIN Take 1 tablet by mouth every 6 (six) hours as needed for moderate pain.   Oxycodone HCl 10 MG Tabs Take 1 tablet (10 mg total) by mouth every 3 (three) hours as needed for severe pain ((score 7 to 10)).    pregabalin 75 MG capsule Commonly known as: LYRICA Take 75 mg by mouth 2 (two) times daily.   rosuvastatin 20 MG tablet Commonly known as: CRESTOR Take 20 mg by mouth at bedtime.            Durable Medical Equipment  (From admission, onward)         Start     Ordered   09/28/20 2039  DME Walker rolling  Once       Question:  Patient needs a walker to treat with the following condition  Answer:  Lumbar foraminal stenosis   09/28/20 2038   09/28/20 2039  DME 3 n 1  Once        09/28/20 2038          Follow-up Information    [name], MD Follow up.   Specialty: Neurosurgery Contact information: [address] [place] [address] [phone]               Signed: Charlie Pitter 09/29/2020, 9:39 AM

## 2020-09-29 NOTE — Discharge Instructions (Signed)
Wound Care Keep incision covered and dry for two days.   Do not put any creams, lotions, or ointments on incision. Leave steri-strips on back.  They will fall off by themselves. Activity Walk each and every day, increasing distance each day. No lifting greater than 5 lbs.  Avoid excessive neck motion. No driving for 2 weeks; may ride as a passenger locally. If provided with back brace, wear when out of bed.  It is not necessary to wear brace in bed. Diet Resume your normal diet.  Return to Work Will be discussed at you follow up appointment. Call Your Doctor If Any of These Occur Redness, drainage, or swelling at the wound.  Temperature greater than 101 degrees. Severe pain not relieved by pain medication. Incision starts to come apart. Follow Up Appt Call  [phone])  for problems.  If you have any hardware placed in your spine, you will need an x-ray before your appointment.  

## 2020-10-01 MED FILL — Heparin Sodium (Porcine) Inj 1000 Unit/ML: INTRAMUSCULAR | Qty: 30 | Status: AC

## 2020-10-01 MED FILL — Sodium Chloride IV Soln 0.9%: INTRAVENOUS | Qty: 1000 | Status: AC

## 2020-10-10 ENCOUNTER — Inpatient Hospital Stay (HOSPITAL_COMMUNITY): Payer: Medicare Other

## 2020-10-10 ENCOUNTER — Inpatient Hospital Stay (HOSPITAL_COMMUNITY)
Admission: AD | Admit: 2020-10-10 | Discharge: 2020-11-10 | DRG: 947 | Disposition: E | Payer: Medicare Other | Attending: Neurosurgery | Admitting: Neurosurgery

## 2020-10-10 DIAGNOSIS — J810 Acute pulmonary edema: Secondary | ICD-10-CM | POA: Diagnosis not present

## 2020-10-10 DIAGNOSIS — N170 Acute kidney failure with tubular necrosis: Secondary | ICD-10-CM | POA: Diagnosis not present

## 2020-10-10 DIAGNOSIS — R7401 Elevation of levels of liver transaminase levels: Secondary | ICD-10-CM | POA: Diagnosis not present

## 2020-10-10 DIAGNOSIS — R140 Abdominal distension (gaseous): Secondary | ICD-10-CM

## 2020-10-10 DIAGNOSIS — J188 Other pneumonia, unspecified organism: Secondary | ICD-10-CM

## 2020-10-10 DIAGNOSIS — G8918 Other acute postprocedural pain: Principal | ICD-10-CM | POA: Diagnosis present

## 2020-10-10 DIAGNOSIS — Z9289 Personal history of other medical treatment: Secondary | ICD-10-CM

## 2020-10-10 DIAGNOSIS — Z981 Arthrodesis status: Secondary | ICD-10-CM | POA: Diagnosis not present

## 2020-10-10 DIAGNOSIS — T17500A Unspecified foreign body in bronchus causing asphyxiation, initial encounter: Secondary | ICD-10-CM

## 2020-10-10 DIAGNOSIS — E11649 Type 2 diabetes mellitus with hypoglycemia without coma: Secondary | ICD-10-CM | POA: Diagnosis not present

## 2020-10-10 DIAGNOSIS — R579 Shock, unspecified: Secondary | ICD-10-CM | POA: Diagnosis not present

## 2020-10-10 DIAGNOSIS — J690 Pneumonitis due to inhalation of food and vomit: Secondary | ICD-10-CM | POA: Diagnosis not present

## 2020-10-10 DIAGNOSIS — Z882 Allergy status to sulfonamides status: Secondary | ICD-10-CM

## 2020-10-10 DIAGNOSIS — Z978 Presence of other specified devices: Secondary | ICD-10-CM

## 2020-10-10 DIAGNOSIS — Z20822 Contact with and (suspected) exposure to covid-19: Secondary | ICD-10-CM | POA: Diagnosis present

## 2020-10-10 DIAGNOSIS — Z515 Encounter for palliative care: Secondary | ICD-10-CM | POA: Diagnosis not present

## 2020-10-10 DIAGNOSIS — J9690 Respiratory failure, unspecified, unspecified whether with hypoxia or hypercapnia: Secondary | ICD-10-CM

## 2020-10-10 DIAGNOSIS — D6489 Other specified anemias: Secondary | ICD-10-CM | POA: Diagnosis present

## 2020-10-10 DIAGNOSIS — Y95 Nosocomial condition: Secondary | ICD-10-CM | POA: Diagnosis not present

## 2020-10-10 DIAGNOSIS — D649 Anemia, unspecified: Secondary | ICD-10-CM | POA: Diagnosis not present

## 2020-10-10 DIAGNOSIS — Z79899 Other long term (current) drug therapy: Secondary | ICD-10-CM

## 2020-10-10 DIAGNOSIS — R627 Adult failure to thrive: Secondary | ICD-10-CM | POA: Diagnosis present

## 2020-10-10 DIAGNOSIS — M48061 Spinal stenosis, lumbar region without neurogenic claudication: Secondary | ICD-10-CM | POA: Diagnosis not present

## 2020-10-10 DIAGNOSIS — J90 Pleural effusion, not elsewhere classified: Secondary | ICD-10-CM | POA: Diagnosis not present

## 2020-10-10 DIAGNOSIS — A419 Sepsis, unspecified organism: Secondary | ICD-10-CM | POA: Diagnosis not present

## 2020-10-10 DIAGNOSIS — R0902 Hypoxemia: Secondary | ICD-10-CM | POA: Diagnosis not present

## 2020-10-10 DIAGNOSIS — N183 Chronic kidney disease, stage 3 unspecified: Secondary | ICD-10-CM | POA: Diagnosis not present

## 2020-10-10 DIAGNOSIS — J811 Chronic pulmonary edema: Secondary | ICD-10-CM | POA: Diagnosis not present

## 2020-10-10 DIAGNOSIS — J9811 Atelectasis: Secondary | ICD-10-CM | POA: Diagnosis present

## 2020-10-10 DIAGNOSIS — Z7982 Long term (current) use of aspirin: Secondary | ICD-10-CM

## 2020-10-10 DIAGNOSIS — Z66 Do not resuscitate: Secondary | ICD-10-CM | POA: Diagnosis not present

## 2020-10-10 DIAGNOSIS — E875 Hyperkalemia: Secondary | ICD-10-CM | POA: Diagnosis not present

## 2020-10-10 DIAGNOSIS — D72829 Elevated white blood cell count, unspecified: Secondary | ICD-10-CM

## 2020-10-10 DIAGNOSIS — G8929 Other chronic pain: Secondary | ICD-10-CM | POA: Diagnosis present

## 2020-10-10 DIAGNOSIS — Z4682 Encounter for fitting and adjustment of non-vascular catheter: Secondary | ICD-10-CM | POA: Diagnosis not present

## 2020-10-10 DIAGNOSIS — R609 Edema, unspecified: Secondary | ICD-10-CM | POA: Diagnosis not present

## 2020-10-10 DIAGNOSIS — I471 Supraventricular tachycardia: Secondary | ICD-10-CM | POA: Diagnosis not present

## 2020-10-10 DIAGNOSIS — N1831 Chronic kidney disease, stage 3a: Secondary | ICD-10-CM | POA: Diagnosis present

## 2020-10-10 DIAGNOSIS — K567 Ileus, unspecified: Secondary | ICD-10-CM

## 2020-10-10 DIAGNOSIS — Z452 Encounter for adjustment and management of vascular access device: Secondary | ICD-10-CM

## 2020-10-10 DIAGNOSIS — R6521 Severe sepsis with septic shock: Secondary | ICD-10-CM | POA: Diagnosis not present

## 2020-10-10 DIAGNOSIS — G928 Other toxic encephalopathy: Secondary | ICD-10-CM | POA: Diagnosis not present

## 2020-10-10 DIAGNOSIS — I130 Hypertensive heart and chronic kidney disease with heart failure and stage 1 through stage 4 chronic kidney disease, or unspecified chronic kidney disease: Secondary | ICD-10-CM | POA: Diagnosis present

## 2020-10-10 DIAGNOSIS — T794XXA Traumatic shock, initial encounter: Secondary | ICD-10-CM | POA: Diagnosis not present

## 2020-10-10 DIAGNOSIS — R570 Cardiogenic shock: Secondary | ICD-10-CM | POA: Diagnosis not present

## 2020-10-10 DIAGNOSIS — J80 Acute respiratory distress syndrome: Secondary | ICD-10-CM | POA: Diagnosis not present

## 2020-10-10 DIAGNOSIS — I5181 Takotsubo syndrome: Secondary | ICD-10-CM | POA: Diagnosis not present

## 2020-10-10 DIAGNOSIS — R0602 Shortness of breath: Secondary | ICD-10-CM

## 2020-10-10 DIAGNOSIS — E440 Moderate protein-calorie malnutrition: Secondary | ICD-10-CM | POA: Diagnosis present

## 2020-10-10 DIAGNOSIS — J9621 Acute and chronic respiratory failure with hypoxia: Secondary | ICD-10-CM

## 2020-10-10 DIAGNOSIS — K661 Hemoperitoneum: Secondary | ICD-10-CM | POA: Diagnosis not present

## 2020-10-10 DIAGNOSIS — R0603 Acute respiratory distress: Secondary | ICD-10-CM | POA: Diagnosis not present

## 2020-10-10 DIAGNOSIS — I21A1 Myocardial infarction type 2: Secondary | ICD-10-CM | POA: Diagnosis present

## 2020-10-10 DIAGNOSIS — I5021 Acute systolic (congestive) heart failure: Secondary | ICD-10-CM | POA: Diagnosis not present

## 2020-10-10 DIAGNOSIS — E872 Acidosis: Secondary | ICD-10-CM | POA: Diagnosis not present

## 2020-10-10 DIAGNOSIS — I509 Heart failure, unspecified: Secondary | ICD-10-CM

## 2020-10-10 DIAGNOSIS — I2510 Atherosclerotic heart disease of native coronary artery without angina pectoris: Secondary | ICD-10-CM | POA: Diagnosis present

## 2020-10-10 DIAGNOSIS — J9601 Acute respiratory failure with hypoxia: Secondary | ICD-10-CM | POA: Diagnosis not present

## 2020-10-10 DIAGNOSIS — I4892 Unspecified atrial flutter: Secondary | ICD-10-CM | POA: Diagnosis not present

## 2020-10-10 DIAGNOSIS — E870 Hyperosmolality and hypernatremia: Secondary | ICD-10-CM | POA: Diagnosis not present

## 2020-10-10 DIAGNOSIS — D62 Acute posthemorrhagic anemia: Secondary | ICD-10-CM | POA: Diagnosis not present

## 2020-10-10 DIAGNOSIS — F419 Anxiety disorder, unspecified: Secondary | ICD-10-CM | POA: Diagnosis present

## 2020-10-10 DIAGNOSIS — E1165 Type 2 diabetes mellitus with hyperglycemia: Secondary | ICD-10-CM | POA: Diagnosis not present

## 2020-10-10 DIAGNOSIS — N179 Acute kidney failure, unspecified: Secondary | ICD-10-CM

## 2020-10-10 DIAGNOSIS — I248 Other forms of acute ischemic heart disease: Secondary | ICD-10-CM | POA: Diagnosis not present

## 2020-10-10 DIAGNOSIS — J069 Acute upper respiratory infection, unspecified: Secondary | ICD-10-CM | POA: Diagnosis present

## 2020-10-10 DIAGNOSIS — E1122 Type 2 diabetes mellitus with diabetic chronic kidney disease: Secondary | ICD-10-CM | POA: Diagnosis present

## 2020-10-10 DIAGNOSIS — R58 Hemorrhage, not elsewhere classified: Secondary | ICD-10-CM | POA: Diagnosis not present

## 2020-10-10 DIAGNOSIS — G9340 Encephalopathy, unspecified: Secondary | ICD-10-CM

## 2020-10-10 DIAGNOSIS — J189 Pneumonia, unspecified organism: Secondary | ICD-10-CM

## 2020-10-10 DIAGNOSIS — R7989 Other specified abnormal findings of blood chemistry: Secondary | ICD-10-CM | POA: Diagnosis present

## 2020-10-10 DIAGNOSIS — E785 Hyperlipidemia, unspecified: Secondary | ICD-10-CM | POA: Diagnosis present

## 2020-10-10 DIAGNOSIS — K560 Paralytic ileus: Secondary | ICD-10-CM | POA: Diagnosis not present

## 2020-10-10 DIAGNOSIS — Z6822 Body mass index (BMI) 22.0-22.9, adult: Secondary | ICD-10-CM

## 2020-10-10 DIAGNOSIS — E876 Hypokalemia: Secondary | ICD-10-CM | POA: Diagnosis not present

## 2020-10-10 DIAGNOSIS — I358 Other nonrheumatic aortic valve disorders: Secondary | ICD-10-CM | POA: Diagnosis present

## 2020-10-10 DIAGNOSIS — R918 Other nonspecific abnormal finding of lung field: Secondary | ICD-10-CM | POA: Diagnosis not present

## 2020-10-10 DIAGNOSIS — Z4659 Encounter for fitting and adjustment of other gastrointestinal appliance and device: Secondary | ICD-10-CM

## 2020-10-10 DIAGNOSIS — M7989 Other specified soft tissue disorders: Secondary | ICD-10-CM | POA: Diagnosis not present

## 2020-10-10 DIAGNOSIS — Z9889 Other specified postprocedural states: Secondary | ICD-10-CM | POA: Diagnosis not present

## 2020-10-10 DIAGNOSIS — R578 Other shock: Secondary | ICD-10-CM | POA: Diagnosis not present

## 2020-10-10 HISTORY — DX: Dorsalgia, unspecified: M54.9

## 2020-10-10 LAB — COMPREHENSIVE METABOLIC PANEL
ALT: 24 U/L (ref 0–44)
AST: 29 U/L (ref 15–41)
Albumin: 3.3 g/dL — ABNORMAL LOW (ref 3.5–5.0)
Alkaline Phosphatase: 74 U/L (ref 38–126)
Anion gap: 12 (ref 5–15)
BUN: 22 mg/dL (ref 8–23)
CO2: 24 mmol/L (ref 22–32)
Calcium: 8.5 mg/dL — ABNORMAL LOW (ref 8.9–10.3)
Chloride: 103 mmol/L (ref 98–111)
Creatinine, Ser: 1.32 mg/dL — ABNORMAL HIGH (ref 0.61–1.24)
GFR, Estimated: 54 mL/min — ABNORMAL LOW (ref >60)
Glucose, Bld: 148 mg/dL — ABNORMAL HIGH (ref 70–99)
Potassium: 4 mmol/L (ref 3.5–5.1)
Sodium: 139 mmol/L (ref 135–145)
Total Bilirubin: 0.6 mg/dL (ref 0.3–1.2)
Total Protein: 6.2 g/dL — ABNORMAL LOW (ref 6.5–8.1)

## 2020-10-10 LAB — URINALYSIS, COMPLETE (UACMP) WITH MICROSCOPIC
Bacteria, UA: NONE SEEN
Bilirubin Urine: NEGATIVE
Glucose, UA: NEGATIVE mg/dL
Hgb urine dipstick: NEGATIVE
Ketones, ur: NEGATIVE mg/dL
Leukocytes,Ua: NEGATIVE
Nitrite: NEGATIVE
Protein, ur: NEGATIVE mg/dL
Specific Gravity, Urine: 1.013 (ref 1.005–1.030)
pH: 6 (ref 5.0–8.0)

## 2020-10-10 LAB — CBC WITH DIFFERENTIAL/PLATELET
Abs Immature Granulocytes: 0.03 10*3/uL (ref 0.00–0.07)
Basophils Absolute: 0 10*3/uL (ref 0.0–0.1)
Basophils Relative: 1 %
Eosinophils Absolute: 0.2 10*3/uL (ref 0.0–0.5)
Eosinophils Relative: 3 %
HCT: 29 % — ABNORMAL LOW (ref 39.0–52.0)
Hemoglobin: 9.8 g/dL — ABNORMAL LOW (ref 13.0–17.0)
Immature Granulocytes: 1 %
Lymphocytes Relative: 26 %
Lymphs Abs: 1.6 10*3/uL (ref 0.7–4.0)
MCH: 30.8 pg (ref 26.0–34.0)
MCHC: 33.8 g/dL (ref 30.0–36.0)
MCV: 91.2 fL (ref 80.0–100.0)
Monocytes Absolute: 0.8 10*3/uL (ref 0.1–1.0)
Monocytes Relative: 13 %
Neutro Abs: 3.5 10*3/uL (ref 1.7–7.7)
Neutrophils Relative %: 56 %
Platelets: 255 10*3/uL (ref 150–400)
RBC: 3.18 MIL/uL — ABNORMAL LOW (ref 4.22–5.81)
RDW: 12 % (ref 11.5–15.5)
WBC: 6.1 10*3/uL (ref 4.0–10.5)
nRBC: 0 % (ref 0.0–0.2)

## 2020-10-10 LAB — RESP PANEL BY RT-PCR (FLU A&B, COVID) ARPGX2
Influenza A by PCR: NEGATIVE
Influenza B by PCR: NEGATIVE
SARS Coronavirus 2 by RT PCR: NEGATIVE

## 2020-10-10 MED ORDER — PREGABALIN 75 MG PO CAPS
75.0000 mg | ORAL_CAPSULE | Freq: Two times a day (BID) | ORAL | Status: DC
  Administered 2020-10-11 (×2): 75 mg via ORAL
  Filled 2020-10-10 (×2): qty 1

## 2020-10-10 MED ORDER — POLYETHYLENE GLYCOL 3350 17 G PO PACK
17.0000 g | PACK | Freq: Every day | ORAL | Status: DC
  Administered 2020-10-12 – 2020-10-13 (×2): 17 g via ORAL
  Filled 2020-10-10 (×4): qty 1

## 2020-10-10 MED ORDER — EZETIMIBE 10 MG PO TABS
10.0000 mg | ORAL_TABLET | Freq: Every day | ORAL | Status: DC
  Administered 2020-10-10 – 2020-10-12 (×3): 10 mg via ORAL
  Filled 2020-10-10 (×3): qty 1

## 2020-10-10 MED ORDER — SODIUM CHLORIDE 0.9% FLUSH: 9.0000 mL | INTRAVENOUS | Status: DC | PRN

## 2020-10-10 MED ORDER — KETOROLAC TROMETHAMINE 15 MG/ML IJ SOLN
15.0000 mg | Freq: Four times a day (QID) | INTRAMUSCULAR | Status: DC
  Administered 2020-10-10 – 2020-10-13 (×11): 15 mg via INTRAVENOUS
  Filled 2020-10-10 (×11): qty 1

## 2020-10-10 MED ORDER — ONDANSETRON HCL 4 MG PO TABS: 4.0000 mg | ORAL_TABLET | Freq: Four times a day (QID) | ORAL | Status: DC | PRN

## 2020-10-10 MED ORDER — DOCUSATE SODIUM 100 MG PO CAPS
100.0000 mg | ORAL_CAPSULE | Freq: Three times a day (TID) | ORAL | Status: DC
  Administered 2020-10-11 – 2020-10-13 (×4): 100 mg via ORAL
  Filled 2020-10-10 (×6): qty 1

## 2020-10-10 MED ORDER — HYDROMORPHONE 1 MG/ML IV SOLN
INTRAVENOUS | Status: DC
  Administered 2020-10-10: 0 mg via INTRAVENOUS
  Administered 2020-10-10: 30 mg via INTRAVENOUS
  Administered 2020-10-10: 1.1 mg via INTRAVENOUS
  Administered 2020-10-11: 0 mg via INTRAVENOUS
  Administered 2020-10-11: 1.2 mg via INTRAVENOUS
  Administered 2020-10-11: 0.3 mg via INTRAVENOUS
  Filled 2020-10-10: qty 30

## 2020-10-10 MED ORDER — DOCUSATE SODIUM 100 MG PO CAPS: 100.0000 mg | ORAL_CAPSULE | Freq: Two times a day (BID) | ORAL | Status: DC

## 2020-10-10 MED ORDER — DEXAMETHASONE SODIUM PHOSPHATE 10 MG/ML IJ SOLN
10.0000 mg | Freq: Four times a day (QID) | INTRAMUSCULAR | Status: DC
  Administered 2020-10-10 – 2020-10-12 (×7): 10 mg via INTRAVENOUS
  Filled 2020-10-10 (×8): qty 1

## 2020-10-10 MED ORDER — DIPHENHYDRAMINE HCL 12.5 MG/5ML PO ELIX: 12.5000 mg | ORAL_SOLUTION | Freq: Four times a day (QID) | ORAL | Status: DC | PRN

## 2020-10-10 MED ORDER — ASPIRIN EC 81 MG PO TBEC
81.0000 mg | DELAYED_RELEASE_TABLET | Freq: Every day | ORAL | Status: DC
  Administered 2020-10-10 – 2020-10-13 (×4): 81 mg via ORAL
  Filled 2020-10-10 (×4): qty 1

## 2020-10-10 MED ORDER — POLYETHYLENE GLYCOL 3350 17 GM/SCOOP PO POWD
17.0000 g | Freq: Every day | ORAL | Status: DC
  Filled 2020-10-10: qty 255

## 2020-10-10 MED ORDER — NALOXONE HCL 0.4 MG/ML IJ SOLN
0.4000 mg | INTRAMUSCULAR | Status: DC | PRN
  Administered 2020-10-11 (×2): 0.4 mg via INTRAVENOUS
  Filled 2020-10-10 (×2): qty 1

## 2020-10-10 MED ORDER — METHOCARBAMOL 1000 MG/10ML IJ SOLN
500.0000 mg | Freq: Four times a day (QID) | INTRAVENOUS | Status: DC | PRN
  Administered 2020-10-12 (×2): 500 mg via INTRAVENOUS
  Filled 2020-10-10 (×3): qty 5

## 2020-10-10 MED ORDER — SODIUM CHLORIDE 0.9 % IV SOLN: INTRAVENOUS | Status: DC

## 2020-10-10 MED ORDER — ONDANSETRON HCL 4 MG/2ML IJ SOLN: 4.0000 mg | Freq: Four times a day (QID) | INTRAMUSCULAR | Status: DC | PRN

## 2020-10-10 MED ORDER — ROSUVASTATIN CALCIUM 20 MG PO TABS
20.0000 mg | ORAL_TABLET | Freq: Every day | ORAL | Status: DC
  Administered 2020-10-10 – 2020-10-12 (×3): 20 mg via ORAL
  Filled 2020-10-10 (×3): qty 1

## 2020-10-10 MED ORDER — DIAZEPAM 5 MG PO TABS
2.5000 mg | ORAL_TABLET | Freq: Four times a day (QID) | ORAL | Status: DC
  Administered 2020-10-11 (×2): 2.5 mg via ORAL
  Filled 2020-10-10 (×2): qty 1

## 2020-10-10 MED ORDER — DIPHENHYDRAMINE HCL 50 MG/ML IJ SOLN: 12.5000 mg | Freq: Four times a day (QID) | INTRAMUSCULAR | Status: DC | PRN

## 2020-10-10 NOTE — H&P (Signed)
Dennis Carey is an 85 y.o. male.   Chief Complaint: Back pain HPI: 85 year old male status post L5-S1 decompression and fusion surgery approximately 10 days ago.  Patient has been having difficulty with poorly controlled back and bilateral lower extremity pain.  Situation is complicated by severe deconditioning which was present preoperatively and now his situation is further complicated by an upper respiratory infection (Covid and flu negative).  He has been running low-grade fevers.  He has no wound drainage.  He has no wound erythema or other signs of infection.  Patient is not thriving at home and needs hospital admission for hydration, pain control and mobilization.  The patient is having no new symptoms of weakness or sensory loss.  He is having no bowel or bladder dysfunction.  He has a mild nonproductive cough.  He has no frank shortness of breath.  He is having no chest pain.  His wife and family members of all had difficulty with this upper respiratory infection.  Past Medical History:  Diagnosis Date  . Anxiety   . CAD (coronary artery disease)    significant CAD by cardiac calcium scoring test  . CKD (chronic kidney disease), stage III ([place])    by serum creatinine is stable  . HTN (hypertension)    pt denies   . Hyperlipidemia   . Microhematuria   . PSA elevation 2014   4.75 February 2017, 6.9 in 2/18 and declines urologist eval or trial of antibiotics    Past Surgical History:  Procedure Laterality Date  . dental implants     3 of them 2008  . LUMBAR LAMINECTOMY/DECOMPRESSION MICRODISCECTOMY Right 04/20/2020   Procedure: Laminectomy and Foraminotomy - right - Lumbar five-Sacral one;  Surgeon: [name], MD;  Location: [facility];  Service: Neurosurgery;  Laterality: Right;  . LUMBAR LAMINECTOMY/DECOMPRESSION MICRODISCECTOMY Right 06/08/2020   Procedure: Right Lumbar Two-Three Laminectomy and Foraminotomy;  Surgeon: [name], MD;  Location: [facility];  Service: Neurosurgery;   Laterality: Right;  . submucous resection nose     1956    Family History  Problem Relation Age of Onset  . Colon cancer Neg Hx   . Migraines Neg Hx    Social History:  reports that he has never smoked. He has never used smokeless tobacco. He reports current alcohol use. He reports that he does not use drugs.  Allergies:  Allergies  Allergen Reactions  . Sulfa Antibiotics Hives    Medications Prior to Admission  Medication Sig Dispense Refill  . aspirin 81 MG tablet Take 81 mg by mouth daily.    . diazepam (VALIUM) 5 MG tablet Take 1 tablet (5 mg total) by mouth every 6 (six) hours as needed for muscle spasms. (Patient taking differently: Take 2.5 mg by mouth every 6 (six) hours. ) 30 tablet 0  . docusate sodium (COLACE) 100 MG capsule Take 100 mg by mouth 3 (three) times daily with meals.     Marland Kitchen ezetimibe (ZETIA) 10 MG tablet Take 10 mg by mouth at bedtime.     Marland Kitchen HYDROcodone-acetaminophen (NORCO/VICODIN) 5-325 MG tablet Take 1 tablet by mouth every 4 (four) hours.     . polyethylene glycol powder (GLYCOLAX/MIRALAX) 17 GM/SCOOP powder Take 17 g by mouth See admin instructions. Mix 17 grams of powder into 4 ounces of water and drink every morning    . pregabalin (LYRICA) 75 MG capsule Take 75 mg by mouth 2 (two) times daily.    . rosuvastatin (CRESTOR) 20 MG tablet Take  20 mg by mouth at bedtime.     Marland Kitchen oxyCODONE 10 MG TABS Take 1 tablet (10 mg total) by mouth every 3 (three) hours as needed for severe pain ((score 7 to 10)). (Patient not taking: Reported on 10/23/2020) 40 tablet 0    Results for orders placed or performed during the hospital encounter of 10/19/2020 (from the past 48 hour(s))  Urinalysis, Complete w Microscopic Urine, Clean Catch     Status: None   Collection Time: 10/15/2020  4:54 PM  Result Value Ref Range   Color, Urine YELLOW YELLOW   APPearance CLEAR CLEAR   Specific Gravity, Urine 1.013 1.005 - 1.030   pH 6.0 5.0 - 8.0   Glucose, UA NEGATIVE NEGATIVE mg/dL   Hgb  urine dipstick NEGATIVE NEGATIVE   Bilirubin Urine NEGATIVE NEGATIVE   Ketones, ur NEGATIVE NEGATIVE mg/dL   Protein, ur NEGATIVE NEGATIVE mg/dL   Nitrite NEGATIVE NEGATIVE   Leukocytes,Ua NEGATIVE NEGATIVE   RBC / HPF 0-5 0 - 5 RBC/hpf   WBC, UA 0-5 0 - 5 WBC/hpf   Bacteria, UA NONE SEEN NONE SEEN   Mucus PRESENT     Comment: Performed at [facility], [address] [place], [place] [address]  Resp Panel by RT-PCR (Flu A&B, Covid) Nasopharyngeal Swab     Status: None   Collection Time: 10/27/2020  5:05 PM   Specimen: Nasopharyngeal Swab; Nasopharyngeal(NP) swabs in vial transport medium  Result Value Ref Range   SARS Coronavirus 2 by RT PCR NEGATIVE NEGATIVE    Comment: (NOTE) SARS-CoV-2 target nucleic acids are NOT DETECTED.  The SARS-CoV-2 RNA is generally detectable in upper respiratory specimens during the acute phase of infection. The lowest concentration of SARS-CoV-2 viral copies this assay can detect is 138 copies/mL. A negative result does not preclude SARS-Cov-2 infection and should not be used as the sole basis for treatment or other patient management decisions. A negative result may occur with  improper specimen collection/handling, submission of specimen other than nasopharyngeal swab, presence of viral mutation(s) within the areas targeted by this assay, and inadequate number of viral copies(<138 copies/mL). A negative result must be combined with clinical observations, patient history, and epidemiological information. The expected result is Negative.  Fact Sheet for Patients:  EntrepreneurPulse.com.au  Fact Sheet for Healthcare Providers:  IncredibleEmployment.be  This test is no t yet approved or cleared by the [place] FDA and  has been authorized for detection and/or diagnosis of SARS-CoV-2 by FDA under an Emergency Use Authorization (EUA). This EUA will remain  in effect (meaning this test can be used) for the  duration of the COVID-19 declaration under Section 564(b)(1) of the Act, 21 U.S.C.section 360bbb-3(b)(1), unless the authorization is terminated  or revoked sooner.       Influenza A by PCR NEGATIVE NEGATIVE   Influenza B by PCR NEGATIVE NEGATIVE    Comment: (NOTE) The Xpert Xpress SARS-CoV-2/FLU/RSV plus assay is intended as an aid in the diagnosis of influenza from Nasopharyngeal swab specimens and should not be used as a sole basis for treatment. Nasal washings and aspirates are unacceptable for Xpert Xpress SARS-CoV-2/FLU/RSV testing.  Fact Sheet for Patients: EntrepreneurPulse.com.au  Fact Sheet for Healthcare Providers: IncredibleEmployment.be  This test is not yet approved or cleared by the [place] FDA and has been authorized for detection and/or diagnosis of SARS-CoV-2 by FDA under an Emergency Use Authorization (EUA). This EUA will remain in effect (meaning this test can be used) for the duration of  the COVID-19 declaration under Section 564(b)(1) of the Act, 21 U.S.C. section 360bbb-3(b)(1), unless the authorization is terminated or revoked.  Performed at [facility], [place] [address] [place], [place] [address]   CBC WITH DIFFERENTIAL     Status: Abnormal   Collection Time: 10/29/2020  5:59 PM  Result Value Ref Range   WBC 6.1 4.0 - 10.5 K/uL   RBC 3.18 (L) 4.22 - 5.81 MIL/uL   Hemoglobin 9.8 (L) 13.0 - 17.0 g/dL   HCT 29.0 (L) 39 - 52 %   MCV 91.2 80.0 - 100.0 fL   MCH 30.8 26.0 - 34.0 pg   MCHC 33.8 30.0 - 36.0 g/dL   RDW 12.0 11.5 - 15.5 %   Platelets 255 150 - 400 K/uL   nRBC 0.0 0.0 - 0.2 %   Neutrophils Relative % 56 %   Neutro Abs 3.5 1.7 - 7.7 K/uL   Lymphocytes Relative 26 %   Lymphs Abs 1.6 0.7 - 4.0 K/uL   Monocytes Relative 13 %   Monocytes Absolute 0.8 0.1 - 1.0 K/uL   Eosinophils Relative 3 %   Eosinophils Absolute 0.2 0.0 - 0.5 K/uL   Basophils Relative 1 %   Basophils Absolute 0.0 0.0 - 0.1  K/uL   Immature Granulocytes 1 %   Abs Immature Granulocytes 0.03 0.00 - 0.07 K/uL    Comment: Performed at [facility] [address], [place], [place] [address]   No results found.  Pertinent items noted in HPI and remainder of comprehensive ROS otherwise negative.  Blood pressure 119/62, pulse 92, temperature 99.1 F (37.3 C), temperature source Oral, resp. rate (!) 28, SpO2 96 %.  Patient is somewhat lethargic and obviously is in a great deal of pain.  He awakens and answers simple questions.  He is oriented and otherwise appropriate.  His speech is fluent.  His cranial nerve function is normal bilaterally.  Motor examination extremities reveals 5/5 strength bilaterally.  He has no significant pain with straight raising.  He does have some neuropathic pain with palpation of his lateral legs bilaterally.  His wound is clean dry and intact.  There is appropriate postoperative swelling but no evidence of infection.  Examination his head ears eyes nose and throat is unremarked.  Chest and abdomen are benign.  Extremities are free from injury or deformity although there is some generalized edema. Assessment/Plan Patient with postoperative pain crisis with significant deconditioning and failure to thrive at home.  Follow-up x-rays in the office demonstrated good appearance of his lumbar decompression and fusion without evidence of complicating features.  I see no evidence of ongoing wound infection or other significant wound related complication.  Plan is for admission for pain control, IV hydration, mobilization with therapy.  Patient will be evaluated for possible inpatient rehabilitation as he improves.  Mallie Mussel A Skila Rollins 10/25/2020, 7:20 PM

## 2020-10-10 NOTE — Progress Notes (Signed)
Patient arrived to room 6N1 from home via wheelchair. Patient is alert and oriented times 4. VSS. NAD. Belongings and call bell within reach. Bed is in the lowest and locked position with bed rails up times 2. Patient oriented to unit and call bell. Patient updated as to plan of care and all questions addressed at this time.

## 2020-10-11 ENCOUNTER — Other Ambulatory Visit: Payer: Self-pay

## 2020-10-11 ENCOUNTER — Inpatient Hospital Stay (HOSPITAL_COMMUNITY): Payer: Medicare Other

## 2020-10-11 ENCOUNTER — Encounter (HOSPITAL_COMMUNITY): Payer: Self-pay | Admitting: Neurosurgery

## 2020-10-11 LAB — GLUCOSE, CAPILLARY: Glucose-Capillary: 328 mg/dL — ABNORMAL HIGH (ref 70–99)

## 2020-10-11 MED ORDER — FLUMAZENIL 0.5 MG/5ML IV SOLN
0.2000 mg | Freq: Once | INTRAVENOUS | Status: AC
  Administered 2020-10-11: 0.2 mg via INTRAVENOUS
  Filled 2020-10-11: qty 5

## 2020-10-11 MED ORDER — NALOXONE HCL 0.4 MG/ML IJ SOLN
INTRAMUSCULAR | Status: AC
  Filled 2020-10-11: qty 1

## 2020-10-11 MED ORDER — TRAMADOL HCL 50 MG PO TABS
50.0000 mg | ORAL_TABLET | Freq: Four times a day (QID) | ORAL | Status: DC | PRN
  Administered 2020-10-12 – 2020-10-13 (×3): 50 mg via ORAL
  Filled 2020-10-11 (×3): qty 1

## 2020-10-11 NOTE — Progress Notes (Signed)
OT Cancellation Note  Patient Details Name: Dennis Carey MRN: [id number] DOB: 07-Jan-1935   Cancelled Treatment:    Reason Eval/Treat Not Completed: Patient's level of consciousness;Medical issues which prohibited therapy.  Attempted x 2 to see pt today. Pt had just fallen asleep this morning per family with first attempt, OT returned later in afternoon and pt unable to wake/arouse although snoring. Multiple RNs now in with pt. OT will follow up next available time as appropriate [place] 10/11/2020, 2:24 PM

## 2020-10-11 NOTE — Significant Event (Signed)
Rapid Response Event Note   Reason for Call :  Unresponsive Patient came in as direct admit yesterday per Son he was having 10/10 back pain post surgery a couple of weeks ago. He has had 2.5 mg Dilaudid and 5mg  Valium since admission.  Nothing since 0700 this am  Initial Focused Assessment:  He is sitting in the chair will grimace to deep painful stimuli but otherwise unresponsive.  Assisted back to bed he is limp and has snoring respirations. BP 104/63  HR 82  RR 18  O2 sat 96% on  Lung sounds decreased bases Heart tone regular CBG 328  Son at bedside during event  Interventions:  0.4 mg Narcan x 3 0.2 mg Romazicon Painful stimuli Nasal Trumpet placed until more awake  After 3rd dose of Narcan he is a little responsive.  He is able to move all extremities.  He will occasionally cough up large amounts of secretions.   Suction at bedside  Plan of Care:  Monitor Level of Consciousness Notify if patient becomes difficult to arouse again   Event Summary:   MD Notified: Dr Annette Stable Call Time: [place] Time:  0092 End Time:  [place]  Raliegh Ip, RN

## 2020-10-11 NOTE — Progress Notes (Signed)
Patient with difficulty with extreme lethargy today.  Evaluation was consistent with narcotic overdose from his PCA and other medications.  The patient received multiple rounds of Narcan with some improvement.  Currently he is now awake.  He is conversant.  He is following commands.  He denies back pain.  He is willing to get out of bed and work on pulmonary toilet.  We will check follow-up chest x-ray as there is some concern about possible aspiration with his oversedation today.

## 2020-10-11 NOTE — Progress Notes (Signed)
No events or problems overnight.  Back pain is better controlled.  Lower extremity pain improved.  Patient still somewhat lethargic but overall brighter than yesterday.  Afebrile.  Vital signs are stable.  He is awake and aware.  He is oriented and appropriate.  Motor and sensory function of the extremities are intact.  Wound is clean and dry.  Abdomen is soft.  Labs yesterday were unremarkable.  Chest x-ray was negative.  Overall more comfortable with aggressive pain control.  Continue current medications and efforts at mobilization.  Patient and family request rehabilitation medicine consult for possible inpatient rehabilitation prior to discharge home.

## 2020-10-11 NOTE — Progress Notes (Signed)
PIV consult: Canceled by RN. Site established.

## 2020-10-11 NOTE — Plan of Care (Signed)
  Problem: Health Behavior/Discharge Planning: Goal: Ability to manage health-related needs will improve Outcome: Progressing   

## 2020-10-11 NOTE — Progress Notes (Signed)
Wasted 25 ml of Dilaudid PCA  In stericycle bin witnessed waste by Signe Colt RN.

## 2020-10-11 NOTE — Progress Notes (Signed)
RT called to bedside. Pt has snoring respirations. Nasal trumpet was placed with poor aerosol. Will continue to monitor pt.

## 2020-10-11 NOTE — Plan of Care (Signed)
°  Problem: Education: Goal: Knowledge of General Education information will improve Description: Including pain rating scale, medication(s)/side effects and non-pharmacologic comfort measures Outcome: Progressing   Problem: Health Behavior/Discharge Planning: Goal: Ability to manage health-related needs will improve Outcome: Progressing   Problem: Clinical Measurements: Goal: Will remain free from infection Outcome: Progressing Goal: Diagnostic test results will improve Outcome: Progressing Goal: Cardiovascular complication will be avoided Outcome: Progressing   Problem: Activity: Goal: Risk for activity intolerance will decrease Outcome: Progressing   Problem: Nutrition: Goal: Adequate nutrition will be maintained Outcome: Progressing

## 2020-10-11 NOTE — Evaluation (Signed)
Physical Therapy Evaluation Patient Details Name: Dennis Carey MRN: [id number] DOB: 1935/04/12 Today's Date: 10/11/2020   History of Present Illness  The pt is an 85 yo male s/p PLIF L5-S1 on 11/19, return to [facility] on 12/1 for failure to thrive, severe pain. The pt has had multiple prior lumbar surgeries in June and July of this year. Other PMH includes; CAD, CKD III, HTN, and HLD.  Clinical Impression   Pt presents with back pain, altered mental status vs baseline, difficulty performing all mobility tasks, decreased activity tolerance, and decreased knowledge and application of back precautions. Pt to benefit from acute PT to address deficits. Pt requiring mod-max assist for bed mobility and transfer to recliner at bedside this day, pt limited by AMS and drowsiness. Of note, pt sleeping with snoring respirations upon arrival to room, very difficult to wake. PT was alarmed by pt's difficulty waking so RN called to room, who was able to wake pt up. No intervention by RN provided at that time, and pt more awake and alert once sitting EOB but still confused. At baseline prior to lumbar fusion, pt ambulatory with cane and was independent with ADLs. PT recommending CIR level of care post-acutely to maximize pt functional recovery. PT to progress mobility as tolerated, and will continue to follow acutely.      Follow Up Recommendations CIR    Equipment Recommendations  None recommended by PT (none needed)    Recommendations for Other Services Rehab consult     Precautions / Restrictions Precautions Precautions: Fall;Back Precaution Comments: reviewed back precautions: BLT, log roll out of bed Required Braces or Orthoses: Spinal Brace Spinal Brace: Lumbar corset;Applied in sitting position Restrictions Weight Bearing Restrictions: No      Mobility  Bed Mobility Overal bed mobility: Needs Assistance Bed Mobility: Rolling;Sidelying to Sit Rolling: Max assist Sidelying to sit: Max assist        General bed mobility comments: max assist for trunk and LE management, partially due to low level of arousal, log roll technique utilized. Pt waking up more sitting EOB and more conversant    Transfers Overall transfer level: Needs assistance Equipment used: Rolling walker (2 wheeled) Transfers: Sit to/from Stand Sit to Stand: Mod assist;From elevated surface Stand pivot transfers: Mod assist;From elevated surface       General transfer comment: Mod assist for power up, steadying, and transfer to recliner via short pivotsl steps. Verbal cuing for step-by-step sequencing.  Ambulation/Gait             General Gait Details: NT  Stairs            Wheelchair Mobility    Modified Rankin (Stroke Patients Only)       Balance Overall balance assessment: Needs assistance Sitting-balance support: Feet supported;No upper extremity supported Sitting balance-Leahy Scale: Fair Sitting balance - Comments: able to sit EOB without PT assist   Standing balance support: Single extremity supported;During functional activity Standing balance-Leahy Scale: Poor Standing balance comment: requires external assist                             Pertinent Vitals/Pain Pain Assessment: Faces Faces Pain Scale: Hurts a little bit Pain Location: back Pain Descriptors / Indicators: Discomfort Pain Intervention(s): Limited activity within patient's tolerance;Monitored during session;Repositioned    Home Living Family/patient expects to be discharged to:: Private residence Living Arrangements: Spouse/significant other Available Help at Discharge: Family Type of Home: House Home Access: Stairs  to enter Entrance Stairs-Rails: Right Entrance Stairs-Number of Steps: 8 Home Layout: Two level;Able to live on main level with bedroom/bathroom Home Equipment: Gilford Rile - 2 wheels;Cane - single point;Grab bars - tub/shower Additional Comments: Pt wife confirmed home set-up and  support    Prior Function Level of Independence: Needs assistance   Gait / Transfers Assistance Needed: pt using RW for ambulation since fusion, requires +1-2 for ambulation since lumbar surgery  ADL's / Homemaking Assistance Needed: Assist for toileting, other ADLs        Hand Dominance   Dominant Hand: Right    Extremity/Trunk Assessment   Upper Extremity Assessment Upper Extremity Assessment: Defer to OT evaluation    Lower Extremity Assessment Lower Extremity Assessment: Generalized weakness    Cervical / Trunk Assessment Cervical / Trunk Assessment: Other exceptions Cervical / Trunk Exceptions: lumbar/sacral sx  Communication   Communication: No difficulties  Cognition Arousal/Alertness: Lethargic Behavior During Therapy: WFL for tasks assessed/performed Overall Cognitive Status: Impaired/Different from baseline Area of Impairment: Orientation;Attention;Following commands;Safety/judgement;Problem solving;Awareness                 Orientation Level: Disoriented to;Time;Situation Current Attention Level: Selective   Following Commands: Follows one step commands consistently Safety/Judgement: Decreased awareness of safety;Decreased awareness of deficits Awareness: Intellectual Problem Solving: Slow processing;Decreased initiation;Difficulty sequencing;Requires verbal cues;Requires tactile cues General Comments: Pt sleeping with snoring respirations upon arrival to room, very difficult to wake. PT was alarmed by pt's difficulty waking so RN called to room, who was able to wake pt up. No intervention by RN provided. Once awake, pt is oriented to self and location, but not month and states "are you working with my wife here too?", but wife is at home. Pt became tearful x2, spoke about late father sitting EOB and per son pt appeared "out of it". Pt benefits from short, one step commands.      General Comments      Exercises     Assessment/Plan    PT  Assessment Patient needs continued PT services  PT Problem List Decreased balance;Decreased strength;Decreased mobility;Decreased safety awareness;Decreased cognition;Decreased activity tolerance;Decreased knowledge of use of DME;Pain;Decreased range of motion;Decreased knowledge of precautions;Decreased coordination       PT Treatment Interventions DME instruction;Gait training;Stair training;Functional mobility training;Therapeutic activities;Therapeutic exercise;Balance training;Patient/family education;Neuromuscular re-education    PT Goals (Current goals can be found in the Care Plan section)  Acute Rehab PT Goals Patient Stated Goal: Per son, to get to post-acute rehab PT Goal Formulation: With patient/family Time For Goal Achievement: 10/25/20 Potential to Achieve Goals: Good    Frequency Min 5X/week   Barriers to discharge        Co-evaluation               AM-PAC PT "6 Clicks" Mobility  Outcome Measure Help needed turning from your back to your side while in a flat bed without using bedrails?: A Lot Help needed moving from lying on your back to sitting on the side of a flat bed without using bedrails?: A Lot Help needed moving to and from a bed to a chair (including a wheelchair)?: A Lot Help needed standing up from a chair using your arms (e.g., wheelchair or bedside chair)?: A Lot Help needed to walk in hospital room?: A Lot Help needed climbing 3-5 steps with a railing? : Total 6 Click Score: 11    End of Session Equipment Utilized During Treatment: Back brace;Oxygen Activity Tolerance: Patient limited by fatigue;Other (comment) (altered cognition) Patient left:  in chair;with call bell/phone within reach;with family/visitor present;with chair alarm set Nurse Communication: Mobility status;Other (comment) (AMS, lethargy at start of session) PT Visit Diagnosis: Other abnormalities of gait and mobility (R26.89);Muscle weakness (generalized) (M62.81);Unsteadiness on  feet (R26.81)    Time: 8887-5797 PT Time Calculation (min) (ACUTE ONLY): 32 min   Charges:   PT Evaluation $PT Eval Low Complexity: 1 Low PT Treatments $Therapeutic Activity: 8-22 mins      Keiton Cosma E, PT Acute Rehabilitation Services Pager [phone]  Office [phone]   Maxamillian Tienda D [name] 10/11/2020, 3:10 PM

## 2020-10-12 ENCOUNTER — Inpatient Hospital Stay (HOSPITAL_COMMUNITY): Payer: Medicare Other

## 2020-10-12 DIAGNOSIS — R609 Edema, unspecified: Secondary | ICD-10-CM | POA: Diagnosis not present

## 2020-10-12 LAB — GLUCOSE, CAPILLARY
Glucose-Capillary: 324 mg/dL — ABNORMAL HIGH (ref 70–99)
Glucose-Capillary: 315 mg/dL — ABNORMAL HIGH (ref 70–99)

## 2020-10-12 LAB — HEMOGLOBIN A1C
Hgb A1c MFr Bld: 7.7 % — ABNORMAL HIGH (ref 4.8–5.6)
Mean Plasma Glucose: 174.29 mg/dL

## 2020-10-12 MED ORDER — METHYLPREDNISOLONE 4 MG PO TBPK: 4.0000 mg | ORAL_TABLET | Freq: Four times a day (QID) | ORAL | Status: DC

## 2020-10-12 MED ORDER — METHYLPREDNISOLONE 4 MG PO TBPK: 8.0000 mg | ORAL_TABLET | Freq: Every evening | ORAL | Status: DC

## 2020-10-12 MED ORDER — ENOXAPARIN SODIUM 40 MG/0.4ML ~~LOC~~ SOLN
40.0000 mg | SUBCUTANEOUS | Status: DC
  Administered 2020-10-12 – 2020-10-13 (×2): 40 mg via SUBCUTANEOUS
  Filled 2020-10-12 (×2): qty 0.4

## 2020-10-12 MED ORDER — OXYCODONE HCL 5 MG PO TABS
5.0000 mg | ORAL_TABLET | ORAL | Status: DC | PRN
  Administered 2020-10-12 – 2020-10-13 (×4): 5 mg via ORAL
  Filled 2020-10-12 (×4): qty 1

## 2020-10-12 MED ORDER — INSULIN ASPART 100 UNIT/ML ~~LOC~~ SOLN
0.0000 [IU] | Freq: Three times a day (TID) | SUBCUTANEOUS | Status: DC
  Administered 2020-10-12: 7 [IU] via SUBCUTANEOUS
  Administered 2020-10-13 (×2): 2 [IU] via SUBCUTANEOUS

## 2020-10-12 MED ORDER — METHYLPREDNISOLONE 4 MG PO TBPK
8.0000 mg | ORAL_TABLET | Freq: Every morning | ORAL | Status: AC
  Administered 2020-10-12: 8 mg via ORAL
  Filled 2020-10-12: qty 21

## 2020-10-12 MED ORDER — METHYLPREDNISOLONE 4 MG PO TBPK
4.0000 mg | ORAL_TABLET | Freq: Three times a day (TID) | ORAL | Status: DC
  Administered 2020-10-13 (×2): 4 mg via ORAL

## 2020-10-12 MED ORDER — METHYLPREDNISOLONE 4 MG PO TBPK
4.0000 mg | ORAL_TABLET | ORAL | Status: AC
  Administered 2020-10-12: 4 mg via ORAL

## 2020-10-12 MED ORDER — METHYLPREDNISOLONE 4 MG PO TBPK
8.0000 mg | ORAL_TABLET | Freq: Every evening | ORAL | Status: AC
  Administered 2020-10-12: 8 mg via ORAL

## 2020-10-12 NOTE — Plan of Care (Signed)
  Problem: Education: Goal: Knowledge of General Education information will improve Description Including pain rating scale, medication(s)/side effects and non-pharmacologic comfort measures Outcome: Progressing   

## 2020-10-12 NOTE — Progress Notes (Signed)
Physical Therapy Treatment Patient Details Name: Dennis Carey MRN: [id number] DOB: 05/13/1935 Today's Date: 10/12/2020    History of Present Illness The pt is an 85 yo male s/p PLIF L5-S1 on 11/19, return to [facility] on 12/1 for failure to thrive, severe pain. The pt has had multiple prior lumbar surgeries in June and July of this year. Other PMH includes; CAD, CKD III, HTN, and HLD.    PT Comments    Pt more alert and oriented today after incident yesterday, with no recall of yesterday's events. Pt ambulatory for short hallway distance today, requiring min assist overall with max multimodal cuing for form and safety during mobility. Pt's son at bedside and disappointed about CIR denial, requesting contact with CIR admissions, CIR admissions coordinator Dennis Carey secured chatted by this PT. PT to continue to follow acutely, will progress mobility as able.    Follow Up Recommendations  CIR;SNF     Equipment Recommendations  None recommended by PT (none needed)    Recommendations for Other Services       Precautions / Restrictions Precautions Precautions: Fall;Back Precaution Comments: reviewed back precautions: BLT Required Braces or Orthoses: Spinal Brace Spinal Brace: Lumbar corset;Applied in sitting position Restrictions Weight Bearing Restrictions: No    Mobility  Bed Mobility Overal bed mobility: Needs Assistance Bed Mobility: Rolling;Sidelying to Sit Rolling: Min guard Sidelying to sit: Min assist;HOB elevated       General bed mobility comments: min assist for translation of LEs over EOB, scoot to EOB with increased time and effort. Verbal cuing for log roll technique  Transfers Overall transfer level: Needs assistance Equipment used: Rolling walker (2 wheeled) Transfers: Sit to/from Stand Sit to Stand: Min assist Stand pivot transfers: Min guard       General transfer comment: Min assist for power up, rise, and steady. Verbal cuing for safe hand placement  when rising/sitting  Ambulation/Gait Ambulation/Gait assistance: Min assist Gait Distance (Feet): 80 Feet Assistive device: Rolling walker (2 wheeled) Gait Pattern/deviations: Step-through pattern;Decreased stride length;Trunk flexed;Narrow base of support Gait velocity: DECR   General Gait Details: Min assist to steady, physically maneuver RW. Max multimodal cuing for upright posture and placement in RW.   Stairs             Wheelchair Mobility    Modified Rankin (Stroke Patients Only)       Balance Overall balance assessment: Needs assistance Sitting-balance support: Feet supported;No upper extremity supported Sitting balance-Leahy Scale: Fair     Standing balance support: Single extremity supported;During functional activity Standing balance-Leahy Scale: Poor Standing balance comment: requires external assist                            Cognition Arousal/Alertness: Awake/alert Behavior During Therapy: WFL for tasks assessed/performed Overall Cognitive Status: Within Functional Limits for tasks assessed                                 General Comments: Pt with no recollection of session yesterday      Exercises      General Comments        Pertinent Vitals/Pain Pain Assessment: 0-10 Pain Score: 8  Pain Location: back, bilateral feet Pain Descriptors / Indicators: Discomfort Pain Intervention(s): Limited activity within patient's tolerance;Monitored during session;Repositioned    Home Living  Prior Function            PT Goals (current goals can now be found in the care plan section) Acute Rehab PT Goals Patient Stated Goal: Per son, to get to post-acute rehab PT Goal Formulation: With patient/family Time For Goal Achievement: 10/25/20 Potential to Achieve Goals: Good Progress towards PT goals: Progressing toward goals    Frequency    Min 5X/week      PT Plan Current plan remains  appropriate;Discharge plan needs to be updated    Co-evaluation              AM-PAC PT "6 Clicks" Mobility   Outcome Measure  Help needed turning from your back to your side while in a flat bed without using bedrails?: A Little Help needed moving from lying on your back to sitting on the side of a flat bed without using bedrails?: A Little Help needed moving to and from a bed to a chair (including a wheelchair)?: A Little Help needed standing up from a chair using your arms (Carey.g., wheelchair or bedside chair)?: A Little Help needed to walk in hospital room?: A Little Help needed climbing 3-5 steps with a railing? : A Lot 6 Click Score: 17    End of Session Equipment Utilized During Treatment: Back brace Activity Tolerance: Patient limited by fatigue Patient left: in chair;with call bell/phone within reach;with family/visitor present (family at bedside, will not let pt get up without RN staff) Nurse Communication: Mobility status PT Visit Diagnosis: Other abnormalities of gait and mobility (R26.89);Muscle weakness (generalized) (M62.81);Unsteadiness on feet (R26.81)     Time: 8177-1165 PT Time Calculation (min) (ACUTE ONLY): 28 min  Charges:  $Gait Training: 8-22 mins $Therapeutic Activity: 8-22 mins                    Dennis Carey, PT Acute Rehabilitation Services Pager [phone]  Office [phone]    Dennis Carey Dennis Carey 10/12/2020, 4:51 PM

## 2020-10-12 NOTE — Progress Notes (Signed)
Occupational Therapy Treatment Patient Details Name: Dennis Carey MRN: [id number] DOB: 07/30/1935 Today's Date: 10/12/2020    History of present illness The pt is an 85 yo male s/p PLIF L5-S1 on 11/19, return to [facility] on 12/1 for failure to thrive, severe pain. The pt has had multiple prior lumbar surgeries in June and July of this year. Other PMH includes; CAD, CKD III, HTN, and HLD.   OT comments  Pt more alert today and seated in recliner without TLSO donned upon arrival. Assisted pt donning TLSO and pt able to stand from recliner to RW, ambulate to bathroom for toileting tasks and tasks standing a sink. OT will continue to follow acutely to maximize level of function and safety  Follow Up Recommendations  Home health OT;Other (comment) (CIR denied)    Equipment Recommendations  3 in 1 bedside commode;Other (comment) (reacher, LH bath sponge)    Recommendations for Other Services      Precautions / Restrictions Precautions Precautions: Fall;Back Precaution Comments: reviewed back precautions: BLT Required Braces or Orthoses: Spinal Brace Spinal Brace: Lumbar corset;Applied in sitting position Restrictions Weight Bearing Restrictions: No       Mobility Bed Mobility               General bed mobility comments: pt up in recliner upon arrival  Transfers Overall transfer level: Needs assistance Equipment used: Rolling walker (2 wheeled) Transfers: Sit to/from Stand Sit to Stand: Min assist;Min guard Stand pivot transfers: Min guard       General transfer comment: max verbal cues for maintaining back precautions    Balance Overall balance assessment: Needs assistance Sitting-balance support: Feet supported;No upper extremity supported Sitting balance-Leahy Scale: Fair     Standing balance support: Single extremity supported;During functional activity Standing balance-Leahy Scale: Poor                             ADL either performed or assessed  with clinical judgement   ADL Overall ADL's : Needs assistance/impaired     Grooming: Wash/dry hands;Wash/dry face;Min guard;Standing;Cueing for safety       Lower Body Bathing: Minimal assistance;Sitting/lateral leans;Sit to/from stand;With caregiver independent assisting       Lower Body Dressing: Minimal assistance;Cueing for safety;Sitting/lateral leans;Sit to/from stand;With caregiver independent assisting   Toilet Transfer: Min guard;Ambulation;Cueing for safety;RW;BSC   Toileting- Clothing Manipulation and Hygiene: Sitting/lateral lean;Sit to/from stand;Min guard;Cueing for safety Toileting - Clothing Manipulation Details (indicate cue type and reason): total A with hygiene     Functional mobility during ADLs: Min guard;Cueing for safety;Rolling walker General ADL Comments: Pt stood at [facility] for posterior hygiene total A     Vision       Perception     Praxis      Cognition Arousal/Alertness: Awake/alert Behavior During Therapy: WFL for tasks assessed/performed Overall Cognitive Status: Within Functional Limits for tasks assessed                                          Exercises     Shoulder Instructions       General Comments  reviewed back precautions with pt    Pertinent Vitals/ Pain       Pain Assessment: 0-10 Pain Score: 8  Pain Location: back Pain Descriptors / Indicators: Discomfort Pain Intervention(s): Limited activity within patient's tolerance;Monitored during session;Repositioned;Relaxation  Home Living                                          Prior Functioning/Environment              Frequency  Min 2X/week        Progress Toward Goals  OT Goals(current goals can now be found in the care plan section)  Progress towards OT goals: Progressing toward goals  Acute Rehab OT Goals Patient Stated Goal: go home  Plan Discharge plan remains appropriate    Co-evaluation                  AM-PAC OT "6 Clicks" Daily Activity     Outcome Measure   Help from another person eating meals?: None Help from another person taking care of personal grooming?: A Little Help from another person toileting, which includes using toliet, bedpan, or urinal?: A Little Help from another person bathing (including washing, rinsing, drying)?: A Little Help from another person to put on and taking off regular upper body clothing?: None Help from another person to put on and taking off regular lower body clothing?: A Little 6 Click Score: 20    End of Session Equipment Utilized During Treatment: Back brace;Gait belt;Rolling walker;Other (comment) (BSC)  OT Visit Diagnosis: Unsteadiness on feet (R26.81);Muscle weakness (generalized) (M62.81);Pain Pain - Right/Left:  (back) Pain - part of body:  (back)   Activity Tolerance Patient tolerated treatment well   Patient Left in chair;with call bell/phone within reach;with nursing/sitter in room;with family/visitor present   Nurse Communication          Time: 5427-0623 OT Time Calculation (min): 33 min  Charges: OT General Charges $OT Visit: 1 Visit OT Treatments $Self Care/Home Management : 8-22 mins $Therapeutic Activity: 8-22 mins    Britt Bottom 10/12/2020, 2:49 PM

## 2020-10-12 NOTE — Progress Notes (Signed)
Oversedation/lethargy has resolved.  Patient currently sitting upright in the chair bright and awake.  He remains somewhat confused but his speech is fluent.  He has no focal deficits.  His back pain is well controlled.  He does have bilateral distal leg and foot pain.  He has no weakness.  He is able to stand and walk with assistance.  He has been voiding well.  He is afebrile.  His vital signs are stable.  Chest x-ray last night showed a little bit of atelectasis on the left side but no infiltrates or other more serious problems.  His wound is clean and dry.  His chest and abdomen are benign.  Lower extremity edema is improved.  Motor examination intact bilateral.  Sensory examination with some hyperesthesia in both distal L5 dermatomes.  Problem with oversedation/narcotic overdosage appears to have resolved.  Patient still with some mild confusion likely secondary to stress/exhaustion and some lingering medication effects.  Patient currently only receiving Ultram for pain.  This does seem to be controlling his pain reasonably well.  I would like to work on mobilizing him more with therapy.  We do await evaluation from rehab.  Discharge plan is up in the air at this point versus home with home health therapy, inpatient rehabilitation for a brief convalesce of rehab program versus skilled nursing facility placement.  Hopefully over the next few days the patient's pain level and mobility issues will allow [place] better to determine the most appropriate course for him.

## 2020-10-12 NOTE — Progress Notes (Signed)
Inpatient Rehab Admissions:  Inpatient Rehab Consult received.  Discussed case with medical director, Dr. Naaman Plummer.  Pt does not appear to have the medical necessity to support an acute inpatient rehab admission.  Will sign off at this time.    Signed: Shann Medal, PT, DPT Admissions Coordinator [phone] 10/12/20  1:10 PM

## 2020-10-12 NOTE — Care Management Important Message (Signed)
Important Message  Patient Details  Name: Dennis Carey MRN: [id number] Date of Birth: 1935-06-14   Medicare Important Message Given:  Yes  Due to staffing called the patient room spoke with son Gershon Mussel to advise of the IM and that I would send a copy to home address.   Shardea Cwynar 10/12/2020, 1:14 PM

## 2020-10-12 NOTE — Progress Notes (Signed)
Patient complaining of severe left lower extremity pain 10/10, Tramadol given 50 mg with little effect, paged Dr. Annette Stable with new order.

## 2020-10-12 NOTE — Progress Notes (Signed)
Bilateral lower extremity venous study completed.      Please see CV Proc for preliminary results.   Yamilka Lopiccolo, RVT  

## 2020-10-13 ENCOUNTER — Inpatient Hospital Stay (HOSPITAL_COMMUNITY): Payer: Medicare Other

## 2020-10-13 DIAGNOSIS — J189 Pneumonia, unspecified organism: Secondary | ICD-10-CM | POA: Diagnosis not present

## 2020-10-13 DIAGNOSIS — I248 Other forms of acute ischemic heart disease: Secondary | ICD-10-CM

## 2020-10-13 DIAGNOSIS — J9601 Acute respiratory failure with hypoxia: Secondary | ICD-10-CM | POA: Diagnosis not present

## 2020-10-13 LAB — BLOOD GAS, ARTERIAL
Acid-base deficit: 7.6 mmol/L — ABNORMAL HIGH (ref 0.0–2.0)
Bicarbonate: 18.7 mmol/L — ABNORMAL LOW (ref 20.0–28.0)
FIO2: 100
O2 Saturation: 85.7 %
Patient temperature: 37
pCO2 arterial: 46.8 mmHg (ref 32.0–48.0)
pH, Arterial: 7.226 — ABNORMAL LOW (ref 7.350–7.450)
pO2, Arterial: 60.2 mmHg — ABNORMAL LOW (ref 83.0–108.0)

## 2020-10-13 LAB — CBC
HCT: 39 % (ref 39.0–52.0)
HCT: 36.6 % — ABNORMAL LOW (ref 39.0–52.0)
Hemoglobin: 11.7 g/dL — ABNORMAL LOW (ref 13.0–17.0)
Hemoglobin: 11.7 g/dL — ABNORMAL LOW (ref 13.0–17.0)
MCH: 29.5 pg (ref 26.0–34.0)
MCH: 30 pg (ref 26.0–34.0)
MCHC: 30 g/dL (ref 30.0–36.0)
MCHC: 32 g/dL (ref 30.0–36.0)
MCV: 98.2 fL (ref 80.0–100.0)
MCV: 93.8 fL (ref 80.0–100.0)
Platelets: 349 10*3/uL (ref 150–400)
Platelets: 354 10*3/uL (ref 150–400)
RBC: 3.97 MIL/uL — ABNORMAL LOW (ref 4.22–5.81)
RBC: 3.9 MIL/uL — ABNORMAL LOW (ref 4.22–5.81)
RDW: 12.2 % (ref 11.5–15.5)
RDW: 12.5 % (ref 11.5–15.5)
WBC: 16.7 10*3/uL — ABNORMAL HIGH (ref 4.0–10.5)
WBC: 7.6 10*3/uL (ref 4.0–10.5)
nRBC: 0 % (ref 0.0–0.2)
nRBC: 0 % (ref 0.0–0.2)

## 2020-10-13 LAB — ECHOCARDIOGRAM COMPLETE
Area-P 1/2: 3.19 cm2
Calc EF: 27.2 %
Height: 67 in
S' Lateral: 3.4 cm
Single Plane A2C EF: 25.3 %
Single Plane A4C EF: 26.5 %
Weight: 2480.02 oz

## 2020-10-13 LAB — MRSA PCR SCREENING: MRSA by PCR: NEGATIVE

## 2020-10-13 LAB — TROPONIN I (HIGH SENSITIVITY)
Troponin I (High Sensitivity): 147 ng/L (ref <18)
Troponin I (High Sensitivity): 1057 ng/L (ref <18)
Troponin I (High Sensitivity): 1033 ng/L (ref <18)

## 2020-10-13 LAB — POCT I-STAT 7, (LYTES, BLD GAS, ICA,H+H)
Acid-base deficit: 9 mmol/L — ABNORMAL HIGH (ref 0.0–2.0)
Bicarbonate: 17.5 mmol/L — ABNORMAL LOW (ref 20.0–28.0)
Calcium, Ion: 1.09 mmol/L — ABNORMAL LOW (ref 1.15–1.40)
HCT: 35 % — ABNORMAL LOW (ref 39.0–52.0)
Hemoglobin: 11.9 g/dL — ABNORMAL LOW (ref 13.0–17.0)
O2 Saturation: 74 %
Patient temperature: 98.5
Potassium: 4.8 mmol/L (ref 3.5–5.1)
Sodium: 140 mmol/L (ref 135–145)
TCO2: 19 mmol/L — ABNORMAL LOW (ref 22–32)
pCO2 arterial: 38.8 mmHg (ref 32.0–48.0)
pH, Arterial: 7.263 — ABNORMAL LOW (ref 7.350–7.450)
pO2, Arterial: 44 mmHg — ABNORMAL LOW (ref 83.0–108.0)

## 2020-10-13 LAB — COMPREHENSIVE METABOLIC PANEL
ALT: 43 U/L (ref 0–44)
AST: 56 U/L — ABNORMAL HIGH (ref 15–41)
Albumin: 2.9 g/dL — ABNORMAL LOW (ref 3.5–5.0)
Alkaline Phosphatase: 81 U/L (ref 38–126)
Anion gap: 12 (ref 5–15)
BUN: 41 mg/dL — ABNORMAL HIGH (ref 8–23)
CO2: 18 mmol/L — ABNORMAL LOW (ref 22–32)
Calcium: 7.5 mg/dL — ABNORMAL LOW (ref 8.9–10.3)
Chloride: 111 mmol/L (ref 98–111)
Creatinine, Ser: 1.48 mg/dL — ABNORMAL HIGH (ref 0.61–1.24)
GFR, Estimated: 47 mL/min — ABNORMAL LOW (ref >60)
Glucose, Bld: 259 mg/dL — ABNORMAL HIGH (ref 70–99)
Potassium: 4.6 mmol/L (ref 3.5–5.1)
Sodium: 141 mmol/L (ref 135–145)
Total Bilirubin: 0.7 mg/dL (ref 0.3–1.2)
Total Protein: 5.9 g/dL — ABNORMAL LOW (ref 6.5–8.1)

## 2020-10-13 LAB — BRAIN NATRIURETIC PEPTIDE: B Natriuretic Peptide: 589 pg/mL — ABNORMAL HIGH (ref 0.0–100.0)

## 2020-10-13 LAB — LACTIC ACID, PLASMA
Lactic Acid, Venous: 4.1 mmol/L (ref 0.5–1.9)
Lactic Acid, Venous: 3.6 mmol/L (ref 0.5–1.9)

## 2020-10-13 LAB — GLUCOSE, CAPILLARY
Glucose-Capillary: 166 mg/dL — ABNORMAL HIGH (ref 70–99)
Glucose-Capillary: 152 mg/dL — ABNORMAL HIGH (ref 70–99)
Glucose-Capillary: 215 mg/dL — ABNORMAL HIGH (ref 70–99)
Glucose-Capillary: 177 mg/dL — ABNORMAL HIGH (ref 70–99)

## 2020-10-13 LAB — PROCALCITONIN: Procalcitonin: <0.1 ng/mL

## 2020-10-13 LAB — D-DIMER, QUANTITATIVE: D-Dimer, Quant: 17.05 ug/mL-FEU — ABNORMAL HIGH (ref 0.00–0.50)

## 2020-10-13 MED ORDER — VANCOMYCIN HCL IN DEXTROSE 1-5 GM/200ML-% IV SOLN: 1000.0000 mg | INTRAVENOUS | Status: DC

## 2020-10-13 MED ORDER — FUROSEMIDE 10 MG/ML IJ SOLN: 40.0000 mg | Freq: Once | INTRAMUSCULAR | Status: DC

## 2020-10-13 MED ORDER — SODIUM BICARBONATE 8.4 % IV SOLN
INTRAVENOUS | Status: AC
  Administered 2020-10-13: 100 meq via INTRAVENOUS
  Filled 2020-10-13: qty 100

## 2020-10-13 MED ORDER — HEPARIN (PORCINE) 25000 UT/250ML-% IV SOLN
600.0000 [IU]/h | INTRAVENOUS | Status: DC
  Administered 2020-10-13: 750 [IU]/h via INTRAVENOUS
  Filled 2020-10-13: qty 250

## 2020-10-13 MED ORDER — SODIUM BICARBONATE 8.4 % IV SOLN: 100.0000 meq | Freq: Once | INTRAVENOUS | Status: AC

## 2020-10-13 MED ORDER — SODIUM CHLORIDE 0.9 % IV SOLN
2.0000 g | Freq: Once | INTRAVENOUS | Status: AC
  Administered 2020-10-13: 2 g via INTRAVENOUS
  Filled 2020-10-13: qty 2

## 2020-10-13 MED ORDER — DEXMEDETOMIDINE HCL IN NACL 400 MCG/100ML IV SOLN
0.4000 ug/kg/h | INTRAVENOUS | Status: DC
  Administered 2020-10-13: 0.4 ug/kg/h via INTRAVENOUS
  Administered 2020-10-14: 0.6 ug/kg/h via INTRAVENOUS
  Administered 2020-10-14: 0.5 ug/kg/h via INTRAVENOUS
  Administered 2020-10-15: 0.9 ug/kg/h via INTRAVENOUS
  Administered 2020-10-15 – 2020-10-17 (×4): 0.7 ug/kg/h via INTRAVENOUS
  Administered 2020-10-17: 0.9 ug/kg/h via INTRAVENOUS
  Administered 2020-10-17 (×2): 0.7 ug/kg/h via INTRAVENOUS
  Administered 2020-10-18: 0.8 ug/kg/h via INTRAVENOUS
  Administered 2020-10-18 – 2020-10-19 (×2): 0.9 ug/kg/h via INTRAVENOUS
  Filled 2020-10-13 (×16): qty 100

## 2020-10-13 MED ORDER — ROCURONIUM BROMIDE 50 MG/5ML IV SOLN
50.0000 mg | Freq: Once | INTRAVENOUS | Status: AC
  Administered 2020-10-13: 50 mg via INTRAVENOUS
  Filled 2020-10-13: qty 5

## 2020-10-13 MED ORDER — ORAL CARE MOUTH RINSE
15.0000 mL | OROMUCOSAL | Status: DC
  Administered 2020-10-13 – 2020-10-14 (×6): 15 mL via OROMUCOSAL

## 2020-10-13 MED ORDER — STERILE WATER FOR INJECTION IV SOLN
INTRAVENOUS | Status: DC
  Filled 2020-10-13 (×3): qty 850

## 2020-10-13 MED ORDER — ASPIRIN 81 MG PO CHEW
81.0000 mg | CHEWABLE_TABLET | Freq: Every day | ORAL | Status: DC
  Administered 2020-10-14 – 2020-10-27 (×14): 81 mg
  Filled 2020-10-13 (×14): qty 1

## 2020-10-13 MED ORDER — CHLORHEXIDINE GLUCONATE 0.12% ORAL RINSE (MEDLINE KIT)
15.0000 mL | Freq: Two times a day (BID) | OROMUCOSAL | Status: DC
  Administered 2020-10-13 – 2020-10-14 (×2): 15 mL via OROMUCOSAL

## 2020-10-13 MED ORDER — ROCURONIUM BROMIDE 10 MG/ML (PF) SYRINGE
PREFILLED_SYRINGE | INTRAVENOUS | Status: AC
  Administered 2020-10-13: 100 mg
  Filled 2020-10-13: qty 10

## 2020-10-13 MED ORDER — NOREPINEPHRINE 4 MG/250ML-% IV SOLN
0.0000 ug/min | INTRAVENOUS | Status: DC
  Administered 2020-10-14 (×2): 4 ug/min via INTRAVENOUS
  Filled 2020-10-13 (×3): qty 250

## 2020-10-13 MED ORDER — FENTANYL 2500MCG IN NS 250ML (10MCG/ML) PREMIX INFUSION
0.0000 ug/h | INTRAVENOUS | Status: DC
  Administered 2020-10-13: 50 ug/h via INTRAVENOUS
  Administered 2020-10-15: 225 ug/h via INTRAVENOUS
  Administered 2020-10-15: 150 ug/h via INTRAVENOUS
  Administered 2020-10-16: 200 ug/h via INTRAVENOUS
  Administered 2020-10-17: 150 ug/h via INTRAVENOUS
  Administered 2020-10-18: 100 ug/h via INTRAVENOUS
  Administered 2020-10-18: 150 ug/h via INTRAVENOUS
  Filled 2020-10-13 (×9): qty 250

## 2020-10-13 MED ORDER — SODIUM BICARBONATE 8.4 % IV SOLN
INTRAVENOUS | Status: AC
  Administered 2020-10-13: 50 meq via INTRAVENOUS
  Filled 2020-10-13: qty 50

## 2020-10-13 MED ORDER — FENTANYL CITRATE (PF) 100 MCG/2ML IJ SOLN
50.0000 ug | Freq: Once | INTRAMUSCULAR | Status: AC
  Administered 2020-10-13: 100 ug via INTRAVENOUS

## 2020-10-13 MED ORDER — FUROSEMIDE 10 MG/ML IJ SOLN
40.0000 mg | Freq: Once | INTRAMUSCULAR | Status: AC
  Administered 2020-10-13: 40 mg via INTRAVENOUS

## 2020-10-13 MED ORDER — MIDAZOLAM HCL 2 MG/2ML IJ SOLN
2.0000 mg | Freq: Once | INTRAMUSCULAR | Status: AC
  Administered 2020-10-13: 2 mg via INTRAVENOUS

## 2020-10-13 MED ORDER — VANCOMYCIN HCL 1500 MG/300ML IV SOLN
1500.0000 mg | Freq: Once | INTRAVENOUS | Status: AC
  Administered 2020-10-13: 1500 mg via INTRAVENOUS
  Filled 2020-10-13: qty 300

## 2020-10-13 MED ORDER — SODIUM BICARBONATE 8.4 % IV SOLN: 50.0000 meq | Freq: Once | INTRAVENOUS | Status: AC

## 2020-10-13 MED ORDER — SODIUM CHLORIDE 0.9 % IV SOLN: 2.0000 g | Freq: Two times a day (BID) | INTRAVENOUS | Status: DC

## 2020-10-13 MED ORDER — CHLORHEXIDINE GLUCONATE CLOTH 2 % EX PADS
6.0000 | MEDICATED_PAD | Freq: Every day | CUTANEOUS | Status: DC
  Administered 2020-10-13 – 2020-10-29 (×17): 6 via TOPICAL

## 2020-10-13 MED ORDER — ROCURONIUM BROMIDE 50 MG/5ML IV SOLN: 100.0000 mg | Freq: Once | INTRAVENOUS | Status: AC

## 2020-10-13 MED ORDER — FUROSEMIDE 10 MG/ML IJ SOLN
40.0000 mg | Freq: Three times a day (TID) | INTRAMUSCULAR | Status: DC
  Administered 2020-10-13 – 2020-10-14 (×2): 40 mg via INTRAVENOUS
  Filled 2020-10-13 (×2): qty 4

## 2020-10-13 MED ORDER — SODIUM CHLORIDE 0.9 % IV SOLN
2.0000 g | Freq: Two times a day (BID) | INTRAVENOUS | Status: DC
  Administered 2020-10-14 – 2020-10-18 (×10): 2 g via INTRAVENOUS
  Filled 2020-10-13 (×10): qty 2

## 2020-10-13 MED ORDER — FUROSEMIDE 10 MG/ML IJ SOLN
INTRAMUSCULAR | Status: AC
  Filled 2020-10-13: qty 4

## 2020-10-13 MED ORDER — ETOMIDATE 2 MG/ML IV SOLN
20.0000 mg | Freq: Once | INTRAVENOUS | Status: AC
  Administered 2020-10-13: 20 mg via INTRAVENOUS
  Filled 2020-10-13: qty 10

## 2020-10-13 NOTE — Consult Note (Addendum)
CARDIOLOGY CONSULT NOTE  Patient ID: [facility] Carey MRN: [id number] DOB/AGE: 1935/05/12 85 y.o.  Admit date: 11/04/2020 Referring Physician  [facility] Primary Physician:  [facility] Battles, MD Reason for Consultation  NSTEMI, Acute respiratory failure  Patient ID: [facility] Carey, male    DOB: [birth year], 85 y.o.   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 Caucasian male with hyperlipidemia, coronary calcification noted on the CT scan, chronic back pain s/p lumbar laminectomy on 04/20/2020, had been having chronic back pain and underwent uncomplicated R1-H6 laminectomy and fusion on 09/28/2020 and discharged home, but readmitted to the hospital with worsening pain under neurosurgery.   Yesterday afternoon patient became acutely short of breath and desaturated, needing intubation.  At that time he was found to have new EKG abnormality and serum troponin was elevated, echocardiogram was also abnormal with wall motion abnormality hence I was consulted.  This morning patient remains intubated.  Wife and daughter present at the bedside.  Due to hypoxemia and continued respiratory distress he also needed bronchoscopy at the bedside last night at 12 AM did not reveal any significant abnormality.  He remains intubated, presently on norepinephrine.  Past Medical History:  Diagnosis Date  . Anxiety   . Back pain   . CAD (coronary artery disease)    significant CAD by cardiac calcium scoring test  . CKD (chronic kidney disease), stage III ([place])    by serum creatinine is stable  . HTN (hypertension)    pt denies   . Hyperlipidemia   . Microhematuria   . PSA elevation 2014   4.75 February 2017, 6.9 in 2/18 and declines urologist eval or trial of antibiotics   Past Surgical History:  Procedure Laterality Date  . dental implants     3 of them 2008  . LUMBAR LAMINECTOMY/DECOMPRESSION MICRODISCECTOMY Right 04/20/2020   Procedure:  Laminectomy and Foraminotomy - right - Lumbar five-Sacral one;  Surgeon: [place] Larsson, MD;  Location: [facility];  Service: Neurosurgery;  Laterality: Right;  . LUMBAR LAMINECTOMY/DECOMPRESSION MICRODISCECTOMY Right 06/08/2020   Procedure: Right Lumbar Two-Three Laminectomy and Foraminotomy;  Surgeon: [place] Larsson, MD;  Location: [facility];  Service: Neurosurgery;  Laterality: Right;  . submucous resection nose     1956   Social History   Tobacco Use  . Smoking status: Never Smoker  . Smokeless tobacco: Never Used  Substance Use Topics  . Alcohol use: Yes    Comment: occasional wine     Family History  Problem Relation Age of Onset  . Colon cancer Neg Hx   . Migraines Neg Hx     Marital Status: Married  ROS  Review of Systems  Unable to perform ROS: acuity of condition (Sedated and intubated)   Objective   Vitals with BMI 10/14/2020 10/14/2020 10/14/2020  Height - - -  Weight - - -  BMI - - -  Systolic 579 038 333  Diastolic 69 67 65  Pulse 71 72 73    Blood pressure 124/69, pulse 71, temperature 98.6 F (37 C), temperature source Oral, resp. rate 20, height _0  (1.702 m), weight 70.3 kg, SpO2 98 %.   Vitals with BMI 10/14/2020 10/14/2020 10/14/2020  Height - - -  Weight - - -  BMI - - -  Systolic 832 919 166  Diastolic 70 69 67  Pulse 69 71 72      Physical Exam Constitutional:  Appearance: Normal appearance.     Interventions: He is sedated and intubated.  Cardiovascular:     Rate and Rhythm: Normal rate and regular rhythm.     Pulses: Normal pulses and intact distal pulses.     Heart sounds: Normal heart sounds. No murmur heard.  No gallop.      Comments: No leg edema, no JVD. Pulmonary:     Effort: Pulmonary effort is normal. He is intubated.     Breath sounds: Normal breath sounds.  Abdominal:     General: There is no distension.     Palpations: Abdomen is soft.     Tenderness: There is no guarding.     Comments: Sluggish bowel sounds.  Musculoskeletal:         General: No swelling or deformity.  Skin:    General: Skin is warm.     Capillary Refill: Capillary refill takes less than 2 seconds.  Neurological:     Comments: Sedated and intubated.    Laboratory examination:   Recent Labs    04/19/20 1733 04/19/20 1733 06/08/20 0647 09/28/20 1102 10/19/2020 1759 10/19/2020 1759 10/13/20 1513 10/13/20 1513 10/13/20 2233 10/14/20 0306 10/14/20 0345  NA 138   < > 136   < > 139   < > 141   < > 140 143 142  K 4.4   < > 4.3   < > 4.0   < > 4.6   < > 4.8 4.2 4.2  CL 107   < > 101   < > 103  --  111  --   --   --  105  CO2 21*   < > 26   < > 24  --  18*  --   --   --  22  GLUCOSE 185*   < > 201*   < > 148*  --  259*  --   --   --  221*  BUN 16   < > 25*   < > 22  --  41*  --   --   --  46*  CREATININE 1.50*   < > 1.31*   < > 1.32*  --  1.48*  --   --   --  1.81*  CALCIUM 9.0   < > 8.9   < > 8.5*  --  7.5*  --   --   --  7.0*  GFRNONAA 43*   < > 50*   < > 54*  --  47*  --   --   --  37*  GFRAA 50*  --  58*  --   --   --   --   --   --   --   --    < > = values in this interval not displayed.   estimated creatinine clearance is 28.9 mL/min (A) (by C-G formula based on SCr of 1.81 mg/dL (H)).  CMP Latest Ref Rng & Units 10/14/2020 10/14/2020 10/13/2020  Glucose 70 - 99 mg/dL 221(H) - -  BUN 8 - 23 mg/dL 46(H) - -  Creatinine 0.61 - 1.24 mg/dL 1.81(H) - -  Sodium 135 - 145 mmol/L 142 143 140  Potassium 3.5 - 5.1 mmol/L 4.2 4.2 4.8  Chloride 98 - 111 mmol/L 105 - -  CO2 22 - 32 mmol/L 22 - -  Calcium 8.9 - 10.3 mg/dL 7.0(L) - -  Total Protein 6.5 - 8.1 g/dL 5.0(L) - -  Total Bilirubin 0.3 -  1.2 mg/dL 1.3(H) - -  Alkaline Phos 38 - 126 U/L 62 - -  AST 15 - 41 U/L 43(H) - -  ALT 0 - 44 U/L 38 - -   CBC Latest Ref Rng & Units 10/14/2020 10/14/2020 10/13/2020  WBC 4.0 - 10.5 K/uL 10.3 - 7.6  Hemoglobin 13.0 - 17.0 g/dL 10.6(L) 10.5(L) 11.7(L)  Hematocrit 39 - 52 % 31.9(L) 31.0(L) 36.6(L)  Platelets 150 - 400 K/uL 319 - 354   Lipid Panel No  results for input(s): CHOL, TRIG, LDLCALC, VLDL, HDL, CHOLHDL, LDLDIRECT in the last 8760 hours.  HEMOGLOBIN A1C Lab Results  Component Value Date   HGBA1C 7.7 (H) 10/12/2020   MPG 174.29 10/12/2020   TSH No results for input(s): TSH in the last 8760 hours. BNP (last 3 results) Recent Labs    10/13/20 1513  BNP 589.0*    Ref Range & Units 1 d ago  (10/13/20) 1 d ago  (10/13/20) 1 d ago  (10/13/20)  Troponin I (High Sensitivity) <18 ng/L 1,033High Panic  1,057High Panic CM  147High Panic CM        D-Dimer  Ref Range & Units 1 d ago 10/13/2020  D-Dimer, Quant 0.00 - 0.50 ug/mL-FEU 17.05High   Comment: (NOTE)         Medications and allergies   Allergies  Allergen Reactions  . Sulfa Antibiotics Hives     Current Meds  Medication Sig  . aspirin 81 MG tablet Take 81 mg by mouth daily.  . diazepam (VALIUM) 5 MG tablet Take 1 tablet (5 mg total) by mouth every 6 (six) hours as needed for muscle spasms. (Patient taking differently: Take 2.5 mg by mouth every 6 (six) hours. )  . docusate sodium (COLACE) 100 MG capsule Take 100 mg by mouth 3 (three) times daily with meals.   Marland Kitchen ezetimibe (ZETIA) 10 MG tablet Take 10 mg by mouth at bedtime.   Marland Kitchen HYDROcodone-acetaminophen (NORCO/VICODIN) 5-325 MG tablet Take 1 tablet by mouth every 4 (four) hours.   . polyethylene glycol powder (GLYCOLAX/MIRALAX) 17 GM/SCOOP powder Take 17 g by mouth See admin instructions. Mix 17 grams of powder into 4 ounces of water and drink every morning  . pregabalin (LYRICA) 75 MG capsule Take 75 mg by mouth 2 (two) times daily.  . rosuvastatin (CRESTOR) 20 MG tablet Take 20 mg by mouth at bedtime.     Scheduled Meds: . aspirin  81 mg Per Tube Daily  . chlorhexidine gluconate (MEDLINE KIT)  15 mL Mouth Rinse BID  . Chlorhexidine Gluconate Cloth  6 each Topical Daily  . furosemide  40 mg Intravenous Q8H  . mouth rinse  15 mL Mouth Rinse 10 times per day  . polyethylene glycol  17 g Per Tube Daily    Continuous Infusions: . sodium chloride    . ceFEPime (MAXIPIME) IV 2 g (10/14/20 0914)  . dexmedetomidine (PRECEDEX) IV infusion 0.4 mcg/kg/hr (10/14/20 0914)  . fentaNYL infusion INTRAVENOUS 100 mcg/hr (10/14/20 0914)  . heparin 600 Units/hr (10/14/20 0914)  . norepinephrine (LEVOPHED) Adult infusion 6 mcg/min (10/14/20 0914)  .  sodium bicarbonate (isotonic) infusion in sterile water 100 mL/hr at 10/14/20 0914  . vancomycin     PRN Meds:.Place/Maintain arterial line **AND** sodium chloride, acetaminophen (TYLENOL) oral liquid 160 mg/5 mL, ondansetron **OR** ondansetron (ZOFRAN) IV   I/O last 3 completed shifts: In: 3957.6 [I.V.:3474.8; IV Piggyback:482.8] Out: 1860 [Urine:1860] Total I/O In: 495.4 [I.V.:495.4] Out: -     Radiology:  DG Chest 1 View  Result Date: 10/13/2020 CLINICAL DATA:  Hypoxemia EXAM: CHEST  1 VIEW COMPARISON:  Radiograph 10/13/2020 FINDINGS: Endotracheal tube terminates in the mid trachea, 5.5 cm from the carina. Transesophageal tube tip and side port are distal to the GE junction, terminating below the margins of imaging. Telemetry leads and external support devices overlie the chest. Redemonstration of the heterogeneous opacity in both lungs,, right greater than left with some slight interval clearing in the right mid lung, possibly related to improving lung volumes when compared to prior study. No visible pneumothorax or effusion. The aorta is calcified. The remaining cardiomediastinal contours are unremarkable. No acute osseous or soft tissue abnormality. IMPRESSION: 1. Persistent bilateral, opacities, right greater than left. Slight interval clearing in the right mid lung, possibly related to improving lung volumes when compared to prior study. 2. Support devices as above. Electronically Signed   By: Lovena Le M.D.   On: 10/13/2020 23:00   DG Abd 1 View  Result Date: 10/13/2020 CLINICAL DATA:  OG tube placement EXAM: ABDOMEN - 1 VIEW COMPARISON:   Radiograph 10/13/2020 FINDINGS: Transesophageal tube tip terminates near the gastric antrum/duodenal bulb. Side port in the distal gastric body, beyond the GE junction. Paucity of abdominal bowel gas is nonspecific without evidence of high-grade bowel obstruction. Multifocal patchy opacities are present in the lung bases, more coalescent in the right lower lung. Trace effusions. Cardiomediastinal contours are stable from prior. Degenerative changes present in the spine. Lower lumbar fusion hardware partially visualized. IMPRESSION: 1. Transesophageal tube tip terminates near the gastric antrum/duodenal bulb. Side port in the distal gastric body, beyond the GE junction. 2. Multifocal pneumonia. Electronically Signed   By: Lovena Le M.D.   On: 10/13/2020 17:37   DG CHEST PORT 1 VIEW  Result Date: 10/13/2020 CLINICAL DATA:  Follow-up lung infiltrates. Endotracheal tube and nasal/orogastric tube placement. EXAM: PORTABLE CHEST 1 VIEW COMPARISON:  10/13/2020 at 1:47 p.m. FINDINGS: Endotracheal tube tip lies at the carina, but does not extend towards either mainstem bronchus. Consider retracting 1-2 cm. Nasal/orogastric tube passes well below the diaphragm into the stomach. Bilateral airspace lung opacities are without significant change from the earlier exam allowing for differences in radiographic technique. IMPRESSION: 1. Endotracheal tube tip projects at the carinal. Consider retracting 1-2 cm for more optimal positioning. 2. Well-positioned nasal/orogastric tube. 3. No change in the bilateral airspace lung opacities consistent with multifocal pneumonia. Electronically Signed   By: Lajean Manes M.D.   On: 10/13/2020 15:50   DG CHEST PORT 1 VIEW  Result Date: 10/13/2020 CLINICAL DATA:  Oxygen desaturation EXAM: PORTABLE CHEST 1 VIEW COMPARISON:  10/11/2020 FINDINGS: Cardiac shadow is stable. The lungs are well aerated bilaterally. Airspace opacities are noted right considerably greater than left consistent  with multifocal pneumonia. No bony abnormality is seen. No other focal abnormality is noted. IMPRESSION: Bilateral airspace opacities consistent with multifocal pneumonia. Electronically Signed   By: Inez Catalina M.D.   On: 10/13/2020 13:55   ECHOCARDIOGRAM COMPLETE  Result Date: 10/13/2020    ECHOCARDIOGRAM REPORT   Patient Name:   PADDY NEIS Date of Exam: 10/13/2020 Medical Rec #:  [id number]      Height:       67.0 in Accession #:    [id number]     Weight:       155.0 lb Date of Birth:  02/05/1935     BSA:          1.815 m Patient Age:  85 years       BP:           133/73 mmHg Patient Gender: M              HR:           102 bpm. Exam Location:  Inpatient Procedure: 2D Echo STAT ECHO Indications:    acute coronary syndrome  History:        Patient has no prior history of Echocardiogram examinations.  Sonographer:    Johny Chess Referring Phys: [place] Comments: Echo performed with patient supine and on artificial respirator. Image acquisition challenging due to uncooperative patient. IMPRESSIONS  1. Left ventricular ejection fraction, by estimation, is 25 to 30%. The left ventricle has severely decreased function. The left ventricle demonstrates regional wall motion abnormalities (see scoring diagram/findings for description). Left ventricular diastolic parameters are indeterminate.  2. Right ventricular systolic function is normal. The right ventricular size is normal. There is normal pulmonary artery systolic pressure.  3. The mitral valve is grossly normal. Trivial mitral valve regurgitation. No evidence of mitral stenosis.  4. The aortic valve is grossly normal. There is mild calcification of the aortic valve. Aortic valve regurgitation is not visualized. No aortic stenosis is present.  5. The inferior vena cava is normal in size with <50% respiratory variability, suggesting right atrial pressure of 8 mmHg. Comparison(s): No prior Echocardiogram.  Conclusion(s)/Recommendation(s): Reduced EF with focal wall motion abnormalities. Communicated findings with Drs. Pool, Yates, and Icard. FINDINGS  Left Ventricle: Left ventricular ejection fraction, by estimation, is 25 to 30%. The left ventricle has severely decreased function. The left ventricle demonstrates regional wall motion abnormalities. The left ventricular internal cavity size was normal  in size. There is no left ventricular hypertrophy. Left ventricular diastolic parameters are indeterminate.  LV Wall Scoring: The entire septum is akinetic. The entire anterior wall, entire lateral wall, entire inferior wall, and apex are hypokinetic. Right Ventricle: The right ventricular size is normal. Right vetricular wall thickness was not well visualized. Right ventricular systolic function is normal. There is normal pulmonary artery systolic pressure. The tricuspid regurgitant velocity is 2.48 m/s, and with an assumed right atrial pressure of 8 mmHg, the estimated right ventricular systolic pressure is 02.5 mmHg. Left Atrium: Left atrial size was normal in size. Right Atrium: Right atrial size was normal in size. Pericardium: There is no evidence of pericardial effusion. Mitral Valve: The mitral valve is grossly normal. Trivial mitral valve regurgitation. No evidence of mitral valve stenosis. Tricuspid Valve: The tricuspid valve is grossly normal. Tricuspid valve regurgitation is trivial. Aortic Valve: The aortic valve is grossly normal. There is mild calcification of the aortic valve. Aortic valve regurgitation is not visualized. No aortic stenosis is present. Pulmonic Valve: The pulmonic valve was not well visualized. Pulmonic valve regurgitation is not visualized. No evidence of pulmonic stenosis. Aorta: The aortic root, ascending aorta, aortic arch and descending aorta are all structurally normal, with no evidence of dilitation or obstruction. Venous: The inferior vena cava is normal in size with less than 50%  respiratory variability, suggesting right atrial pressure of 8 mmHg. IAS/Shunts: The atrial septum is grossly normal.  LEFT VENTRICLE PLAX 2D LVIDd:         4.10 cm     Diastology LVIDs:         3.40 cm     LV e' medial:    4.24 cm/s LV PW:  1.10 cm     LV E/e' medial:  10.3 LV IVS:        0.70 cm     LV e' lateral:   5.87 cm/s LVOT diam:     1.80 cm     LV E/e' lateral: 7.4 LV SV:         25 LV SV Index:   14 LVOT Area:     2.54 cm  LV Volumes (MOD) LV vol d, MOD A2C: 46.3 ml LV vol d, MOD A4C: 68.7 ml LV vol s, MOD A2C: 34.6 ml LV vol s, MOD A4C: 50.5 ml LV SV MOD A2C:     11.7 ml LV SV MOD A4C:     68.7 ml LV SV MOD BP:      15.6 ml RIGHT VENTRICLE             IVC RV S prime:     21.80 cm/s  IVC diam: 1.40 cm TAPSE (M-mode): 2.2 cm LEFT ATRIUM             Index LA diam:        2.70 cm 1.49 cm/m LA Vol (A2C):   23.5 ml 12.95 ml/m LA Vol (A4C):   35.7 ml 19.67 ml/m LA Biplane Vol: 30.4 ml 16.75 ml/m  AORTIC VALVE LVOT Vmax:   68.40 cm/s LVOT Vmean:  45.900 cm/s LVOT VTI:    0.097 m  AORTA Ao Root diam: 3.00 cm Ao Asc diam:  3.10 cm MITRAL VALVE               TRICUSPID VALVE MV Area (PHT): 3.19 cm    TR Peak grad:   24.6 mmHg MV Decel Time: 238 msec    TR Vmax:        248.00 cm/s MV E velocity: 43.50 cm/s MV A velocity: 53.80 cm/s  SHUNTS MV E/A ratio:  0.81        Systemic VTI:  0.10 m                            Systemic Diam: 1.80 cm Buford Dresser MD Electronically signed by Buford Dresser MD Signature Date/Time: 10/13/2020/5:04:55 PM    Final    VAS [place] LOWER EXTREMITY VENOUS (DVT)  Result Date: 10/13/2020  Lower Venous DVT Study Indications: Edema.  Comparison Study: Previous RLE Duplex 02/2020- Negative Performing Technologist: Vonzell Schlatter  Examination Guidelines: A complete evaluation includes B-mode imaging, spectral Doppler, color Doppler, and power Doppler as needed of all accessible portions of each vessel. Bilateral testing is considered an integral part of a complete  examination. Limited examinations for reoccurring indications may be performed as noted. The reflux portion of the exam is performed with the patient in reverse Trendelenburg.  +---------+---------------+---------+-----------+----------+--------------+ RIGHT    CompressibilityPhasicitySpontaneityPropertiesThrombus Aging +---------+---------------+---------+-----------+----------+--------------+ CFV      Full           Yes      Yes                                 +---------+---------------+---------+-----------+----------+--------------+ SFJ      Full                                                        +---------+---------------+---------+-----------+----------+--------------+  FV Prox  Full                                                        +---------+---------------+---------+-----------+----------+--------------+ FV Mid   Full                                                        +---------+---------------+---------+-----------+----------+--------------+ FV DistalFull                                                        +---------+---------------+---------+-----------+----------+--------------+ PFV      Full                                                        +---------+---------------+---------+-----------+----------+--------------+ POP      Full           Yes      Yes                                 +---------+---------------+---------+-----------+----------+--------------+ PTV      Full                                                        +---------+---------------+---------+-----------+----------+--------------+ PERO     Full                                                        +---------+---------------+---------+-----------+----------+--------------+   +---------+---------------+---------+-----------+----------+--------------+ LEFT     CompressibilityPhasicitySpontaneityPropertiesThrombus Aging  +---------+---------------+---------+-----------+----------+--------------+ CFV      Full           Yes      Yes                                 +---------+---------------+---------+-----------+----------+--------------+ SFJ      Full                                                        +---------+---------------+---------+-----------+----------+--------------+ FV Prox  Full                                                        +---------+---------------+---------+-----------+----------+--------------+  FV Mid   Full                                                        +---------+---------------+---------+-----------+----------+--------------+ FV DistalFull                                                        +---------+---------------+---------+-----------+----------+--------------+ PFV      Full                                                        +---------+---------------+---------+-----------+----------+--------------+ POP      Full           Yes      Yes                                 +---------+---------------+---------+-----------+----------+--------------+ PTV      Full                                                        +---------+---------------+---------+-----------+----------+--------------+ PERO     Full                                                        +---------+---------------+---------+-----------+----------+--------------+     Summary: RIGHT: - There is no evidence of deep vein thrombosis in the lower extremity.  - No cystic structure found in the popliteal fossa.  LEFT: - There is no evidence of deep vein thrombosis in the lower extremity.  - No cystic structure found in the popliteal fossa.  *See table(s) above for measurements and observations. Electronically signed by Harold Barban MD on 10/13/2020 at 5:35:09 PM.    Final    DG Chest 1 View  10/13/2020 CLINICAL DATA:  Hypoxemia EXAM: CHEST  1 VIEW COMPARISON:   Radiograph 10/13/2020 FINDINGS: Endotracheal tube terminates in the mid trachea, 5.5 cm from the carina. Transesophageal tube tip and side port are distal to the GE junction, terminating below the margins of imaging. Telemetry leads and external support devices overlie the chest. Redemonstration of the heterogeneous opacity in both lungs,, right greater than left with some slight interval clearing in the right mid lung, possibly related to improving lung volumes when compared to prior study. No visible pneumothorax or effusion. The aorta is calcified. The remaining cardiomediastinal contours are unremarkable. No acute osseous or soft tissue abnormality.  IMPRESSION: 1. Persistent bilateral, opacities, right greater than left. Slight interval clearing in the right mid lung, possibly related to improving lung volumes when compared to prior study. 2. Support devices as above. Electronically Signed   By: March Rummage  [facility] M.D.   On: 10/13/2020 23:00   Lower Extremity Venous Duplex  10/13/2020: Summary: RIGHT: - There is no evidence of deep vein thrombosis in the lower extremity.  - No cystic structure found in the popliteal fossa.  LEFT: - There is no evidence of deep vein thrombosis in the lower extremity.  - No cystic structure found in the popliteal fossa.  *See table(s) above for measurements and observations. Electronically signed by Harold Barban MD on 10/13/2020 at 5:35:09 PM. Cardiac Studies:   Echocardiogram 10/13/2020:   1. Left ventricular ejection fraction, by estimation, is 25 to 30%. The left ventricle has severely decreased function. The left ventricle demonstrates regional wall motion abnormalities (see scoring diagram/findings for description). Left ventricular  diastolic parameters are indeterminate.  2. Right ventricular systolic function is normal. The right ventricular size is normal. There is normal pulmonary artery systolic pressure.  3. The mitral valve is grossly normal. Trivial mitral  valve regurgitation. No evidence of mitral stenosis.  4. The aortic valve is grossly normal. There is mild calcification of the aortic valve. Aortic valve regurgitation is not visualized. No aortic stenosis is present.  5. The inferior vena cava is normal in size with <50% respiratory variability, suggesting right atrial pressure of 8 mmHg.   EKG:  EKG 10/14/2020: Normal sinus rhythm at rate of 69 bpm, normal axis, poor R wave progression, cannot exclude anteroseptal infarct old.  T wave abnormality, anterolateral ischemia.  Compared to [place] [facility], 2021, poor R wave progression and T wave abnormality new.  Compared to yesterday, tibial abnormality In the anterolateral leads is new.  Assessment    1.  Flash pulmonary edema 2.  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  Echocardiogram personally reviewed, mid to distal anterior, anteroapical, mid to distal inferior and inferoapical akinesis.  Troponin leak out of proportion to severe LV systolic dysfunction. 3.  Lung infiltrates, chest x-ray repeated, right lung infiltrate persist, left side is cleared, still suspect pulmonary edema.  May have a component of aspiration, being managed by pulmonary critical care. 4.  Hyperlipidemia 5.  Elevated D-dimer    Recommendations:   I highly doubt that patient has ACS but cannot be completely excluded.  I am repeating another set of troponin.  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  Would recommend weaning him off of pressors as his blood pressures well maintained and restrict fluids and diurese him as tolerated.  I will start him on metoprolol tartrate 12.5 mg twice daily once norepinephrine drip is turned off.  Continue IV Lasix today.  In view of chronic kidney disease and slight worsening  creatinine will not initiate ACE inhibitor therapy or ARB.  Will consider starting him on BiDil once he is hemodynamically stable.  At this point, supportive care and close monitoring of his  I's and O's to have been negative fluid balance is most appropriate.  I have discussed with his family.  Restart Crestor through NG tube for now.  Elevated D-dimer probably related to underlying surgical issues and recent surgery, doubt that this could be PE.  DVT negative.  I have personally discussed with Dr. Chase Caller, pulmonary critical care.  For now continue with IV heparin until clinical picture is much more clear.  Eventually he will need cardiac work-up and probably cardiac catheterization.  40 minutes of critical care time spent making complex decisions and coordination of care.   Adrian Prows, MD, [facility] 10/14/2020, 10:04 AM Office: [phone]

## 2020-10-13 NOTE — Significant Event (Signed)
Rapid Response Event Note   Reason for Call :   Respiratory distress  Initial Focused Assessment:  Called from the charge nurse stating that the patient had been placed on 16 L NRB and still having trouble maintaining sufficient oxygen saturation.  The patient had significant secretions that were tenuous and difficult for him to clear.  I attempted NT suctioning this patient as well as the RT.  Some secretions were cleared but the patient was still struggling to breathe.  The patient was tachycardic, diaphoretic, and had significant rhonchi bilaterally.     Interventions:  NT suctioned, patient placed on bipap, and transferred to 4N-ICU for intubation   Event Summary:   MD Notified: Dr. Annette Stable, Dr. Lorin Mercy, and Dr. Valeta Harms Call Time: [place] Time: 1330 End Time: [place], RN

## 2020-10-13 NOTE — Progress Notes (Signed)
   10/13/20 1335  Assess: MEWS Score  Pulse Rate (!) 123  Level of Consciousness Alert  SpO2 (!) 85 %  O2 Device Non-rebreather Mask  O2 Flow Rate (L/min) 15 L/min  Assess: MEWS Score  MEWS Temp 0  MEWS Systolic 0  MEWS Pulse 2  MEWS RR 0  MEWS LOC 0  MEWS Score 2  MEWS Score Color Yellow  Assess: if the MEWS score is Yellow or Red  Were vital signs taken at a resting state? Yes  Focused Assessment Change from prior assessment (see assessment flowsheet)  Early Detection of Sepsis Score *See Row Information* Low  MEWS guidelines implemented *See Row Information* Yes  Treat  MEWS Interventions Escalated (See documentation below)  Take Vital Signs  Increase Vital Sign Frequency  Yellow: Q 2hr X 2 then Q 4hr X 2, if remains yellow, continue Q 4hrs  Escalate  MEWS: Escalate Yellow: discuss with charge nurse/RN and consider discussing with provider and RRT  Notify: Provider  Provider Name/Title Elwin Sleight, DO  Date Provider Notified 10/13/20  Time Provider Notified 1322  Notification Type Call  Notification Reason Change in status  Response See new orders  Date of Provider Response 10/13/20  Time of Provider Response 1322  Notify: Rapid Response  Name of Rapid Response RN Notified Saralyn Pilar, RN  Date Rapid Response Notified 10/13/20  Time Rapid Response Notified 3646  Document  Patient Outcome Transferred/level of care increased  Progress note created (see row info) Yes

## 2020-10-13 NOTE — Progress Notes (Addendum)
PCCM:  Notified after intubation and attempt at OGT placement by nursing there was cuff leak and loss of TV return to vent.  Tube exchange was planned.   Glide was re-inserted and the OGT was going through the vocal cords. This had ruptured the ETT balloon.   The OGT was removed from the patients airway.   The patient was extubated and re-intubated with another 8.0 ETT   [place] [facility] 10/13/2020 3:18 PM

## 2020-10-13 NOTE — Progress Notes (Addendum)
Pt c/o shortness of breath. Audible chest rhonchi. O2 72% on 2L [place]. Respiratory notified. Non-rebreather face mask placed on pt at 15L. O2 82-85%. HR 120's-130's. Oral suctioning performed, unsuccessful. Elwin Sleight, DO notified and to consult Hospitalist team. Rapid Response Nurse, Saralyn Pilar, notified and to bedside. Respiratory therapist, Lilia Pro, notified and to bedside. See new orders. Report called and given to My, RN on 4N. All questions answered to satisfaction. Pt transferred to 4N ICU room 25 by rapid response nurse and respiratory therapist.

## 2020-10-13 NOTE — Progress Notes (Signed)
After initial intubation air leak and volume loss was noted.  ETT adjusted and air added to cuff, with no change in leak.  MD changed out ETT due to cuff being blown from OG tube placement.  ETCO2 detected after change, pt receiving his set volume.  RT secured ETT and will continue to monitor

## 2020-10-13 NOTE — Progress Notes (Signed)
Patient pended to ICU as rapid response with anticipated intubation. Received verbal orders from Dr. Valeta Harms to pull RSI medications prior to patient arriving on unit. This nurse overrode medications under floorstock, but scanned on patient's MAR. While giving etomidate, patient's IV was lost and additional etomidate pulled from pyxis from the patient's med list.

## 2020-10-13 NOTE — Progress Notes (Signed)
eLink Physician-Brief Progress Note Patient Name: Dennis Carey DOB: [birth year] MRN: [id number]   Date of Service  10/13/2020  HPI/Events of Note  85 y.o. Caucasian male with hyperlipidemia, coronary calcification noted on the CT scan, chronic back pain s/p lumbar laminectomy on 04/20/2020, had been having chronic back pain and underwent uncomplicated J4-G9 laminectomy and fusion on 09/28/2020 and discharged home, but readmitted to the hospital with worsening pain under neurosurgery.    Red alert to room for worsening hypoxemia.  Camera: Intubated. Cuff leak earlier, ET exchanged.  RT bagging with sats around 80's. Third time she is doing same to recruite. SBP 150. 590/10/22/100% ABG: 7.26/38/44/17.5 Air entry dimished bilaterally. On heparin gtt.  Troponin elevated but stabilizing.    eICU Interventions  - notified bed side ICU team to go for evaluation at bed side Urgently - stat CxR ordered.  - get CBC for any anemia.      Intervention Category Major Interventions: Respiratory failure - evaluation and management  [name] 10/13/2020, 10:40 PM

## 2020-10-13 NOTE — Consult Note (Addendum)
NAME:  Dennis Carey, MRN:  [id number], DOB:  1935/05/10, LOS: 3 ADMISSION DATE:  10/19/2020, CONSULTATION DATE:  10/13/20 REFERRING MD:  Annette Stable, CHIEF COMPLAINT:  Respiratory distress   Brief History   Dennis Carey is 85 yo M w/ PMH of L5-S1 decompression surgery admit for pnumonia  History of present illness   Dennis Carey was recently admitted on 09/28/20 for severe R- sided L5-S1 decompressive laminotomy. He had no operative complications and was discharged home. He returned to [facility] 10 days post-op due to fatigue, weakness, and poorly controlled back pain. He was also found to have sick contact and he was admit to neurosurgery service for pain control, IV hydration and possible dispo to CIR.   Past Medical History  CAD, CKD3, HTN, [place] Hospital Events   10/26/2020 Admisson  Consults:  Neurosurg  Procedures:  10/13/20 Intubation  Significant Diagnostic Tests:  10/11/20 Chest X-ray, minimal left basilar atelectasis  Micro Data:  10/24/2020 COVID, Flu neg  Antimicrobials:  N/A  Interim history/subjective:  Examined and evaluated at bedside. Noted to be in respiratory distress with satting low 90s on FIO2 of 100 on Bipap with accessory muscle use. Discussed with family at bedside regarding plan for transfer to ICU  Objective   Blood pressure 133/73, pulse (!) 126, temperature 98.4 F (36.9 C), resp. rate 18, height 5\' 7"  (1.702 m), weight 70.3 kg, SpO2 (!) 82 %.        Intake/Output Summary (Last 24 hours) at 10/13/2020 1415 Last data filed at 10/13/2020 0600 Gross per 24 hour  Intake 2243.49 ml  Output 560 ml  Net 1683.49 ml   Filed Weights   10/12/20 1413  Weight: 70.3 kg    Examination: Gen: toxic-appearing HEENT: NCAT head, hearing intact, EOMI, On BiPAP Neck: supple, ROM intact, no JVD CV: Tachycardic, S1, S2 normal, No rubs, no murmurs, no gallops Pulm: Tachypneic, + accessory muscle use, diffuse rhonchi on bilateral lung fields Abd: Soft, BS+, NTND Extm:  ROM intact, Peripheral pulses intact, No peripheral edema Skin: Diaphoretic, warm Neuro: Unable to assess  Resolved Hospital Problem list     Assessment & Plan:   Acute hypoxic respiratory failure Presenting with acute hypoxic respiratory failure. Acute onset, mentions sick contact at home. Wife with walking pneumonia. X-ray showing bilateral opacities consistent with pneumonia. Telemetry showing new ST changes concerning for possible MI. Possible etiologies include pneumonia, ACS, PE. Will do further work-up. Meanwhile start broad-spectrum abx. F/u labs to guide treatment. - VENT bundle - F/u EKG, stat trop - Abg, cbc, cmp - Blood culture - CTA chest if renal fx stable - Fentanyl for sedation - Start empiric broad spectrum abx (vanc, cefepime)  L5-S1 DJD s/p laminectomy/decompression Post-op day 15. Per neurosurgery, no operative complications. Difficulty with post-op deconditioning and pain control. - On sedation for intubation - Per neurosurgery  Anxiety On valium at home - Hold home med  HLD Hold home meds: zetia, rosuvastatin  Best practice (evaluated daily)   Diet: NPO Pain/Anxiety/Delirium protocol (if indicated): Fentanyl VAP protocol (if indicated): vanc/cef DVT prophylaxis: SCD GI prophylaxis: PPI Glucose control: SSI Mobility: N/A last date of multidisciplinary goals of care discussion: 10/13/20 Family and staff present 10/13/20 Summary of discussion Transfer to ICU Follow up goals of care discussion due 10/14/20 Code Status: Full Disposition: To 4N  Labs   CBC: Recent Labs  Lab 11/01/2020 1759  WBC 6.1  NEUTROABS 3.5  HGB 9.8*  HCT 29.0*  MCV 91.2  PLT 255  Basic Metabolic Panel: Recent Labs  Lab 10/13/2020 1759  NA 139  K 4.0  CL 103  CO2 24  GLUCOSE 148*  BUN 22  CREATININE 1.32*  CALCIUM 8.5*   GFR: Estimated Creatinine Clearance: 39.6 mL/min (A) (by C-G formula based on SCr of 1.32 mg/dL (H)). Recent Labs  Lab 10/30/2020 1759  WBC  6.1    Liver Function Tests: Recent Labs  Lab 10/15/2020 1759  AST 29  ALT 24  ALKPHOS 74  BILITOT 0.6  PROT 6.2*  ALBUMIN 3.3*   No results for input(s): LIPASE, AMYLASE in the last 168 hours. No results for input(s): AMMONIA in the last 168 hours.  ABG No results found for: PHART, PCO2ART, PO2ART, HCO3, TCO2, ACIDBASEDEF, O2SAT   Coagulation Profile: No results for input(s): INR, PROTIME in the last 168 hours.  Cardiac Enzymes: No results for input(s): CKTOTAL, CKMB, CKMBINDEX, TROPONINI in the last 168 hours.  HbA1C: Hgb A1c MFr Bld  Date/Time Value Ref Range Status  10/12/2020 04:10 PM 7.7 (H) 4.8 - 5.6 % Final    Comment:    (NOTE) Pre diabetes:          5.7%-6.4%  Diabetes:              >6.4%  Glycemic control for   <7.0% adults with diabetes     CBG: Recent Labs  Lab 10/11/20 1422 10/12/20 1651 10/12/20 2011 10/13/20 0810 10/13/20 1155  GLUCAP 328* 324* 315* 166* 152*    Review of Systems:   Unable to assess  Past Medical History  He,  has a past medical history of Anxiety, Back pain, CAD (coronary artery disease), CKD (chronic kidney disease), stage III ([place]), HTN (hypertension), Hyperlipidemia, Microhematuria, and PSA elevation (2014).   Surgical History    Past Surgical History:  Procedure Laterality Date  . dental implants     3 of them 2008  . LUMBAR LAMINECTOMY/DECOMPRESSION MICRODISCECTOMY Right 04/20/2020   Procedure: Laminectomy and Foraminotomy - right - Lumbar five-Sacral one;  Surgeon: [name], MD;  Location: [place];  Service: Neurosurgery;  Laterality: Right;  . LUMBAR LAMINECTOMY/DECOMPRESSION MICRODISCECTOMY Right 06/08/2020   Procedure: Right Lumbar Two-Three Laminectomy and Foraminotomy;  Surgeon: [name], MD;  Location: [place];  Service: Neurosurgery;  Laterality: Right;  . submucous resection nose     1956     Social History   reports that he has never smoked. He has never used smokeless tobacco. He reports current  alcohol use. He reports that he does not use drugs.   Family History   His family history is negative for Colon cancer and Migraines.   Allergies Allergies  Allergen Reactions  . Sulfa Antibiotics Hives     Home Medications  Prior to Admission medications   Medication Sig Start Date End Date Taking? Authorizing Provider  aspirin 81 MG tablet Take 81 mg by mouth daily.   Yes [provider]  diazepam (VALIUM) 5 MG tablet Take 1 tablet (5 mg total) by mouth every 6 (six) hours as needed for muscle spasms. Patient taking differently: Take 2.5 mg by mouth every 6 (six) hours.  09/29/20  Yes Pool, Mallie Mussel, MD  docusate sodium (COLACE) 100 MG capsule Take 100 mg by mouth 3 (three) times daily with meals.    Yes [provider]  ezetimibe (ZETIA) 10 MG tablet Take 10 mg by mouth at bedtime.    Yes [provider]  HYDROcodone-acetaminophen (NORCO/VICODIN) 5-325 MG tablet Take 1 tablet by mouth  every 4 (four) hours.  06/06/20  Yes [provider]  polyethylene glycol powder (GLYCOLAX/MIRALAX) 17 GM/SCOOP powder Take 17 g by mouth See admin instructions. Mix 17 grams of powder into 4 ounces of water and drink every morning   Yes [provider]  pregabalin (LYRICA) 75 MG capsule Take 75 mg by mouth 2 (two) times daily. 05/23/20  Yes [provider]  rosuvastatin (CRESTOR) 20 MG tablet Take 20 mg by mouth at bedtime.    Yes [provider]    Mosetta Anis, MD 10/13/2020, 2:40 PM [place] [facility] Pager: [phone] After 5pm on weekdays and 1pm on weekends: On Call Pager: [phone]    Pulmonary critical care attending:  Patient seen in conjunction with Dr. Truman Hayward, medical resident.  Agree with documentation above.  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  Patient was admitted on 09/28/2020 for a L5-S1 decompressive laminectomy.  He was discharged home.  And returned to [facility], ED 10 days postop for  fatigue and weakness and poorly controlled back pain.  Continued to feel sick.  Stay on the floor patient became progressively short of breath and hypoxemic.  He was on a nonrebreather and transition to BiPAP.  Pulmonary critical care was consulted by [facility] hospitalist for consideration of transfer to the intensive care unit.  Upon arrival to the floor and evaluation the patient had sats in the low 90s on 100% BiPAP and failing respiratory mechanics with abdominal breathing.  Spoke with the patient's daughter and consented for transfer to the intensive care unit with endotracheal intubation.  BP 133/73 (BP Location: Right Arm)   Pulse (!) 132   Temp 98.4 F (36.9 C)   Resp 18   Ht 5\' 7"  (1.702 m)   Wt 70.3 kg   SpO2 (!) 85%   BMI 24.28 kg/m   General: Elderly male abdominal breathing, diaphoretic HEENT: Frothy secretions, tracking appropriately Heart: Tachycardic, regular, S1-S2 Lungs: Anterior rhonchi, coarse breath sounds bilaterally Abdomen: Mildly distended  Labs: Reviewed from 10/24/2020. Stat labs pending  Assessment: Acute hypoxemic respiratory failure failing NIPPV.  Decision made for transfer to the intensive care unit and endotracheal intubation for mechanical life support. Bilateral multifocal pneumonia, chest x-ray does have area of consolidation which is more concerning for pneumonia versus edema. Bilateral pulmonary edema, at time of intubation patient had frothy secretions coming from trachea consistent with pulmonary edema. Chest pains, diaphoresis CKD, baseline mildly elevated serum creatinine, unclear baseline but appears to be around 1.3  Plan: Admit to the ICU, adult mechanical ventilator protocol EKG Stat troponin Echocardiogram to look for wall motion abnormalities. Bilateral lower extremity duplex Hold off on CTA Was on DVT prophylaxis Check lactate Start antimicrobials, cefepime plus vancomycin for HCAP coverage PAD protocol sedation, fentanyl infusion  plus Precedex.  This patient is critically ill with multiple organ system failure; which, requires frequent high complexity decision making, assessment, support, evaluation, and titration of therapies. This was completed through the application of advanced monitoring technologies and extensive interpretation of multiple databases. During this encounter critical care time was devoted to patient care services described in this note for 33 minutes.  [facility] 10/13/2020 4:12 PM

## 2020-10-13 NOTE — Progress Notes (Signed)
Dr. [name] at bedside to place a right femoral central line.   Time out performed.

## 2020-10-13 NOTE — Progress Notes (Signed)
Spoke with cardiology team on call this pm. Discussed new Echo findings and rising troponin. Advised to continue diuresis, heparin, aspirin. Formal consult to follow in am.

## 2020-10-13 NOTE — Progress Notes (Signed)
Pharmacy Antibiotic Note  Dennis Carey is a 85 y.o. male admitted on 10/26/2020 with back s/p L5-S1 decompression and fusion 10 days prior. Pharmacy has been consulted for vancomycin/cefepime dosing for pneumonia.   The patient's SCr resulted as 1.48 << 1.32, CrCl~30-35 ml/min. Baseline appears to be 1.3-1.5. Will monitor trends in renal function closely.   Plan: - Cefepime 2g IV every 12 hours (next dose 12/5 @ 1000) - Vancomycin 1g IV every 24 hours (next dose 12/5 @ 2000) - Will continue to follow renal function, culture results, LOT, and antibiotic de-escalation plans   Height: 5\' 7"  (170.2 cm) Weight: 70.3 kg (155 lb) IBW/kg (Calculated) : 66.1  No data recorded.  Recent Labs  Lab 10/30/2020 1759 10/13/20 1513  WBC 6.1 16.7*  CREATININE 1.32* 1.48*  LATICACIDVEN  --  4.1*    Estimated Creatinine Clearance: 35.4 mL/min (A) (by C-G formula based on SCr of 1.48 mg/dL (H)).    Allergies  Allergen Reactions  . Sulfa Antibiotics Hives    Antimicrobials this admission: Vancomycin 12/4 >>  Cefepime 12/4 >>   Dose adjustments this admission: N/a  Microbiology results: 12/4 bcx:   Thank you for allowing pharmacy to be a part of this patient's care.  Alycia Rossetti, PharmD, BCPS Clinical Pharmacist Clinical phone for 10/13/2020: [phone] 10/13/2020 4:50 PM   **Pharmacist phone directory can now be found on [email or web address] ([email or web address]).  Listed under [place].

## 2020-10-13 NOTE — Procedures (Signed)
Intubation Procedure Note  BRANNEN KOPPEN  [id number]  Nov 19, 1934  Date:10/13/20  Time:3:15 PM   Provider Performing:Dellas Guard L Shanti Eichel    Procedure: Intubation (31500)  Indication(s) Respiratory Failure  Consent Risks of the procedure as well as the alternatives and risks of each were explained to the patient and/or caregiver.  Consent for the procedure was obtained and is signed in the bedside chart   Anesthesia Etomidate, Versed, Fentanyl and Rocuronium   Time Out Verified patient identification, verified procedure, site/side was marked, verified correct patient position, special equipment/implants available, medications/allergies/relevant history reviewed, required imaging and test results available.   Sterile Technique Usual hand hygeine, masks, and gloves were used   Procedure Description Patient positioned in bed supine.  Sedation given as noted above.  Patient was intubated with endotracheal tube using Glidescope.  View was Grade 1 full glottis .  Number of attempts was 1.  Colorimetric CO2 detector was consistent with tracheal placement.   Complications/Tolerance None; patient tolerated the procedure well. Chest X-ray is ordered to verify placement.   EBL Minimal   Specimen(s) None  Garner Nash, DO  Pulmonary Critical Care 10/13/2020 3:15 PM

## 2020-10-13 NOTE — Progress Notes (Signed)
Requesting physician: [name] - neurosurgery  Reason for consultation: Prior fusion.  Readmitted on 12/1 for pain control with confusion.  Placed on PCA, overmedicated.  Changed pain regimen with improvement in MS over the last few days.  Seen this AM by Dr. Reatha Armour and doing well.  Later in the day with productive cough, sats to the 70s.  Placed on NRB with improvement in sats to the 80s.  CXR ordered.              HPI: Dennis Carey is a 85 y.o. male with medical history significant of HTN; HLD; stage 3 CKD; and CAD based on calcium scoring test who presented from 11/19-20 for L5-S1 decompression and fusion.  He returned on 12/1 with uncontrolled pain and respiratory issues.  Pain medications were being titrated after oversedation and he was conversant this AM on rounds.  This afternoon, he became acutely SOB with hypoxia.    I was called in consultation and was notified by the neurosurgical NP that he was out of the hospital and the patient had not been seen.  As a result, I went upstairs to evaluate the patient.  Upon my arrival, the ICU team was already present.  We discussed the situation.  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  He had deep suctioning with significant secretions and brief improvement and then recurrent secretions.  Based on the tenuous nature of the situation, PCCM and I were in agreement that transfer to their service in the ICU appears to be most appropriate at this time.  [facility] will be happy to assume care when the patient is stable for transfer out of the ICU.    Carlyon Shadow, M.D.

## 2020-10-13 NOTE — Progress Notes (Addendum)
Called to see patient due to progressive desaturations.  Ventilator was set to high respiratory rate and high tidal volume and required frequent baggin to bring sats greater than 88%.  Performed emergent bronchoscopy at bed side to insure that the et tube was in correct position and that there wasn't any obstruction or mucous plugging.  The tube and airways were clear.  Then, due to acidosis on abg he was given 2 amps of bicarbonate with sats increasing to high 80s.  We were really unable to improve O2 sats greater than 88% for any appreciable period of time.  CXR with diffuse infiltrate in the right lung, less so in the right lung,, unchanged from post intubation film.  He also received a dose of paralytic which seemed to help his oxygen saturations.  All of this on a back ground of EF 25%, probable right sided PNA with pulmonary edema, possible PE on full dose heparin and elevated troponins this afternoon suggesting an ischemic event.   Patient also with worsening hypotension and poor IV access.  I discussed the decline in his status with wife and three children on the phone.  Explained CVL.   I answered all their questions to the best of my ability.    Patient's prognosis is poor at this point.  Will place cvl to start vasopressors.  Will keep paralyzed with tracrium.  Little else to offer.  Over an hour was spent at bedside not including bronchoscopy or procedures.

## 2020-10-13 NOTE — Progress Notes (Signed)
PCCM:  Consulted by Karmen Bongo, MD from [facility].  Recommending ICU level care and possible intubation for respiratory failure.  Full consult to follow.   Garner Nash, DO [facility] 10/13/2020 2:29 PM

## 2020-10-13 NOTE — Progress Notes (Signed)
ETT, retracted back 2 cm per MD order, now @ 25 cm, bilateral breath sounds noted.  No adverse rxns.  ETT is secure.

## 2020-10-13 NOTE — Progress Notes (Signed)
Pharmacy Antibiotic Note  Dennis Carey is a 85 y.o. male admitted on 11/07/2020 with back s/p L5-S1 decompression and fusion 10 days prior. Pharmacy has been consulted for vancomycin/cefepime dosing for pneumonia. Patient transferred to ICU for intubation. Last Scr was on 12/1 with CrCl of ~39 ml/min.   Plan: Cefepime 2g IV x1  Vancomycin 1500mg  x1 loading dose  Maintenance doses of cefepime and vancomycin to be ordered when stat CMP Scr results Monitor renal function, cultures/sensitivities, MRSA PCR results, and clinical progression  Height: 5\' 7"  (170.2 cm) Weight: 70.3 kg (155 lb) IBW/kg (Calculated) : 66.1  Temp (24hrs), Avg:98.4 F (36.9 C), Min:98.4 F (36.9 C), Max:98.4 F (36.9 C)  Recent Labs  Lab 10/22/2020 1759  WBC 6.1  CREATININE 1.32*    Estimated Creatinine Clearance: 39.6 mL/min (A) (by C-G formula based on SCr of 1.32 mg/dL (H)).    Allergies  Allergen Reactions  . Sulfa Antibiotics Hives    Antimicrobials this admission: Vancomycin 12/4 >>  Cefepime 12/4 >>   Dose adjustments this admission: N/a  Microbiology results: 12/4 bcx:   Thank you for allowing pharmacy to be a part of this patient's care.  Cristela Felt, PharmD Clinical Pharmacist  10/13/2020 2:37 PM

## 2020-10-13 NOTE — Plan of Care (Signed)
  Problem: Education: Goal: Knowledge of General Education information will improve Description: Including pain rating scale, medication(s)/side effects and non-pharmacologic comfort measures Outcome: Progressing   Problem: Health Behavior/Discharge Planning: Goal: Ability to manage health-related needs will improve Outcome: Progressing   Problem: Clinical Measurements: Goal: Ability to maintain clinical measurements within normal limits will improve Outcome: Progressing Goal: Will remain free from infection Outcome: Progressing   Problem: Activity: Goal: Risk for activity intolerance will decrease Outcome: Progressing   Problem: Nutrition: Goal: Adequate nutrition will be maintained Outcome: Progressing   Problem: Elimination: Goal: Will not experience complications related to urinary retention Outcome: Progressing   Problem: Pain Managment: Goal: General experience of comfort will improve Outcome: Progressing   Problem: Safety: Goal: Ability to remain free from injury will improve Outcome: Progressing   Problem: Skin Integrity: Goal: Risk for impaired skin integrity will decrease Outcome: Progressing

## 2020-10-13 NOTE — Progress Notes (Signed)
ANTICOAGULATION CONSULT NOTE - Initial Consult  Pharmacy Consult for Heparin Indication: chest pain/ACS  Allergies  Allergen Reactions  . Sulfa Antibiotics Hives    Patient Measurements: Height: 5\' 7"  (170.2 cm) Weight: 70.3 kg (155 lb) IBW/kg (Calculated) : 66.1 Heparin Dosing Weight: 70.3 kg  Vital Signs: Temp: 97.4 F (36.3 C) (12/04 1600) Temp Source: Axillary (12/04 1600) BP: 100/66 (12/04 1700) Pulse Rate: 96 (12/04 1700)  Labs: Recent Labs    10/13/20 1513  HGB 11.7*  HCT 39.0  PLT 349  CREATININE 1.48*  TROPONINIHS 147*    Estimated Creatinine Clearance: 35.4 mL/min (A) (by C-G formula based on SCr of 1.48 mg/dL (H)).   Medical History: Past Medical History:  Diagnosis Date  . Anxiety   . Back pain   . CAD (coronary artery disease)    significant CAD by cardiac calcium scoring test  . CKD (chronic kidney disease), stage III ([place])    by serum creatinine is stable  . HTN (hypertension)    pt denies   . Hyperlipidemia   . Microhematuria   . PSA elevation 2014   4.75 February 2017, 6.9 in 2/18 and declines urologist eval or trial of antibiotics    Assessment: 29 YOM s/p recent L5-S1 decompression and fusion surgery on 09/28/20 readmitted 12/1 with back pain, imaging not concerning for wound infection or complications. The patient developed respiratory distress and required intubation earlier today. Now with bump in troponins and concern for ACS - pharmacy consulted to dose Heparin for anticoagulation.   Will not bolus and aim for low goal due to the recent surgery. It is noted that lovenox 40 mg SQ x 1 was just given at 1630 for VTE prophylaxis - residual concentrations will likely affect initial heparin levels. Will start low and titrate up to goal range.   Goal of Therapy:  Heparin level 0.3-0.5 units/ml Monitor platelets by anticoagulation protocol: Yes   Plan:  - Start Heparin at 750 units/hr (7.5 ml/hr) - Daily HL, CBC - Will continue to  monitor for any signs/symptoms of bleeding and will follow up with heparin level in 8 hours   Thank you for allowing pharmacy to be a part of this patient's care.  Alycia Rossetti, PharmD, BCPS Clinical Pharmacist Clinical phone for 10/13/2020: [phone] 10/13/2020 6:21 PM   **Pharmacist phone directory can now be found on [email or web address] ([email or web address]).  Listed under [place].

## 2020-10-13 NOTE — Progress Notes (Signed)
Patient with acute respiratory failure earlier this afternoon.  Prior to the onset of respiratory failure the patient had been somewhat restless with pain but was not having any noted shortness of breath or other symptoms beyond left leg pain.  Patient has required intubation for treatment of his respiratory failure.  Chest x-ray with bilateral infiltrates.  Patient has been started on broad spectrum antibiotics.  Plan to pursue aggressive pulmonary suctioning.  Doubt pulmonary embolus as he recently had negative lower extremity Doppler duplex studies.  Patient is critically ill and given his advanced age and deconditioning I am very worried about his ability to improve from his respiratory failure.  Continue with the aggressive antibiotics and oxygenation.  Hopefully his situation will improve.  Family is aware of this significant worsening.

## 2020-10-13 NOTE — Progress Notes (Signed)
  Echocardiogram 2D Echocardiogram has been performed.  Dennis Carey 10/13/2020, 4:32 PM

## 2020-10-13 NOTE — Progress Notes (Signed)
   Providing Compassionate, Quality Care - Together  NEUROSURGERY PROGRESS NOTE   S: No issues overnight.  Complains of pain 10/10 however appears to be resting comfortably.  Did not sleep well overnight.  O: EXAM:  BP 133/73 (BP Location: Right Arm)   Pulse 74   Temp 98.4 F (36.9 C)   Resp 18   Ht 5\' 7"  (1.702 m)   Wt 70.3 kg   SpO2 90%   BMI 24.28 kg/m   Awake, alert, oriented  Speech fluent, appropriate, does perseverate and focus on odd subjects PERRLA EOMI CNs grossly intact  5/5 BUE/BLE  Incision clean dry and intact  ASSESSMENT:  85 y.o. male with  Uncontrolled pain postop Acute delirium, improving  PLAN: -Had a lengthy discussion with the patient's children at bedside.  Would recommend continuing current pain regimen of Ultram, oxy, Toradol, Robaxin.  He does not appear to be very uncomfortable although he does complain of 10/10 pain.  He was over narcotize therefore we are slowly titrating his pain medications at this time. -I stressed mobilization significantly with the patient and the family.  The family agrees with the current plan for pain control. -Continue supportive care    Thank you for allowing me to participate in this patient's care.  Please do not hesitate to call with questions or concerns.   Elwin Sleight, [facility] Cell: [phone]

## 2020-10-14 ENCOUNTER — Inpatient Hospital Stay (HOSPITAL_COMMUNITY): Payer: Medicare Other

## 2020-10-14 DIAGNOSIS — R579 Shock, unspecified: Secondary | ICD-10-CM

## 2020-10-14 DIAGNOSIS — N179 Acute kidney failure, unspecified: Secondary | ICD-10-CM

## 2020-10-14 DIAGNOSIS — R6521 Severe sepsis with septic shock: Secondary | ICD-10-CM

## 2020-10-14 DIAGNOSIS — A419 Sepsis, unspecified organism: Secondary | ICD-10-CM

## 2020-10-14 DIAGNOSIS — J9601 Acute respiratory failure with hypoxia: Secondary | ICD-10-CM | POA: Diagnosis not present

## 2020-10-14 LAB — BLOOD GAS, ARTERIAL
Acid-Base Excess: 4.2 mmol/L — ABNORMAL HIGH (ref 0.0–2.0)
Bicarbonate: 27.9 mmol/L (ref 20.0–28.0)
FIO2: 90
O2 Saturation: 96.9 %
Patient temperature: 37.4
pCO2 arterial: 40.6 mmHg (ref 32.0–48.0)
pH, Arterial: 7.454 — ABNORMAL HIGH (ref 7.350–7.450)
pO2, Arterial: 89.3 mmHg (ref 83.0–108.0)

## 2020-10-14 LAB — CBC WITH DIFFERENTIAL/PLATELET
Abs Immature Granulocytes: 0.04 10*3/uL (ref 0.00–0.07)
Basophils Absolute: 0 10*3/uL (ref 0.0–0.1)
Basophils Relative: 0 %
Eosinophils Absolute: 0 10*3/uL (ref 0.0–0.5)
Eosinophils Relative: 0 %
HCT: 31.9 % — ABNORMAL LOW (ref 39.0–52.0)
Hemoglobin: 10.6 g/dL — ABNORMAL LOW (ref 13.0–17.0)
Immature Granulocytes: 0 %
Lymphocytes Relative: 21 %
Lymphs Abs: 2.2 10*3/uL (ref 0.7–4.0)
MCH: 30.4 pg (ref 26.0–34.0)
MCHC: 33.2 g/dL (ref 30.0–36.0)
MCV: 91.4 fL (ref 80.0–100.0)
Monocytes Absolute: 0.2 10*3/uL (ref 0.1–1.0)
Monocytes Relative: 2 %
Neutro Abs: 8 10*3/uL — ABNORMAL HIGH (ref 1.7–7.7)
Neutrophils Relative %: 77 %
Platelets: 319 10*3/uL (ref 150–400)
RBC: 3.49 MIL/uL — ABNORMAL LOW (ref 4.22–5.81)
RDW: 12.6 % (ref 11.5–15.5)
WBC: 10.3 10*3/uL (ref 4.0–10.5)
nRBC: 0 % (ref 0.0–0.2)

## 2020-10-14 LAB — COMPREHENSIVE METABOLIC PANEL
ALT: 38 U/L (ref 0–44)
ALT: 36 U/L (ref 0–44)
AST: 43 U/L — ABNORMAL HIGH (ref 15–41)
AST: 42 U/L — ABNORMAL HIGH (ref 15–41)
Albumin: 2.4 g/dL — ABNORMAL LOW (ref 3.5–5.0)
Albumin: 2.2 g/dL — ABNORMAL LOW (ref 3.5–5.0)
Alkaline Phosphatase: 62 U/L (ref 38–126)
Alkaline Phosphatase: 60 U/L (ref 38–126)
Anion gap: 15 (ref 5–15)
Anion gap: 13 (ref 5–15)
BUN: 46 mg/dL — ABNORMAL HIGH (ref 8–23)
BUN: 42 mg/dL — ABNORMAL HIGH (ref 8–23)
CO2: 22 mmol/L (ref 22–32)
CO2: 26 mmol/L (ref 22–32)
Calcium: 7 mg/dL — ABNORMAL LOW (ref 8.9–10.3)
Calcium: 6.7 mg/dL — ABNORMAL LOW (ref 8.9–10.3)
Chloride: 105 mmol/L (ref 98–111)
Chloride: 102 mmol/L (ref 98–111)
Creatinine, Ser: 1.81 mg/dL — ABNORMAL HIGH (ref 0.61–1.24)
Creatinine, Ser: 1.72 mg/dL — ABNORMAL HIGH (ref 0.61–1.24)
GFR, Estimated: 37 mL/min — ABNORMAL LOW (ref >60)
GFR, Estimated: 39 mL/min — ABNORMAL LOW (ref >60)
Glucose, Bld: 221 mg/dL — ABNORMAL HIGH (ref 70–99)
Glucose, Bld: 213 mg/dL — ABNORMAL HIGH (ref 70–99)
Potassium: 4.2 mmol/L (ref 3.5–5.1)
Potassium: 3.4 mmol/L — ABNORMAL LOW (ref 3.5–5.1)
Sodium: 142 mmol/L (ref 135–145)
Sodium: 141 mmol/L (ref 135–145)
Total Bilirubin: 1.3 mg/dL — ABNORMAL HIGH (ref 0.3–1.2)
Total Bilirubin: 1.4 mg/dL — ABNORMAL HIGH (ref 0.3–1.2)
Total Protein: 5 g/dL — ABNORMAL LOW (ref 6.5–8.1)
Total Protein: 5 g/dL — ABNORMAL LOW (ref 6.5–8.1)

## 2020-10-14 LAB — GLUCOSE, CAPILLARY
Glucose-Capillary: 204 mg/dL — ABNORMAL HIGH (ref 70–99)
Glucose-Capillary: 220 mg/dL — ABNORMAL HIGH (ref 70–99)
Glucose-Capillary: 211 mg/dL — ABNORMAL HIGH (ref 70–99)
Glucose-Capillary: 214 mg/dL — ABNORMAL HIGH (ref 70–99)
Glucose-Capillary: 224 mg/dL — ABNORMAL HIGH (ref 70–99)
Glucose-Capillary: 237 mg/dL — ABNORMAL HIGH (ref 70–99)

## 2020-10-14 LAB — LACTIC ACID, PLASMA
Lactic Acid, Venous: 4.6 mmol/L (ref 0.5–1.9)
Lactic Acid, Venous: 2.8 mmol/L (ref 0.5–1.9)

## 2020-10-14 LAB — CBC
HCT: 29.1 % — ABNORMAL LOW (ref 39.0–52.0)
Hemoglobin: 9.9 g/dL — ABNORMAL LOW (ref 13.0–17.0)
MCH: 30.6 pg (ref 26.0–34.0)
MCHC: 34 g/dL (ref 30.0–36.0)
MCV: 89.8 fL (ref 80.0–100.0)
Platelets: 301 10*3/uL (ref 150–400)
RBC: 3.24 MIL/uL — ABNORMAL LOW (ref 4.22–5.81)
RDW: 12.5 % (ref 11.5–15.5)
WBC: 12.6 10*3/uL — ABNORMAL HIGH (ref 4.0–10.5)
nRBC: 0 % (ref 0.0–0.2)

## 2020-10-14 LAB — POCT I-STAT 7, (LYTES, BLD GAS, ICA,H+H)
Acid-Base Excess: 0 mmol/L (ref 0.0–2.0)
Bicarbonate: 24.7 mmol/L (ref 20.0–28.0)
Calcium, Ion: 1 mmol/L — ABNORMAL LOW (ref 1.15–1.40)
HCT: 31 % — ABNORMAL LOW (ref 39.0–52.0)
Hemoglobin: 10.5 g/dL — ABNORMAL LOW (ref 13.0–17.0)
O2 Saturation: 88 %
Patient temperature: 100.3
Potassium: 4.2 mmol/L (ref 3.5–5.1)
Sodium: 143 mmol/L (ref 135–145)
TCO2: 26 mmol/L (ref 22–32)
pCO2 arterial: 42.7 mmHg (ref 32.0–48.0)
pH, Arterial: 7.374 (ref 7.350–7.450)
pO2, Arterial: 60 mmHg — ABNORMAL LOW (ref 83.0–108.0)

## 2020-10-14 LAB — TROPONIN I (HIGH SENSITIVITY)
Troponin I (High Sensitivity): 1860 ng/L (ref <18)
Troponin I (High Sensitivity): 1642 ng/L (ref <18)

## 2020-10-14 LAB — HEPARIN LEVEL (UNFRACTIONATED)
Heparin Unfractionated: 0.72 IU/mL — ABNORMAL HIGH (ref 0.30–0.70)
Heparin Unfractionated: 0.5 IU/mL (ref 0.30–0.70)
Heparin Unfractionated: 0.45 IU/mL (ref 0.30–0.70)

## 2020-10-14 LAB — PROTIME-INR
INR: 1.6 — ABNORMAL HIGH (ref 0.8–1.2)
Prothrombin Time: 18.5 seconds — ABNORMAL HIGH (ref 11.4–15.2)

## 2020-10-14 LAB — STREP PNEUMONIAE URINARY ANTIGEN: Strep Pneumo Urinary Antigen: NEGATIVE

## 2020-10-14 LAB — PROCALCITONIN: Procalcitonin: 108.79 ng/mL

## 2020-10-14 MED ORDER — ONDANSETRON HCL 4 MG PO TABS: 4.0000 mg | ORAL_TABLET | Freq: Four times a day (QID) | ORAL | Status: DC | PRN

## 2020-10-14 MED ORDER — CALCIUM GLUCONATE-NACL 2-0.675 GM/100ML-% IV SOLN
2.0000 g | Freq: Once | INTRAVENOUS | Status: AC
  Administered 2020-10-14: 2000 mg via INTRAVENOUS
  Filled 2020-10-14: qty 100

## 2020-10-14 MED ORDER — LACTATED RINGERS IV BOLUS
250.0000 mL | Freq: Once | INTRAVENOUS | Status: AC
  Administered 2020-10-14: 250 mL via INTRAVENOUS

## 2020-10-14 MED ORDER — ROSUVASTATIN CALCIUM 5 MG PO TABS
10.0000 mg | ORAL_TABLET | Freq: Every day | ORAL | Status: DC
  Administered 2020-10-14 – 2020-10-15 (×2): 10 mg
  Filled 2020-10-14 (×3): qty 2

## 2020-10-14 MED ORDER — ROSUVASTATIN CALCIUM 20 MG PO TABS: 20.0000 mg | ORAL_TABLET | Freq: Every day | ORAL | Status: DC

## 2020-10-14 MED ORDER — MIDAZOLAM HCL 2 MG/2ML IJ SOLN
1.0000 mg | INTRAMUSCULAR | Status: DC | PRN
  Administered 2020-10-16 – 2020-10-19 (×9): 2 mg via INTRAVENOUS
  Filled 2020-10-14 (×10): qty 2

## 2020-10-14 MED ORDER — INSULIN ASPART 100 UNIT/ML ~~LOC~~ SOLN
0.0000 [IU] | SUBCUTANEOUS | Status: DC
  Administered 2020-10-14 (×4): 5 [IU] via SUBCUTANEOUS
  Administered 2020-10-15: 3 [IU] via SUBCUTANEOUS
  Administered 2020-10-15: 5 [IU] via SUBCUTANEOUS
  Administered 2020-10-15 (×2): 3 [IU] via SUBCUTANEOUS
  Administered 2020-10-15: 5 [IU] via SUBCUTANEOUS
  Administered 2020-10-16: 11 [IU] via SUBCUTANEOUS
  Administered 2020-10-16: 8 [IU] via SUBCUTANEOUS
  Administered 2020-10-16 (×3): 11 [IU] via SUBCUTANEOUS
  Administered 2020-10-16: 8 [IU] via SUBCUTANEOUS
  Administered 2020-10-17: 5 [IU] via SUBCUTANEOUS
  Administered 2020-10-17: 2 [IU] via SUBCUTANEOUS
  Administered 2020-10-17: 11 [IU] via SUBCUTANEOUS
  Administered 2020-10-17: 8 [IU] via SUBCUTANEOUS
  Administered 2020-10-17: 3 [IU] via SUBCUTANEOUS
  Administered 2020-10-17: 8 [IU] via SUBCUTANEOUS
  Administered 2020-10-18 (×2): 3 [IU] via SUBCUTANEOUS
  Administered 2020-10-18: 5 [IU] via SUBCUTANEOUS
  Administered 2020-10-19: 2 [IU] via SUBCUTANEOUS
  Administered 2020-10-19: 8 [IU] via SUBCUTANEOUS
  Administered 2020-10-19 (×2): 3 [IU] via SUBCUTANEOUS
  Administered 2020-10-20: 08:00:00 5 [IU] via SUBCUTANEOUS
  Administered 2020-10-20: 16:00:00 2 [IU] via SUBCUTANEOUS
  Administered 2020-10-20: 04:00:00 5 [IU] via SUBCUTANEOUS
  Administered 2020-10-20: 2 [IU] via SUBCUTANEOUS
  Administered 2020-10-20: 12:00:00 3 [IU] via SUBCUTANEOUS
  Administered 2020-10-21: 04:00:00 2 [IU] via SUBCUTANEOUS
  Administered 2020-10-21: 12:00:00 3 [IU] via SUBCUTANEOUS
  Administered 2020-10-21: 17:00:00 5 [IU] via SUBCUTANEOUS
  Administered 2020-10-21 – 2020-10-22 (×5): 3 [IU] via SUBCUTANEOUS
  Administered 2020-10-22: 05:00:00 5 [IU] via SUBCUTANEOUS
  Administered 2020-10-22 – 2020-10-23 (×5): 3 [IU] via SUBCUTANEOUS
  Administered 2020-10-23 (×2): 5 [IU] via SUBCUTANEOUS
  Administered 2020-10-23 (×2): 3 [IU] via SUBCUTANEOUS
  Administered 2020-10-24 (×3): 5 [IU] via SUBCUTANEOUS
  Administered 2020-10-24: 12:00:00 3 [IU] via SUBCUTANEOUS
  Administered 2020-10-24: 08:00:00 2 [IU] via SUBCUTANEOUS
  Administered 2020-10-24 – 2020-10-25 (×2): 3 [IU] via SUBCUTANEOUS
  Administered 2020-10-25 (×4): 5 [IU] via SUBCUTANEOUS
  Administered 2020-10-25: 09:00:00 8 [IU] via SUBCUTANEOUS
  Administered 2020-10-26: 04:00:00 5 [IU] via SUBCUTANEOUS
  Administered 2020-10-26: 16:00:00 3 [IU] via SUBCUTANEOUS
  Administered 2020-10-26: 20:00:00 2 [IU] via SUBCUTANEOUS
  Administered 2020-10-26 – 2020-10-27 (×4): 3 [IU] via SUBCUTANEOUS
  Administered 2020-10-27 (×4): 2 [IU] via SUBCUTANEOUS
  Administered 2020-10-28 (×2): 5 [IU] via SUBCUTANEOUS
  Administered 2020-10-28: 04:00:00 3 [IU] via SUBCUTANEOUS
  Administered 2020-10-28: 01:00:00 8 [IU] via SUBCUTANEOUS
  Administered 2020-10-29: 04:00:00 3 [IU] via SUBCUTANEOUS
  Administered 2020-10-29: 5 [IU] via SUBCUTANEOUS

## 2020-10-14 MED ORDER — ONDANSETRON HCL 4 MG/2ML IJ SOLN: 4.0000 mg | Freq: Four times a day (QID) | INTRAMUSCULAR | Status: DC | PRN

## 2020-10-14 MED ORDER — PROSOURCE TF PO LIQD
45.0000 mL | Freq: Two times a day (BID) | ORAL | Status: DC
  Administered 2020-10-14 – 2020-10-15 (×3): 45 mL
  Filled 2020-10-14 (×3): qty 45

## 2020-10-14 MED ORDER — PANTOPRAZOLE SODIUM 40 MG PO PACK
40.0000 mg | PACK | Freq: Every day | ORAL | Status: DC
  Administered 2020-10-15 – 2020-10-30 (×16): 40 mg
  Filled 2020-10-14 (×18): qty 20

## 2020-10-14 MED ORDER — VASOPRESSIN 20 UNITS/100 ML INFUSION FOR SHOCK
0.0000 [IU]/min | INTRAVENOUS | Status: DC
  Administered 2020-10-14 – 2020-10-15 (×3): 0.03 [IU]/min via INTRAVENOUS
  Administered 2020-10-16 – 2020-10-18 (×2): 0.02 [IU]/min via INTRAVENOUS
  Administered 2020-10-18: 0.01 [IU]/min via INTRAVENOUS
  Filled 2020-10-14 (×6): qty 100

## 2020-10-14 MED ORDER — SODIUM CHLORIDE 0.9 % IV SOLN: INTRAVENOUS | Status: DC | PRN

## 2020-10-14 MED ORDER — POLYETHYLENE GLYCOL 3350 17 G PO PACK
17.0000 g | PACK | Freq: Every day | ORAL | Status: DC
  Administered 2020-10-14 – 2020-10-19 (×6): 17 g
  Filled 2020-10-14 (×5): qty 1

## 2020-10-14 MED ORDER — ACETAMINOPHEN 160 MG/5ML PO SOLN
650.0000 mg | ORAL | Status: DC | PRN
  Administered 2020-10-14 – 2020-10-25 (×15): 650 mg
  Filled 2020-10-14 (×16): qty 20.3

## 2020-10-14 MED ORDER — CHLORHEXIDINE GLUCONATE 0.12% ORAL RINSE (MEDLINE KIT)
15.0000 mL | Freq: Two times a day (BID) | OROMUCOSAL | Status: DC
  Administered 2020-10-14 – 2020-10-30 (×33): 15 mL via OROMUCOSAL

## 2020-10-14 MED ORDER — ORAL CARE MOUTH RINSE
15.0000 mL | OROMUCOSAL | Status: DC
  Administered 2020-10-14 – 2020-10-31 (×164): 15 mL via OROMUCOSAL

## 2020-10-14 MED ORDER — VITAL HIGH PROTEIN PO LIQD
1000.0000 mL | ORAL | Status: DC
  Administered 2020-10-14: 1000 mL
  Filled 2020-10-14: qty 1000

## 2020-10-14 NOTE — Procedures (Signed)
Diagnostic Bronchoscopy Procedure Note   Dennis Carey  [id number]  1935/09/18  Date:10/14/20  Time:12:06 AM   Provider Performing:Melda Mermelstein A [name]  Procedure: Diagnostic Bronchoscopy [phone])  Indication(s) Assist with direct visualization due to inability to ventilate on mechanical ventilation  Consent Performed emergently at bedside   Anesthesia On precedex   Time Out Verified patient identification, verified procedure, site/side was marked, verified correct patient position, special equipment/implants available, medications/allergies/relevant history reviewed, required imaging and test results available.   Sterile Technique Usual hand hygiene, masks, gowns, and gloves were used   Procedure Description Bronchoscope advanced through endotracheal tube and into airway.  There was no evidence of tube or airway obstruction or mucous plugging   Complications/Tolerance None; patient tolerated the procedure well..   EBL None   Specimen(s) None

## 2020-10-14 NOTE — Procedures (Signed)
Central Venous Catheter Insertion Procedure Note  Dennis Carey  [id number]  29-Jul-1935  Date:10/14/20  Time:12:33 AM   Provider Performing:Johm Pfannenstiel A [name]   Procedure: Insertion of Non-tunneled Central Venous [phone]) with US guidance ([id number])   Indication(s) Medication administration and Difficult access  Consent Risks of the procedure as well as the alternatives and risks of each were explained to the patient and/or caregiver.  Consent for the procedure was obtained and is signed in the bedside chart  Anesthesia Topical only with 1% lidocaine   Timeout Verified patient identification, verified procedure, site/side was marked, verified correct patient position, special equipment/implants available, medications/allergies/relevant history reviewed, required imaging and test results available.  Sterile Technique Maximal sterile technique including full sterile barrier drape, hand hygiene, sterile gown, sterile gloves, mask, hair covering, sterile ultrasound probe cover (if used).  Procedure Description Area of catheter insertion was cleaned with chlorhexidine and draped in sterile fashion.  With real-time ultrasound guidance a central venous catheter was placed into the right femoral vein. Nonpulsatile blood flow and easy flushing noted in all ports.  The catheter was sutured in place and sterile dressing applied.  Complications/Tolerance None; patient tolerated the procedure well. Chest X-ray is ordered to verify placement for internal jugular or subclavian cannulation.   Chest x-ray is not ordered for femoral cannulation.  EBL Minimal  Specimen(s) None

## 2020-10-14 NOTE — Progress Notes (Signed)
[facility] for Heparin Indication: chest pain/ACS  Allergies  Allergen Reactions  . Sulfa Antibiotics Hives    Patient Measurements: Height: 5\' 7"  (170.2 cm) Weight: 70.3 kg (155 lb) IBW/kg (Calculated) : 66.1 Heparin Dosing Weight: 70.3 kg  Vital Signs: Temp: 100.4 F (38 C) (12/05 0400) Temp Source: Oral (12/05 0400) BP: 107/53 (12/05 0448) Pulse Rate: 76 (12/05 0448)  Labs: Recent Labs    10/13/20 1513 10/13/20 1801 10/13/20 1902 10/13/20 2233 10/13/20 2303 10/13/20 2303 10/14/20 0306 10/14/20 0345  HGB 11.7*  --   --    < > 11.7*   < > 10.5* 10.6*  HCT 39.0  --   --    < > 36.6*  --  31.0* 31.9*  PLT 349  --   --   --  354  --   --  319  HEPARINUNFRC  --   --   --   --   --   --   --  0.72*  CREATININE 1.48*  --   --   --   --   --   --  1.81*  TROPONINIHS 147* 1,057* 1,033*  --   --   --   --   --    < > = values in this interval not displayed.    Estimated Creatinine Clearance: 28.9 mL/min (A) (by C-G formula based on SCr of 1.81 mg/dL (H)).   Assessment: 85 y.o. male with SOB/VDRF, elevated troponin for heparin  Goal of Therapy:  Heparin level 0.3-0.5 units/ml Monitor platelets by anticoagulation protocol: Yes   Plan:  Decrease Heparin 600 units/hr Check heparin level in 8 hours.  Phillis Knack, PharmD, BCPS

## 2020-10-14 NOTE — Progress Notes (Signed)
Subjective: Patient reports intubated and sedated.   Objective: Vital signs in last 24 hours: Temp:  [97.4 F (36.3 C)-100.4 F (38 C)] 98.6 F (37 C) (12/05 0700) Pulse Rate:  [72-132] 72 (12/05 0800) Resp:  [18-33] 19 (12/05 0800) BP: (64-153)/(49-91) 115/67 (12/05 0800) SpO2:  [82 %-98 %] 98 % (12/05 0820) Arterial Line BP: (76-153)/(39-67) 131/60 (12/05 0800) FiO2 (%):  [100 %] 100 % (12/05 0820)  Intake/Output from previous day: 12/04 0701 - 12/05 0700 In: 1954.1 [I.V.:1571.3; IV Piggyback:382.8] Out: 1300 [Urine:1300] Intake/Output this shift: No intake/output data recorded.  Physical Exam: Patient is unresponsive, sedated on the vent. Unable to follow commands. No movement to painful stimulus. Pupils 3 mm, PERRLA  Lab Results: Recent Labs    10/13/20 2303 10/13/20 2303 10/14/20 0306 10/14/20 0345  WBC 7.6  --   --  10.3  HGB 11.7*   < > 10.5* 10.6*  HCT 36.6*   < > 31.0* 31.9*  PLT 354  --   --  319   < > = values in this interval not displayed.   BMET Recent Labs    10/13/20 1513 10/13/20 2233 10/14/20 0306 10/14/20 0345  NA 141   < > 143 142  K 4.6   < > 4.2 4.2  CL 111  --   --  105  CO2 18*  --   --  22  GLUCOSE 259*  --   --  221*  BUN 41*  --   --  46*  CREATININE 1.48*  --   --  1.81*  CALCIUM 7.5*  --   --  7.0*   < > = values in this interval not displayed.    Studies/Results: DG Chest 1 View  Result Date: 10/13/2020 CLINICAL DATA:  Hypoxemia EXAM: CHEST  1 VIEW COMPARISON:  Radiograph 10/13/2020 FINDINGS: Endotracheal tube terminates in the mid trachea, 5.5 cm from the carina. Transesophageal tube tip and side port are distal to the GE junction, terminating below the margins of imaging. Telemetry leads and external support devices overlie the chest. Redemonstration of the heterogeneous opacity in both lungs,, right greater than left with some slight interval clearing in the right mid lung, possibly related to improving lung volumes when  compared to prior study. No visible pneumothorax or effusion. The aorta is calcified. The remaining cardiomediastinal contours are unremarkable. No acute osseous or soft tissue abnormality. IMPRESSION: 1. Persistent bilateral, opacities, right greater than left. Slight interval clearing in the right mid lung, possibly related to improving lung volumes when compared to prior study. 2. Support devices as above. Electronically Signed   By: Lovena Le M.D.   On: 10/13/2020 23:00   DG Abd 1 View  Result Date: 10/13/2020 CLINICAL DATA:  OG tube placement EXAM: ABDOMEN - 1 VIEW COMPARISON:  Radiograph 10/13/2020 FINDINGS: Transesophageal tube tip terminates near the gastric antrum/duodenal bulb. Side port in the distal gastric body, beyond the GE junction. Paucity of abdominal bowel gas is nonspecific without evidence of high-grade bowel obstruction. Multifocal patchy opacities are present in the lung bases, more coalescent in the right lower lung. Trace effusions. Cardiomediastinal contours are stable from prior. Degenerative changes present in the spine. Lower lumbar fusion hardware partially visualized. IMPRESSION: 1. Transesophageal tube tip terminates near the gastric antrum/duodenal bulb. Side port in the distal gastric body, beyond the GE junction. 2. Multifocal pneumonia. Electronically Signed   By: Lovena Le M.D.   On: 10/13/2020 17:37   DG CHEST PORT 1 VIEW  Result Date: 10/13/2020 CLINICAL DATA:  Follow-up lung infiltrates. Endotracheal tube and nasal/orogastric tube placement. EXAM: PORTABLE CHEST 1 VIEW COMPARISON:  10/13/2020 at 1:47 p.m. FINDINGS: Endotracheal tube tip lies at the carina, but does not extend towards either mainstem bronchus. Consider retracting 1-2 cm. Nasal/orogastric tube passes well below the diaphragm into the stomach. Bilateral airspace lung opacities are without significant change from the earlier exam allowing for differences in radiographic technique. IMPRESSION: 1.  Endotracheal tube tip projects at the carinal. Consider retracting 1-2 cm for more optimal positioning. 2. Well-positioned nasal/orogastric tube. 3. No change in the bilateral airspace lung opacities consistent with multifocal pneumonia. Electronically Signed   By: Lajean Manes M.D.   On: 10/13/2020 15:50   DG CHEST PORT 1 VIEW  Result Date: 10/13/2020 CLINICAL DATA:  Oxygen desaturation EXAM: PORTABLE CHEST 1 VIEW COMPARISON:  10/11/2020 FINDINGS: Cardiac shadow is stable. The lungs are well aerated bilaterally. Airspace opacities are noted right considerably greater than left consistent with multifocal pneumonia. No bony abnormality is seen. No other focal abnormality is noted. IMPRESSION: Bilateral airspace opacities consistent with multifocal pneumonia. Electronically Signed   By: Inez Catalina M.D.   On: 10/13/2020 13:55   ECHOCARDIOGRAM COMPLETE  Result Date: 10/13/2020    ECHOCARDIOGRAM REPORT   Patient Name:   Dennis Carey Date of Exam: 10/13/2020 Medical Rec #:  [id number]      Height:       67.0 in Accession #:    [id number]     Weight:       155.0 lb Date of Birth:  Feb 24, 1935     BSA:          1.815 m Patient Age:    85 years       BP:           133/73 mmHg Patient Gender: M              HR:           102 bpm. Exam Location:  Inpatient Procedure: 2D Echo STAT ECHO Indications:    acute coronary syndrome  History:        Patient has no prior history of Echocardiogram examinations.  Sonographer:    Johny Chess Referring Phys: Craig Comments: Echo performed with patient supine and on artificial respirator. Image acquisition challenging due to uncooperative patient. IMPRESSIONS  1. Left ventricular ejection fraction, by estimation, is 25 to 30%. The left ventricle has severely decreased function. The left ventricle demonstrates regional wall motion abnormalities (see scoring diagram/findings for description). Left ventricular diastolic parameters are indeterminate.  2.  Right ventricular systolic function is normal. The right ventricular size is normal. There is normal pulmonary artery systolic pressure.  3. The mitral valve is grossly normal. Trivial mitral valve regurgitation. No evidence of mitral stenosis.  4. The aortic valve is grossly normal. There is mild calcification of the aortic valve. Aortic valve regurgitation is not visualized. No aortic stenosis is present.  5. The inferior vena cava is normal in size with <50% respiratory variability, suggesting right atrial pressure of 8 mmHg. Comparison(s): No prior Echocardiogram. Conclusion(s)/Recommendation(s): Reduced EF with focal wall motion abnormalities. Communicated findings with Drs. Pool, Yates, and Icard. FINDINGS  Left Ventricle: Left ventricular ejection fraction, by estimation, is 25 to 30%. The left ventricle has severely decreased function. The left ventricle demonstrates regional wall motion abnormalities. The left ventricular internal cavity size was normal  in size. There is no left ventricular hypertrophy.  Left ventricular diastolic parameters are indeterminate.  LV Wall Scoring: The entire septum is akinetic. The entire anterior wall, entire lateral wall, entire inferior wall, and apex are hypokinetic. Right Ventricle: The right ventricular size is normal. Right vetricular wall thickness was not well visualized. Right ventricular systolic function is normal. There is normal pulmonary artery systolic pressure. The tricuspid regurgitant velocity is 2.48 m/s, and with an assumed right atrial pressure of 8 mmHg, the estimated right ventricular systolic pressure is 59.5 mmHg. Left Atrium: Left atrial size was normal in size. Right Atrium: Right atrial size was normal in size. Pericardium: There is no evidence of pericardial effusion. Mitral Valve: The mitral valve is grossly normal. Trivial mitral valve regurgitation. No evidence of mitral valve stenosis. Tricuspid Valve: The tricuspid valve is grossly normal.  Tricuspid valve regurgitation is trivial. Aortic Valve: The aortic valve is grossly normal. There is mild calcification of the aortic valve. Aortic valve regurgitation is not visualized. No aortic stenosis is present. Pulmonic Valve: The pulmonic valve was not well visualized. Pulmonic valve regurgitation is not visualized. No evidence of pulmonic stenosis. Aorta: The aortic root, ascending aorta, aortic arch and descending aorta are all structurally normal, with no evidence of dilitation or obstruction. Venous: The inferior vena cava is normal in size with less than 50% respiratory variability, suggesting right atrial pressure of 8 mmHg. IAS/Shunts: The atrial septum is grossly normal.  LEFT VENTRICLE PLAX 2D LVIDd:         4.10 cm     Diastology LVIDs:         3.40 cm     LV e' medial:    4.24 cm/s LV PW:         1.10 cm     LV E/e' medial:  10.3 LV IVS:        0.70 cm     LV e' lateral:   5.87 cm/s LVOT diam:     1.80 cm     LV E/e' lateral: 7.4 LV SV:         25 LV SV Index:   14 LVOT Area:     2.54 cm  LV Volumes (MOD) LV vol d, MOD A2C: 46.3 ml LV vol d, MOD A4C: 68.7 ml LV vol s, MOD A2C: 34.6 ml LV vol s, MOD A4C: 50.5 ml LV SV MOD A2C:     11.7 ml LV SV MOD A4C:     68.7 ml LV SV MOD BP:      15.6 ml RIGHT VENTRICLE             IVC RV S prime:     21.80 cm/s  IVC diam: 1.40 cm TAPSE (M-mode): 2.2 cm LEFT ATRIUM             Index LA diam:        2.70 cm 1.49 cm/m LA Vol (A2C):   23.5 ml 12.95 ml/m LA Vol (A4C):   35.7 ml 19.67 ml/m LA Biplane Vol: 30.4 ml 16.75 ml/m  AORTIC VALVE LVOT Vmax:   68.40 cm/s LVOT Vmean:  45.900 cm/s LVOT VTI:    0.097 m  AORTA Ao Root diam: 3.00 cm Ao Asc diam:  3.10 cm MITRAL VALVE               TRICUSPID VALVE MV Area (PHT): 3.19 cm    TR Peak grad:   24.6 mmHg MV Decel Time: 238 msec    TR Vmax:  248.00 cm/s MV E velocity: 43.50 cm/s MV A velocity: 53.80 cm/s  SHUNTS MV E/A ratio:  0.81        Systemic VTI:  0.10 m                            Systemic Diam: 1.80 cm  Buford Dresser MD Electronically signed by Buford Dresser MD Signature Date/Time: 10/13/2020/5:04:55 PM    Final    VAS [place] LOWER EXTREMITY VENOUS (DVT)  Result Date: 10/13/2020  Lower Venous DVT Study Indications: Edema.  Comparison Study: Previous RLE Duplex 02/2020- Negative Performing Technologist: Vonzell Schlatter  Examination Guidelines: A complete evaluation includes B-mode imaging, spectral Doppler, color Doppler, and power Doppler as needed of all accessible portions of each vessel. Bilateral testing is considered an integral part of a complete examination. Limited examinations for reoccurring indications may be performed as noted. The reflux portion of the exam is performed with the patient in reverse Trendelenburg.  +---------+---------------+---------+-----------+----------+--------------+ RIGHT    CompressibilityPhasicitySpontaneityPropertiesThrombus Aging +---------+---------------+---------+-----------+----------+--------------+ CFV      Full           Yes      Yes                                 +---------+---------------+---------+-----------+----------+--------------+ SFJ      Full                                                        +---------+---------------+---------+-----------+----------+--------------+ FV Prox  Full                                                        +---------+---------------+---------+-----------+----------+--------------+ FV Mid   Full                                                        +---------+---------------+---------+-----------+----------+--------------+ FV DistalFull                                                        +---------+---------------+---------+-----------+----------+--------------+ PFV      Full                                                        +---------+---------------+---------+-----------+----------+--------------+ POP      Full           Yes      Yes                                  +---------+---------------+---------+-----------+----------+--------------+  PTV      Full                                                        +---------+---------------+---------+-----------+----------+--------------+ PERO     Full                                                        +---------+---------------+---------+-----------+----------+--------------+   +---------+---------------+---------+-----------+----------+--------------+ LEFT     CompressibilityPhasicitySpontaneityPropertiesThrombus Aging +---------+---------------+---------+-----------+----------+--------------+ CFV      Full           Yes      Yes                                 +---------+---------------+---------+-----------+----------+--------------+ SFJ      Full                                                        +---------+---------------+---------+-----------+----------+--------------+ FV Prox  Full                                                        +---------+---------------+---------+-----------+----------+--------------+ FV Mid   Full                                                        +---------+---------------+---------+-----------+----------+--------------+ FV DistalFull                                                        +---------+---------------+---------+-----------+----------+--------------+ PFV      Full                                                        +---------+---------------+---------+-----------+----------+--------------+ POP      Full           Yes      Yes                                 +---------+---------------+---------+-----------+----------+--------------+ PTV      Full                                                        +---------+---------------+---------+-----------+----------+--------------+  PERO     Full                                                         +---------+---------------+---------+-----------+----------+--------------+     Summary: RIGHT: - There is no evidence of deep vein thrombosis in the lower extremity.  - No cystic structure found in the popliteal fossa.  LEFT: - There is no evidence of deep vein thrombosis in the lower extremity.  - No cystic structure found in the popliteal fossa.  *See table(s) above for measurements and observations. Electronically signed by Harold Barban MD on 10/13/2020 at 5:35:09 PM.    Final     Assessment/Plan: Patient with acute respiratory failure yesterday requiring intubation and transfer to the ICU. Chest x-ray with bilateral infiltrates.  Patient has been started on broad spectrum antibiotics.   Overnight patient had episodes of desaturation. Emergent bronchoscopy performed. ABG with acidosis requiring 2 amps of bicarbonate with sats increasing to high 80's. CXR with diffuse infiltrate in the right lung, less so in the right lung, unchanged from post intubation film. He also received a dose of paralytic overnight which seemed to help his oxygen saturations. Central line placed overnight due to poor IV access. Continue with the aggressive antibiotics, oxygenation, and supportive care.    LOS: 4 days     Marvis Moeller, DNP, NP-C 10/14/2020, 9:03 AM

## 2020-10-14 NOTE — Procedures (Signed)
Arterial Catheter Insertion Procedure Note  Dennis Carey  [id number]  [birth year]  Date:10/14/20  Time:12:11 AM    Provider Performing: Cordella Register    Procedure: Insertion of Arterial Line [phone]) without US guidance  Indication(s) Blood pressure monitoring and/or need for frequent ABGs  Consent Unable to obtain consent due to emergent nature of procedure.  Anesthesia None   Time Out Verified patient identification, verified procedure, site/side was marked, verified correct patient position, special equipment/implants available, medications/allergies/relevant history reviewed, required imaging and test results available.   Sterile Technique Maximal sterile technique including full sterile barrier drape, hand hygiene, sterile gown, sterile gloves, mask, hair covering, sterile ultrasound probe cover (if used).   Procedure Description Area of catheter insertion was cleaned with chlorhexidine and draped in sterile fashion. With real-time ultrasound guidance an arterial catheter was placed into the right radial artery.  Appropriate arterial tracings confirmed on monitor.     Complications/Tolerance None; patient tolerated the procedure well.   EBL    Specimen(s) None

## 2020-10-14 NOTE — Progress Notes (Signed)
Assisted tele visit to patient with son.  Preudhomme,Felicia M, RN

## 2020-10-14 NOTE — Progress Notes (Signed)
[facility] Progress Note Patient Name: Dennis Carey DOB: 12-Sep-1935 MRN: [id number]   Date of Service  10/14/2020  HPI/Events of Note  Edwena Felty, RN asking for sputum from ET- for culture.   eICU Interventions  Tracheal aspirate from ET to send for culture. Is ordered. S/p bronch earlier. For AHRF- no mucous plugs. Received rocuronium paralysis with sats improvement.      Intervention Category Minor Interventions: Other:  [name] 10/14/2020, 2:29 AM

## 2020-10-14 NOTE — Progress Notes (Signed)
Brief Nutrition Note RD working remotely.   Consult received for enteral/tube feeding initiation and management. No OGT/NGT documented at this time. Patient had emergent bronch overnight. CCM note from overnight states poor prognosis.  Adult Enteral Nutrition Protocol initiated. Will order trickle rate TF for now of Vital High Protein @ 20 ml/hr with 45 ml Prosource TF BID for once OGT/NGT placed. Full assessment to follow.  Admitting Dx: Postoperative pain [G89.18]  Body mass index is 24.28 kg/m. Pt meets criteria for normal weight based on current BMI.  Labs:  Recent Labs  Lab 10/19/2020 1759 10/28/2020 1759 10/13/20 1513 10/13/20 1513 10/13/20 2233 10/14/20 0306 10/14/20 0345  NA 139   < > 141   < > 140 143 142  K 4.0   < > 4.6   < > 4.8 4.2 4.2  CL 103  --  111  --   --   --  105  CO2 24  --  18*  --   --   --  22  BUN 22  --  41*  --   --   --  46*  CREATININE 1.32*  --  1.48*  --   --   --  1.81*  CALCIUM 8.5*  --  7.5*  --   --   --  7.0*  GLUCOSE 148*  --  259*  --   --   --  221*   < > = values in this interval not displayed.         Jarome Matin, MS, RD, LDN, CNSC Inpatient Clinical Dietitian RD pager # available in [facility]  After hours/weekend pager # available in [facility]

## 2020-10-14 NOTE — Progress Notes (Signed)
  PM round with family (daughter and wife) at bedside. RN also present  1. Shock is sepsis (PCT > 100, Lacate 2.8a nd better). Pressor needs better  2. Resp failure - likely ALI/ARDS with s-chf - improved to 60% fio2/peep 10.   3. RN reporting new cuff leak - controoled by reinflation balloon  4. Creat better to 1.7mg %  5. Family discussion     Multi-Disciplinary Goals of Care Discussion Date of Discussion 10/14/2020 and is Day 4 since admit  Primary service for patient nsgy  Location of discussion Bedside,  Family and Staff present RN, MD, daughter wife,   Summary of discussion They showed living wil which indicates comfort under 3 situations - severe dementia, PVS, terminally ill Because patient does not meet any of this -> they indicated full code  Conversations around pain control, prognosis, LTAC +  Followup goals of care due by 10/21/20  Misc comments if any They do understand his prognosis is poor      Additional 45 min ccm time     SIGNATURE    Dr. Brand Males, M.D., F.C.C.P,  Pulmonary and Critical Care Medicine Staff Physician, [place] Director - Interstitial Lung Disease  Program  Pulmonary [place] [facility] [place], [place], [address]  Pager: [phone], If no answer  OR between  19:00-7:00h: page [phone]  [phone] Telephone (clinical office): [phone] Telephone (research): [phone]  6:48 PM 10/14/2020

## 2020-10-14 NOTE — Progress Notes (Signed)
Patient remains intubated and critically ill.  On 100% FiO2 and 7 of PEEP.  O2 sats around 90.  PO2 60 on ABG.  Lactic acidosis has been largely corrected with bicarb.  Patient on norepinephrine drip for circulatory support.  Urine output currently good.  Creatinine slightly elevated from baseline but patient not in any severe degree of renal failure at present.  Chest x-rays with significant bilateral opacification worrisome for pneumonitis.  Cardiac status has largely stabilized.  Troponins worrisome for some degree of cardiac injury but not felt to be secondary to ACS.  Patient with profound respiratory failure secondary to mucous obstruction and likely hospital-acquired pneumonia with secondary pulmonary edema.  Continue management by CCM.  I had discussions with the family and they recognize how critically ill the patient is.

## 2020-10-14 NOTE — Progress Notes (Signed)
[facility] for Heparin Indication: chest pain/ACS  Allergies  Allergen Reactions  . Sulfa Antibiotics Hives    Patient Measurements: Height: 5\' 7"  (170.2 cm) Weight: 70.3 kg (155 lb) IBW/kg (Calculated) : 66.1 Heparin Dosing Weight: 70.3 kg  Vital Signs: Temp: 99.3 F (37.4 C) (12/05 1210) Temp Source: Axillary (12/05 1210) BP: 110/64 (12/05 1300) Pulse Rate: 67 (12/05 1300)  Labs: Recent Labs    10/13/20 1513 10/13/20 1513 10/13/20 1801 10/13/20 1902 10/13/20 2233 10/13/20 2303 10/13/20 2303 10/14/20 0306 10/14/20 0345 10/14/20 1010 10/14/20 1313  HGB 11.7*  --   --   --    < > 11.7*   < > 10.5* 10.6*  --   --   HCT 39.0  --   --   --    < > 36.6*  --  31.0* 31.9*  --   --   PLT 349  --   --   --   --  354  --   --  319  --   --   HEPARINUNFRC  --   --   --   --   --   --   --   --  0.72*  --  0.50  CREATININE 1.48*  --   --   --   --   --   --   --  1.81*  --   --   TROPONINIHS 147*   < > 1,057* 1,033*  --   --   --   --   --  1,860*  --    < > = values in this interval not displayed.    Estimated Creatinine Clearance: 28.9 mL/min (A) (by C-G formula based on SCr of 1.81 mg/dL (H)).   Assessment: 85 y.o. 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 No AC PTA. Pharmacy consulted for heparin.  HL 0.5 at goal on 600 units/hr. H/H, plt stable.    Goal of Therapy:  Heparin level 0.3-0.5 units/ml Monitor platelets by anticoagulation protocol: Yes   Plan:  Continue Heparin at 600 units/hr F/u confirmatory level  Monitor daily HL, CBC/plt Monitor for signs/symptoms of bleeding    Benetta Spar, PharmD, BCPS, BCCP Clinical Pharmacist  Please check AMION for all [place] phone numbers After 10:00 PM, call [place]

## 2020-10-14 NOTE — Consult Note (Signed)
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 He had no operative complications and was discharged home. He returned to [facility] 10 days post-op due to fatigue, weakness, and poorly controlled back pain. He was also found to have sick contact and he was admit to neurosurgery service for pain control, IV hydration and possible dispo to CIR.   Past Medical History  CAD, CKD3, HTN, [facility] Events   10/16/2020 Admisson  Consults:  Neurosurg  Procedures:  10/13/20 Intubation 12/5 - cVL R Femoral 12./5 bronch Significant Diagnostic Tests:  10/11/20 Chest X-ray, minimal left basilar atelectasis  Micro Data:  11/03/2020 COVID, Flu neg 12/4 - MRSA PCR neg 12/4 bRonch tracheal aspirate - GNR, GPC growing  Antimicrobials:  Vanc 12/5 >. 12/5 (MRSA PCR neg and has AKI( CEfepime 12.5 >>  Interim history/subjective:    10/14/2020 - remains intubated on vent. 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 Holding off ace inhibito but continuing iv lasix. Holding off cath    Febrile to 100.4 with high wbc. Both normalized today/  CVP !3  On fent gtt, precedex gtt, levophed gtt (PCT  < 0.1), bic gtt, heparin gtt  Creat worse 1.8 but bic normal on bic gtt. CXR worse this morning    Objective   Blood pressure 120/70, pulse 69, temperature 98.6 F (37 C), temperature source Oral, resp. rate 19, height 5\' 7"  (1.702 m), weight 70.3 kg, SpO2 99 %. CVP:  [11 mmHg-27 mmHg] 11 mmHg  Vent Mode: PRVC FiO2 (%):  [100 %] 100 % Set Rate:  [18  bmp-22 bmp] 20 bmp Vt Set:  [520 mL-530 mL] 520 mL PEEP:  [7 cmH20-10 cmH20] 7 cmH20 Plateau Pressure:  [18 cmH20-24 cmH20] 23 cmH20   Intake/Output Summary (Last 24 hours) at 10/14/2020 1026 Last data filed at 10/14/2020 0914 Gross per 24 hour  Intake 2449.47 ml  Output 1300 ml  Net 1149.47 ml   Filed Weights   10/12/20 1413  Weight: 70.3 kg    General Appearance:  Looks criticall ill. IN pain Head:  Normocephalic, without obvious abnormality, atraumatic Eyes:  PERRL - yes, conjunctiva/corneas - muddy     Ears:  Normal external ear canals, both ears Nose:  G tube - no Throat:  ETT TUBE - yes , OG tube - yes Neck:  Supple,  No enlargement/tenderness/nodules Lungs: Clear to auscultation bilaterally, Ventilator   Synchrony - yes with 100% fio2. Peep 7 Heart:  S1 and S2 normal, no murmur, CVP - 12.  Pressors - levophjed Abdomen:  Soft, no masses, no organomegaly Genitalia / Rectal:  Not done Extremities:  Extremities- intact Skin:  ntact in exposed areas . Sacral area - not examined Neurologic:  Sedation - fent gtt, precerdex gtt -> RASS - -1 . Moves all 4s - yes. CAM-ICU - not tested . Orientation - recognizing daughter and wife. Appears in pain      Resolved Hospital Problem list     Assessment & Plan:   L5-S1 DJD s/p laminectomy/decompression Post-op day 16.  Per neurosurgery, no operative complications. Difficulty with post-op deconditioning and pain control. - On sedation for intubation - Per neurosurgery    Acute hypoxic respiratory failure due to acute pulm edema +/- HCAP  10/14/2020 - > does not meet criteria for SBT/Extubation in setting of Acute Respiratory Failure due to worsening CXr, fio2 100% and peep 7 needs and circulatory shock  Plan  - PRVC - goal pusle ox > 88%  - VAP bundle   Circulatory shock  10/14/2020 - sepsis v Cardiac  V med precedex  Plan  - levophe for MAP > 65 but aim to wean off   Acute systolic CHF - new onset 42/3   -  10/14/2020 - seen by DR Einar Gip. Takasubo high on ddx  Plan  - lasix per him  - no ace inhiubitor and beta blocker for the moment  - continue IV heparin gtt - statin per cards  Possible HCAP  10/14/2020 - fever improved  Plan  - await cultures  -dc vanc (AKI and MRSA PCR neg)  - continue cefepime    AKI  10/14/2020 - rising creat but making urine  Plan -  dc vanc - monitor esp now lasix on board by cards   Acidosis - lactic acidosis  10/14/2020 - on bic gtt. Concern is adding to volume overload  Plan  - reduce bic gttt to [facility] -> check abg and lactate -> if pH ok -> dc big gtt    Anxiety on valium at home Chronic pain from back SEdation needs on ventilator  10/14/2020 - following commands. In chronic pain during wua. On fent gtt and precedex gtt  Plan  - RASS goal 0 to -3 and vent synchrony  - fent gtt - precedex gtt - fent prn - versed prnm    Anemia of Critical illness  10/14/2020 - no bleeding  Plan  - PRBC for hgb </= 6.9gm%    - exceptions are   -  if ACS susepcted/confirmed then transfuse for hgb </= 8.0gm%,  or    -  active bleeding with hemodynamic instability, then transfuse regardless of hemoglobin value   At at all times try to transfuse 1 unit prbc as possible with exception of active hemorrhage      Best practice (evaluated daily)   Diet: NPO. strt TF Pain/Anxiety/Delirium protocol (if indicated): Fentanyl and precedex gtt VAP protocol (if indicated): Oral care DVT prophylaxis: SCD GI prophylaxis: PPI Glucose control: SSI Mobility: N/A Code Status: Full Disposition: To 4N     Multi-Disciplinary Goals of Care Discussion Date of Discussion 10/14/2020 and is Day 4 since admit  Primary service for patient NSGY  Location of discussion Bedside 4n25  Family and Staff present RN Bernardo Heater, MD Dr Chase Caller, wife of patient and daughter. Patient unable to participate due to vent  Summary of discussion [place]. Continue full active  care and full code. Wife worried that patient under lot of stress due to pain and is concerned he has taken a major setback  Followup goals of care due by 10/21/20  Misc comments if any           ATTESTATION & SIGNATURE   The patient Dennis Carey is critically ill with multiple organ systems failure and requires high complexity decision making for assessment and support, frequent evaluation and titration of therapies, application of advanced monitoring technologies and extensive interpretation of multiple databases.   Critical Care Time devoted to patient care services described in this note is  60  Minutes. This time reflects time of care of this signee Dr Brand Males. This critical care time does not reflect procedure time, or teaching time or supervisory time of PA/NP/Med student/Med Resident etc but could involve care discussion time     Dr. Brand Males, M.D., [facility].C.P Pulmonary and Critical Care Medicine Staff Physician [place] [facility] Pager: [phone], If no answer or between  15:00h - 7:00h: call [phone]  10/14/2020 10:28 AM     LABS    PULMONARY Recent Labs  Lab 10/13/20 1645 10/13/20 2233 10/14/20 0306  PHART 7.226* 7.263* 7.374  PCO2ART 46.8 38.8 42.7  PO2ART 60.2* 44* 60*  HCO3 18.7* 17.5* 24.7  TCO2  --  19* 26  O2SAT 85.7 74.0 88.0    CBC Recent Labs  Lab 10/13/20 1513 10/13/20 2233 10/13/20 2303 10/14/20 0306 10/14/20 0345  HGB 11.7*   < > 11.7* 10.5* 10.6*  HCT 39.0   < > 36.6* 31.0* 31.9*  WBC 16.7*  --  7.6  --  10.3  PLT 349  --  354  --  319   < > = values in this interval not displayed.    COAGULATION No results for input(s): INR in the last 168 hours.  CARDIAC  No results for input(s): TROPONINI in the last 168 hours. No results for input(s): PROBNP in the last 168 hours.   CHEMISTRY Recent Labs  Lab 11/03/2020 1759 11/06/2020 1759 10/13/20 1513 10/13/20 1513  10/13/20 2233 10/13/20 2233 10/14/20 0306 10/14/20 0345  NA 139  --  141  --  140  --  143 142  K 4.0   < > 4.6   < > 4.8   < > 4.2 4.2  CL 103  --  111  --   --   --   --  105  CO2 24  --  18*  --   --   --   --  22  GLUCOSE 148*  --  259*  --   --   --   --  221*  BUN 22  --  41*  --   --   --   --  46*  CREATININE 1.32*  --  1.48*  --   --   --   --  1.81*  CALCIUM 8.5*  --  7.5*  --   --   --   --  7.0*   < > = values in this interval not displayed.   Estimated Creatinine Clearance: 28.9 mL/min (A) (by C-G formula based on SCr of 1.81 mg/dL (H)).   LIVER Recent Labs  Lab 11/08/2020 1759 10/13/20 1513 10/14/20 0345  AST 29 56* 43*  ALT 24 43 38  ALKPHOS 74 81 62  BILITOT 0.6 0.7 1.3*  PROT 6.2* 5.9* 5.0*  ALBUMIN 3.3* 2.9* 2.4*     INFECTIOUS Recent Labs  Lab 10/13/20 1513 10/13/20 1801  LATICACIDVEN 4.1* 3.6*  PROCALCITON <0.10  --      ENDOCRINE CBG (last 3)  Recent Labs    10/13/20 1640 10/13/20 2158 10/14/20 0811  GLUCAP 215* 177* 204*         IMAGING x48h  - image(s) personally visualized  -   highlighted in bold DG Chest 1 View  Result Date: 10/13/2020 CLINICAL DATA:  Hypoxemia EXAM: CHEST  1 VIEW COMPARISON:  Radiograph 10/13/2020 FINDINGS: Endotracheal tube terminates in the mid trachea, 5.5 cm from  the carina. Transesophageal tube tip and side port are distal to the GE junction, terminating below the margins of imaging. Telemetry leads and external support devices overlie the chest. Redemonstration of the heterogeneous opacity in both lungs,, right greater than left with some slight interval clearing in the right mid lung, possibly related to improving lung volumes when compared to prior study. No visible pneumothorax or effusion. The aorta is calcified. The remaining cardiomediastinal contours are unremarkable. No acute osseous or soft tissue abnormality. IMPRESSION: 1. Persistent bilateral, opacities, right greater than left. Slight interval  clearing in the right mid lung, possibly related to improving lung volumes when compared to prior study. 2. Support devices as above. Electronically Signed   By: Lovena Le M.D.   On: 10/13/2020 23:00   DG Abd 1 View  Result Date: 10/13/2020 CLINICAL DATA:  OG tube placement EXAM: ABDOMEN - 1 VIEW COMPARISON:  Radiograph 10/13/2020 FINDINGS: Transesophageal tube tip terminates near the gastric antrum/duodenal bulb. Side port in the distal gastric body, beyond the GE junction. Paucity of abdominal bowel gas is nonspecific without evidence of high-grade bowel obstruction. Multifocal patchy opacities are present in the lung bases, more coalescent in the right lower lung. Trace effusions. Cardiomediastinal contours are stable from prior. Degenerative changes present in the spine. Lower lumbar fusion hardware partially visualized. IMPRESSION: 1. Transesophageal tube tip terminates near the gastric antrum/duodenal bulb. Side port in the distal gastric body, beyond the GE junction. 2. Multifocal pneumonia. Electronically Signed   By: Lovena Le M.D.   On: 10/13/2020 17:37   DG CHEST PORT 1 VIEW  Result Date: 10/13/2020 CLINICAL DATA:  Follow-up lung infiltrates. Endotracheal tube and nasal/orogastric tube placement. EXAM: PORTABLE CHEST 1 VIEW COMPARISON:  10/13/2020 at 1:47 p.m. FINDINGS: Endotracheal tube tip lies at the carina, but does not extend towards either mainstem bronchus. Consider retracting 1-2 cm. Nasal/orogastric tube passes well below the diaphragm into the stomach. Bilateral airspace lung opacities are without significant change from the earlier exam allowing for differences in radiographic technique. IMPRESSION: 1. Endotracheal tube tip projects at the carinal. Consider retracting 1-2 cm for more optimal positioning. 2. Well-positioned nasal/orogastric tube. 3. No change in the bilateral airspace lung opacities consistent with multifocal pneumonia. Electronically Signed   By: Lajean Manes  M.D.   On: 10/13/2020 15:50   DG CHEST PORT 1 VIEW  Result Date: 10/13/2020 CLINICAL DATA:  Oxygen desaturation EXAM: PORTABLE CHEST 1 VIEW COMPARISON:  10/11/2020 FINDINGS: Cardiac shadow is stable. The lungs are well aerated bilaterally. Airspace opacities are noted right considerably greater than left consistent with multifocal pneumonia. No bony abnormality is seen. No other focal abnormality is noted. IMPRESSION: Bilateral airspace opacities consistent with multifocal pneumonia. Electronically Signed   By: Inez Catalina M.D.   On: 10/13/2020 13:55   ECHOCARDIOGRAM COMPLETE  Result Date: 10/13/2020    ECHOCARDIOGRAM REPORT   Patient Name:   Dennis Carey Date of Exam: 10/13/2020 Medical Rec #:  [id number]      Height:       67.0 in Accession #:    [id number]     Weight:       155.0 lb Date of Birth:  09/19/1935     BSA:          1.815 m Patient Age:    85 years       BP:           133/73 mmHg Patient Gender: M  HR:           102 bpm. Exam Location:  Inpatient Procedure: 2D Echo STAT ECHO Indications:    acute coronary syndrome  History:        Patient has no prior history of Echocardiogram examinations.  Sonographer:    Johny Chess Referring Phys: [place] Comments: Echo performed with patient supine and on artificial respirator. Image acquisition challenging due to uncooperative patient. IMPRESSIONS  1. Left ventricular ejection fraction, by estimation, is 25 to 30%. The left ventricle has severely decreased function. The left ventricle demonstrates regional wall motion abnormalities (see scoring diagram/findings for description). Left ventricular diastolic parameters are indeterminate.  2. Right ventricular systolic function is normal. The right ventricular size is normal. There is normal pulmonary artery systolic pressure.  3. The mitral valve is grossly normal. Trivial mitral valve regurgitation. No evidence of mitral stenosis.  4. The aortic valve is grossly  normal. There is mild calcification of the aortic valve. Aortic valve regurgitation is not visualized. No aortic stenosis is present.  5. The inferior vena cava is normal in size with <50% respiratory variability, suggesting right atrial pressure of 8 mmHg. Comparison(s): No prior Echocardiogram. Conclusion(s)/Recommendation(s): Reduced EF with focal wall motion abnormalities. Communicated findings with Drs. Pool, Yates, and Icard. FINDINGS  Left Ventricle: Left ventricular ejection fraction, by estimation, is 25 to 30%. The left ventricle has severely decreased function. The left ventricle demonstrates regional wall motion abnormalities. The left ventricular internal cavity size was normal  in size. There is no left ventricular hypertrophy. Left ventricular diastolic parameters are indeterminate.  LV Wall Scoring: The entire septum is akinetic. The entire anterior wall, entire lateral wall, entire inferior wall, and apex are hypokinetic. Right Ventricle: The right ventricular size is normal. Right vetricular wall thickness was not well visualized. Right ventricular systolic function is normal. There is normal pulmonary artery systolic pressure. The tricuspid regurgitant velocity is 2.48 m/s, and with an assumed right atrial pressure of 8 mmHg, the estimated right ventricular systolic pressure is 11.9 mmHg. Left Atrium: Left atrial size was normal in size. Right Atrium: Right atrial size was normal in size. Pericardium: There is no evidence of pericardial effusion. Mitral Valve: The mitral valve is grossly normal. Trivial mitral valve regurgitation. No evidence of mitral valve stenosis. Tricuspid Valve: The tricuspid valve is grossly normal. Tricuspid valve regurgitation is trivial. Aortic Valve: The aortic valve is grossly normal. There is mild calcification of the aortic valve. Aortic valve regurgitation is not visualized. No aortic stenosis is present. Pulmonic Valve: The pulmonic valve was not well visualized.  Pulmonic valve regurgitation is not visualized. No evidence of pulmonic stenosis. Aorta: The aortic root, ascending aorta, aortic arch and descending aorta are all structurally normal, with no evidence of dilitation or obstruction. Venous: The inferior vena cava is normal in size with less than 50% respiratory variability, suggesting right atrial pressure of 8 mmHg. IAS/Shunts: The atrial septum is grossly normal.  LEFT VENTRICLE PLAX 2D LVIDd:         4.10 cm     Diastology LVIDs:         3.40 cm     LV e' medial:    4.24 cm/s LV PW:         1.10 cm     LV E/e' medial:  10.3 LV IVS:        0.70 cm     LV e' lateral:   5.87 cm/s LVOT diam:  1.80 cm     LV E/e' lateral: 7.4 LV SV:         25 LV SV Index:   14 LVOT Area:     2.54 cm  LV Volumes (MOD) LV vol d, MOD A2C: 46.3 ml LV vol d, MOD A4C: 68.7 ml LV vol s, MOD A2C: 34.6 ml LV vol s, MOD A4C: 50.5 ml LV SV MOD A2C:     11.7 ml LV SV MOD A4C:     68.7 ml LV SV MOD BP:      15.6 ml RIGHT VENTRICLE             IVC RV S prime:     21.80 cm/s  IVC diam: 1.40 cm TAPSE (M-mode): 2.2 cm LEFT ATRIUM             Index LA diam:        2.70 cm 1.49 cm/m LA Vol (A2C):   23.5 ml 12.95 ml/m LA Vol (A4C):   35.7 ml 19.67 ml/m LA Biplane Vol: 30.4 ml 16.75 ml/m  AORTIC VALVE LVOT Vmax:   68.40 cm/s LVOT Vmean:  45.900 cm/s LVOT VTI:    0.097 m  AORTA Ao Root diam: 3.00 cm Ao Asc diam:  3.10 cm MITRAL VALVE               TRICUSPID VALVE MV Area (PHT): 3.19 cm    TR Peak grad:   24.6 mmHg MV Decel Time: 238 msec    TR Vmax:        248.00 cm/s MV E velocity: 43.50 cm/s MV A velocity: 53.80 cm/s  SHUNTS MV E/A ratio:  0.81        Systemic VTI:  0.10 m                            Systemic Diam: 1.80 cm Buford Dresser MD Electronically signed by Buford Dresser MD Signature Date/Time: 10/13/2020/5:04:55 PM    Final    VAS [place] LOWER EXTREMITY VENOUS (DVT)  Result Date: 10/13/2020  Lower Venous DVT Study Indications: Edema.  Comparison Study: Previous RLE Duplex  02/2020- Negative Performing Technologist: Vonzell Schlatter  Examination Guidelines: A complete evaluation includes B-mode imaging, spectral Doppler, color Doppler, and power Doppler as needed of all accessible portions of each vessel. Bilateral testing is considered an integral part of a complete examination. Limited examinations for reoccurring indications may be performed as noted. The reflux portion of the exam is performed with the patient in reverse Trendelenburg.  +---------+---------------+---------+-----------+----------+--------------+ RIGHT    CompressibilityPhasicitySpontaneityPropertiesThrombus Aging +---------+---------------+---------+-----------+----------+--------------+ CFV      Full           Yes      Yes                                 +---------+---------------+---------+-----------+----------+--------------+ SFJ      Full                                                        +---------+---------------+---------+-----------+----------+--------------+ FV Prox  Full                                                        +---------+---------------+---------+-----------+----------+--------------+  FV Mid   Full                                                        +---------+---------------+---------+-----------+----------+--------------+ FV DistalFull                                                        +---------+---------------+---------+-----------+----------+--------------+ PFV      Full                                                        +---------+---------------+---------+-----------+----------+--------------+ POP      Full           Yes      Yes                                 +---------+---------------+---------+-----------+----------+--------------+ PTV      Full                                                        +---------+---------------+---------+-----------+----------+--------------+ PERO     Full                                                         +---------+---------------+---------+-----------+----------+--------------+   +---------+---------------+---------+-----------+----------+--------------+ LEFT     CompressibilityPhasicitySpontaneityPropertiesThrombus Aging +---------+---------------+---------+-----------+----------+--------------+ CFV      Full           Yes      Yes                                 +---------+---------------+---------+-----------+----------+--------------+ SFJ      Full                                                        +---------+---------------+---------+-----------+----------+--------------+ FV Prox  Full                                                        +---------+---------------+---------+-----------+----------+--------------+ FV Mid   Full                                                        +---------+---------------+---------+-----------+----------+--------------+  FV DistalFull                                                        +---------+---------------+---------+-----------+----------+--------------+ PFV      Full                                                        +---------+---------------+---------+-----------+----------+--------------+ POP      Full           Yes      Yes                                 +---------+---------------+---------+-----------+----------+--------------+ PTV      Full                                                        +---------+---------------+---------+-----------+----------+--------------+ PERO     Full                                                        +---------+---------------+---------+-----------+----------+--------------+     Summary: RIGHT: - There is no evidence of deep vein thrombosis in the lower extremity.  - No cystic structure found in the popliteal fossa.  LEFT: - There is no evidence of deep vein thrombosis in the lower extremity.  - No cystic  structure found in the popliteal fossa.  *See table(s) above for measurements and observations. Electronically signed by Harold Barban MD on 10/13/2020 at 5:35:09 PM.    Final

## 2020-10-14 NOTE — Progress Notes (Signed)
ANTICOAGULATION CONSULT NOTE - [place] for Heparin Indication: chest pain/ACS  Allergies  Allergen Reactions  . Sulfa Antibiotics Hives    Patient Measurements: Height: 5\' 7"  (170.2 cm) Weight: 70.3 kg (155 lb) IBW/kg (Calculated) : 66.1 Heparin Dosing Weight: 70.3 kg  Vital Signs: Temp: 99.4 F (37.4 C) (12/05 1600) Temp Source: Axillary (12/05 1600) BP: 105/61 (12/05 1800) Pulse Rate: 60 (12/05 1800)  Labs: Recent Labs    10/13/20 1513 10/13/20 1801 10/13/20 1902 10/13/20 2233 10/13/20 2303 10/13/20 2303 10/14/20 0306 10/14/20 0306 10/14/20 0345 10/14/20 1010 10/14/20 1245 10/14/20 1313 10/14/20 1550 10/14/20 1725  HGB 11.7*  --   --    < > 11.7*   < > 10.5*   < > 10.6*  --   --   --  9.9*  --   HCT 39.0  --   --    < > 36.6*   < > 31.0*  --  31.9*  --   --   --  29.1*  --   PLT 349   < >  --   --  354  --   --   --  319  --   --   --  301  --   LABPROT  --   --   --   --   --   --   --   --   --   --   --   --  18.5*  --   INR  --   --   --   --   --   --   --   --   --   --   --   --  1.6*  --   HEPARINUNFRC  --   --   --   --   --   --   --   --  0.72*  --   --  0.50  --  0.45  CREATININE 1.48*  --   --   --   --   --   --   --  1.81*  --   --   --  1.72*  --   TROPONINIHS 147*   < > 1,033*  --   --   --   --   --   --  1,860* 1,642*  --   --   --    < > = values in this interval not displayed.    Estimated Creatinine Clearance: 30.4 mL/min (A) (by C-G formula based on SCr of 1.72 mg/dL (H)).   Medical History: Past Medical History:  Diagnosis Date  . Anxiety   . Back pain   . CAD (coronary artery disease)    significant CAD by cardiac calcium scoring test  . CKD (chronic kidney disease), stage III ([place])    by serum creatinine is stable  . HTN (hypertension)    pt denies   . Hyperlipidemia   . Microhematuria   . PSA elevation 2014   4.75 February 2017, 6.9 in 2/18 and declines urologist eval or trial of antibiotics     Assessment: 61 YOM s/p recent L5-S1 decompression and fusion surgery on 09/28/20 readmitted 12/1 with back pain, imaging not concerning for wound infection or complications. The patient developed respiratory distress and required intubation earlier today. Now with bump in troponins and concern for ACS - pharmacy consulted to dose Heparin for anticoagulation.   Heparin level this evening remains therapeutic (  HL 0.45 << 0.5, goal of 0.3-0.5). No bleeding or issues noted per RN.   Goal of Therapy:  Heparin level 0.3-0.5 units/ml Monitor platelets by anticoagulation protocol: Yes   Plan:  - Continue Heparin at 600 units/hr - Will continue to monitor for any signs/symptoms of bleeding and will follow up with heparin level in the a.m.   Thank you for allowing pharmacy to be a part of this patient's care.  Alycia Rossetti, PharmD, BCPS Clinical Pharmacist Clinical phone for 10/14/2020: [phone] 10/14/2020 6:54 PM   **Pharmacist phone directory can now be found on [email or web address] ([email or web address]).  Listed under [place].

## 2020-10-15 ENCOUNTER — Encounter (HOSPITAL_COMMUNITY): Payer: Self-pay | Admitting: Cardiology

## 2020-10-15 ENCOUNTER — Inpatient Hospital Stay (HOSPITAL_COMMUNITY): Payer: Medicare Other

## 2020-10-15 ENCOUNTER — Encounter (HOSPITAL_COMMUNITY): Admission: AD | Disposition: E | Payer: Self-pay | Source: Home / Self Care | Attending: Neurosurgery

## 2020-10-15 DIAGNOSIS — R570 Cardiogenic shock: Secondary | ICD-10-CM

## 2020-10-15 DIAGNOSIS — A419 Sepsis, unspecified organism: Secondary | ICD-10-CM | POA: Diagnosis not present

## 2020-10-15 DIAGNOSIS — R0602 Shortness of breath: Secondary | ICD-10-CM

## 2020-10-15 DIAGNOSIS — R6521 Severe sepsis with septic shock: Secondary | ICD-10-CM | POA: Diagnosis not present

## 2020-10-15 DIAGNOSIS — E440 Moderate protein-calorie malnutrition: Secondary | ICD-10-CM | POA: Insufficient documentation

## 2020-10-15 DIAGNOSIS — J9601 Acute respiratory failure with hypoxia: Secondary | ICD-10-CM | POA: Diagnosis not present

## 2020-10-15 DIAGNOSIS — I5021 Acute systolic (congestive) heart failure: Secondary | ICD-10-CM

## 2020-10-15 HISTORY — PX: RIGHT HEART CATH: CATH118263

## 2020-10-15 LAB — CBC
HCT: 26.2 % — ABNORMAL LOW (ref 39.0–52.0)
Hemoglobin: 8.5 g/dL — ABNORMAL LOW (ref 13.0–17.0)
MCH: 30.2 pg (ref 26.0–34.0)
MCHC: 32.4 g/dL (ref 30.0–36.0)
MCV: 93.2 fL (ref 80.0–100.0)
Platelets: 255 10*3/uL (ref 150–400)
RBC: 2.81 MIL/uL — ABNORMAL LOW (ref 4.22–5.81)
RDW: 12.6 % (ref 11.5–15.5)
WBC: 9.8 10*3/uL (ref 4.0–10.5)
nRBC: 0 % (ref 0.0–0.2)

## 2020-10-15 LAB — CULTURE, RESPIRATORY W GRAM STAIN: Culture: NORMAL

## 2020-10-15 LAB — POCT I-STAT EG7
Acid-Base Excess: 5 mmol/L — ABNORMAL HIGH (ref 0.0–2.0)
Acid-Base Excess: 6 mmol/L — ABNORMAL HIGH (ref 0.0–2.0)
Bicarbonate: 29.5 mmol/L — ABNORMAL HIGH (ref 20.0–28.0)
Bicarbonate: 31.1 mmol/L — ABNORMAL HIGH (ref 20.0–28.0)
Calcium, Ion: 1.09 mmol/L — ABNORMAL LOW (ref 1.15–1.40)
Calcium, Ion: 1.1 mmol/L — ABNORMAL LOW (ref 1.15–1.40)
HCT: 23 % — ABNORMAL LOW (ref 39.0–52.0)
HCT: 23 % — ABNORMAL LOW (ref 39.0–52.0)
Hemoglobin: 7.8 g/dL — ABNORMAL LOW (ref 13.0–17.0)
Hemoglobin: 7.8 g/dL — ABNORMAL LOW (ref 13.0–17.0)
O2 Saturation: 53 %
O2 Saturation: 48 %
Potassium: 3.5 mmol/L (ref 3.5–5.1)
Potassium: 3.6 mmol/L (ref 3.5–5.1)
Sodium: 143 mmol/L (ref 135–145)
Sodium: 142 mmol/L (ref 135–145)
TCO2: 31 mmol/L (ref 22–32)
TCO2: 33 mmol/L — ABNORMAL HIGH (ref 22–32)
pCO2, Ven: 45.1 mmHg (ref 44.0–60.0)
pCO2, Ven: 46.7 mmHg (ref 44.0–60.0)
pH, Ven: 7.424 (ref 7.250–7.430)
pH, Ven: 7.432 — ABNORMAL HIGH (ref 7.250–7.430)
pO2, Ven: 28 mmHg — CL (ref 32.0–45.0)
pO2, Ven: 26 mmHg — CL (ref 32.0–45.0)

## 2020-10-15 LAB — POCT I-STAT 7, (LYTES, BLD GAS, ICA,H+H)
Acid-Base Excess: 5 mmol/L — ABNORMAL HIGH (ref 0.0–2.0)
Bicarbonate: 29.1 mmol/L — ABNORMAL HIGH (ref 20.0–28.0)
Calcium, Ion: 1.08 mmol/L — ABNORMAL LOW (ref 1.15–1.40)
HCT: 22 % — ABNORMAL LOW (ref 39.0–52.0)
Hemoglobin: 7.5 g/dL — ABNORMAL LOW (ref 13.0–17.0)
O2 Saturation: 99 %
Potassium: 3.5 mmol/L (ref 3.5–5.1)
Sodium: 142 mmol/L (ref 135–145)
TCO2: 30 mmol/L (ref 22–32)
pCO2 arterial: 40.5 mmHg (ref 32.0–48.0)
pH, Arterial: 7.465 — ABNORMAL HIGH (ref 7.350–7.450)
pO2, Arterial: 142 mmHg — ABNORMAL HIGH (ref 83.0–108.0)

## 2020-10-15 LAB — COMPREHENSIVE METABOLIC PANEL
ALT: 40 U/L (ref 0–44)
AST: 43 U/L — ABNORMAL HIGH (ref 15–41)
Albumin: 1.9 g/dL — ABNORMAL LOW (ref 3.5–5.0)
Alkaline Phosphatase: 58 U/L (ref 38–126)
Anion gap: 11 (ref 5–15)
BUN: 47 mg/dL — ABNORMAL HIGH (ref 8–23)
CO2: 25 mmol/L (ref 22–32)
Calcium: 7.4 mg/dL — ABNORMAL LOW (ref 8.9–10.3)
Chloride: 104 mmol/L (ref 98–111)
Creatinine, Ser: 1.53 mg/dL — ABNORMAL HIGH (ref 0.61–1.24)
GFR, Estimated: 45 mL/min — ABNORMAL LOW (ref >60)
Glucose, Bld: 252 mg/dL — ABNORMAL HIGH (ref 70–99)
Potassium: 3.5 mmol/L (ref 3.5–5.1)
Sodium: 140 mmol/L (ref 135–145)
Total Bilirubin: 1.1 mg/dL (ref 0.3–1.2)
Total Protein: 5 g/dL — ABNORMAL LOW (ref 6.5–8.1)

## 2020-10-15 LAB — GLUCOSE, CAPILLARY
Glucose-Capillary: 210 mg/dL — ABNORMAL HIGH (ref 70–99)
Glucose-Capillary: 197 mg/dL — ABNORMAL HIGH (ref 70–99)
Glucose-Capillary: 171 mg/dL — ABNORMAL HIGH (ref 70–99)
Glucose-Capillary: 171 mg/dL — ABNORMAL HIGH (ref 70–99)
Glucose-Capillary: 245 mg/dL — ABNORMAL HIGH (ref 70–99)

## 2020-10-15 LAB — HEPARIN LEVEL (UNFRACTIONATED): Heparin Unfractionated: 0.15 IU/mL — ABNORMAL LOW (ref 0.30–0.70)

## 2020-10-15 LAB — PROCALCITONIN: Procalcitonin: 83.56 ng/mL

## 2020-10-15 LAB — HEMOGLOBIN AND HEMATOCRIT, BLOOD
HCT: 25.1 % — ABNORMAL LOW (ref 39.0–52.0)
Hemoglobin: 8.1 g/dL — ABNORMAL LOW (ref 13.0–17.0)

## 2020-10-15 LAB — RESP PANEL BY RT-PCR (FLU A&B, COVID) ARPGX2
Influenza A by PCR: NEGATIVE
Influenza B by PCR: NEGATIVE
SARS Coronavirus 2 by RT PCR: NEGATIVE

## 2020-10-15 LAB — MAGNESIUM: Magnesium: 1.9 mg/dL (ref 1.7–2.4)

## 2020-10-15 LAB — PHOSPHORUS: Phosphorus: 2.7 mg/dL (ref 2.5–4.6)

## 2020-10-15 LAB — LACTIC ACID, PLASMA: Lactic Acid, Venous: 2.6 mmol/L (ref 0.5–1.9)

## 2020-10-15 LAB — MRSA PCR SCREENING: MRSA by PCR: NEGATIVE

## 2020-10-15 SURGERY — RIGHT HEART CATH

## 2020-10-15 MED ORDER — PROSOURCE TF PO LIQD
90.0000 mL | Freq: Two times a day (BID) | ORAL | Status: DC
  Administered 2020-10-15 – 2020-10-24 (×18): 90 mL
  Filled 2020-10-15 (×18): qty 90

## 2020-10-15 MED ORDER — SODIUM CHLORIDE 0.9 % IV SOLN
250.0000 mL | INTRAVENOUS | Status: DC | PRN
  Administered 2020-10-18: 250 mL via INTRAVENOUS

## 2020-10-15 MED ORDER — HYDROCODONE-ACETAMINOPHEN 5-325 MG PO TABS
1.0000 | ORAL_TABLET | ORAL | Status: DC
  Administered 2020-10-15 – 2020-10-16 (×4): 1
  Filled 2020-10-15 (×6): qty 1

## 2020-10-15 MED ORDER — MIDAZOLAM HCL 2 MG/2ML IJ SOLN
INTRAMUSCULAR | Status: AC
  Filled 2020-10-15: qty 2

## 2020-10-15 MED ORDER — SODIUM CHLORIDE 0.9 % IV SOLN: INTRAVENOUS | Status: DC

## 2020-10-15 MED ORDER — METHYLNALTREXONE BROMIDE 12 MG/0.6ML ~~LOC~~ SOLN
12.0000 mg | Freq: Once | SUBCUTANEOUS | Status: AC
  Administered 2020-10-15: 12 mg via SUBCUTANEOUS
  Filled 2020-10-15: qty 0.6

## 2020-10-15 MED ORDER — ROSUVASTATIN CALCIUM 20 MG PO TABS
20.0000 mg | ORAL_TABLET | Freq: Every day | ORAL | Status: DC
  Administered 2020-10-16 – 2020-10-30 (×15): 20 mg
  Filled 2020-10-15 (×15): qty 1

## 2020-10-15 MED ORDER — DIAZEPAM 5 MG PO TABS
2.5000 mg | ORAL_TABLET | Freq: Four times a day (QID) | ORAL | Status: DC
  Administered 2020-10-15 – 2020-10-20 (×17): 2.5 mg
  Filled 2020-10-15 (×19): qty 1

## 2020-10-15 MED ORDER — SODIUM CHLORIDE 0.9 % IV SOLN
100.0000 mg | Freq: Two times a day (BID) | INTRAVENOUS | Status: DC
  Administered 2020-10-15 (×2): 100 mg via INTRAVENOUS
  Filled 2020-10-15 (×4): qty 100

## 2020-10-15 MED ORDER — MAGNESIUM SULFATE IN D5W 1-5 GM/100ML-% IV SOLN
1.0000 g | Freq: Once | INTRAVENOUS | Status: DC
  Filled 2020-10-15: qty 100

## 2020-10-15 MED ORDER — INSULIN GLARGINE 100 UNIT/ML ~~LOC~~ SOLN
10.0000 [IU] | Freq: Every day | SUBCUTANEOUS | Status: DC
  Administered 2020-10-15: 10 [IU] via SUBCUTANEOUS
  Filled 2020-10-15 (×4): qty 0.1

## 2020-10-15 MED ORDER — POTASSIUM CHLORIDE 20 MEQ PO PACK
40.0000 meq | PACK | Freq: Once | ORAL | Status: AC
  Administered 2020-10-15: 40 meq
  Filled 2020-10-15: qty 2

## 2020-10-15 MED ORDER — LIDOCAINE HCL (PF) 1 % IJ SOLN
INTRAMUSCULAR | Status: AC
  Filled 2020-10-15: qty 30

## 2020-10-15 MED ORDER — MIDAZOLAM HCL 2 MG/2ML IJ SOLN
INTRAMUSCULAR | Status: DC | PRN
  Administered 2020-10-15: 2 mg via INTRAVENOUS

## 2020-10-15 MED ORDER — VITAL 1.5 CAL PO LIQD
1000.0000 mL | ORAL | Status: DC
  Administered 2020-10-15 – 2020-10-18 (×3): 1000 mL
  Filled 2020-10-15: qty 1000

## 2020-10-15 MED ORDER — DIAZEPAM 5 MG PO TABS: 2.5000 mg | ORAL_TABLET | Freq: Four times a day (QID) | ORAL | Status: DC

## 2020-10-15 MED ORDER — SODIUM CHLORIDE 0.9% FLUSH
3.0000 mL | Freq: Two times a day (BID) | INTRAVENOUS | Status: DC
  Administered 2020-10-15 – 2020-10-27 (×23): 3 mL via INTRAVENOUS

## 2020-10-15 MED ORDER — LIDOCAINE HCL (PF) 1 % IJ SOLN
INTRAMUSCULAR | Status: DC | PRN
  Administered 2020-10-15: 3 mL

## 2020-10-15 MED ORDER — FENTANYL CITRATE (PF) 100 MCG/2ML IJ SOLN
50.0000 ug | Freq: Once | INTRAMUSCULAR | Status: AC
  Administered 2020-10-15: 50 ug via INTRAVENOUS

## 2020-10-15 MED ORDER — BISACODYL 10 MG RE SUPP
10.0000 mg | Freq: Every day | RECTAL | Status: AC
  Administered 2020-10-15 – 2020-10-16 (×2): 10 mg via RECTAL
  Filled 2020-10-15 (×3): qty 1

## 2020-10-15 MED ORDER — MAGNESIUM SULFATE 2 GM/50ML IV SOLN
2.0000 g | Freq: Once | INTRAVENOUS | Status: AC
  Administered 2020-10-15: 2 g via INTRAVENOUS
  Filled 2020-10-15: qty 50

## 2020-10-15 MED ORDER — PREGABALIN 75 MG PO CAPS
75.0000 mg | ORAL_CAPSULE | Freq: Two times a day (BID) | ORAL | Status: DC
  Administered 2020-10-15 – 2020-10-22 (×16): 75 mg
  Filled 2020-10-15 (×16): qty 1

## 2020-10-15 MED ORDER — SODIUM CHLORIDE 0.9% FLUSH
3.0000 mL | INTRAVENOUS | Status: DC | PRN
  Administered 2020-10-18: 3 mL via INTRAVENOUS

## 2020-10-15 SURGICAL SUPPLY — 7 items
CATH SWAN GANZ VIP 7.5F (CATHETERS) ×2 IMPLANT
KIT MICROPUNCTURE NIT STIFF (SHEATH) ×2 IMPLANT
PACK CARDIAC CATHETERIZATION (CUSTOM PROCEDURE TRAY) ×2 IMPLANT
PROTECTION STATION PRESSURIZED (MISCELLANEOUS) ×3
SHEATH PINNACLE 8F 10CM (SHEATH) ×2 IMPLANT
SLEEVE REPOSITIONING LENGTH 30 (MISCELLANEOUS) ×2 IMPLANT
STATION PROTECTION PRESSURIZED (MISCELLANEOUS) IMPLANT

## 2020-10-15 NOTE — Progress Notes (Signed)
Initial Nutrition Assessment  DOCUMENTATION CODES:   Non-severe (moderate) malnutrition in context of chronic illness  INTERVENTION:   Initiate tube feeding via OG tube: Vital 1.5 at 45 ml/h (1080 ml per day) Prosource TF 90 ml BID  Provides 1780 kcal, 117 gm protein, 820 ml free water daily   NUTRITION DIAGNOSIS:   Moderate Malnutrition related to chronic illness as evidenced by moderate fat depletion, severe muscle depletion, moderate muscle depletion.  GOAL:   Patient will meet greater than or equal to 90% of their needs  MONITOR:   TF tolerance, Labs  REASON FOR ASSESSMENT:   Consult, Ventilator Enteral/tube feeding initiation and management  ASSESSMENT:   Pt with PMH of HTN, HLD, stage III CKD, CAD, who recently was admitted 09/29/20 for L5-S1 decompression and fusion and discharged home now readmitted for poorly controlled back and bilateral lower extremity pain and PNA.   No nutrition history available at this time. Per Neurosurgery notes pt with debilitation prior to surgery.  Suspect poor nutrition after surgery due to uncontrolled pain.  Cardiology and heart failure teams consulted. Possible cardiogenic vs septic shock, per ECHO EF 25-30%  12/1 admission 12/4 intubated possibly from flash pulmonary edema; OG tube tip in stomach  12/5 TF started per protocol - Vital High Protein @ 40 ml/hr with 45 ml ProSource TF BID; provides: 1040 kcal and 106 grams protein  Patient is currently intubated on ventilator support MV: 10.4 L/min Temp (24hrs), Avg:99.6 F (37.6 C), Min:99 F (37.2 C), Max:100.7 F (38.2 C)  Medications reviewed and include: SSI, lantus, miralax  Precedex Mag sulfate x 1 Vasopressin  Labs reviewed:  CBG's: 197-171  NUTRITION - FOCUSED PHYSICAL EXAM:    Most Recent Value  Orbital Region Moderate depletion  Upper Arm Region Moderate depletion  Thoracic and Lumbar Region Moderate depletion  Buccal Region Moderate depletion  Temple  Region Moderate depletion  Clavicle Bone Region Mild depletion  Clavicle and Acromion Bone Region Moderate depletion  Scapular Bone Region Unable to assess  Dorsal Hand Unable to assess  Patellar Region Severe depletion  Anterior Thigh Region Severe depletion  Posterior Calf Region Severe depletion  Edema (RD Assessment) Mild  Hair Reviewed  Eyes Unable to assess  Mouth Unable to assess  Skin Reviewed  Nails Unable to assess       Diet Order:   Diet Order            Diet NPO time specified  Diet effective now                 EDUCATION NEEDS:   No education needs have been identified at this time  Skin:     Last BM:  12/3  Height:   Ht Readings from Last 1 Encounters:  10/12/20 5\' 7"  (1.702 m)    Weight:   Wt Readings from Last 1 Encounters:  10/10/2020 69.5 kg    Ideal Body Weight:  67.2 kg  BMI:  Body mass index is 24 kg/m.  Estimated Nutritional Needs:   Kcal:  1800  Protein:  105-120 grams  Fluid:  >1.8 L/day  Lockie Pares., RD, LDN, CNSC See AMiON for contact information

## 2020-10-15 NOTE — Progress Notes (Signed)
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 He had no operative complications and was discharged home. He returned to [facility] 10 days post-op due to fatigue, weakness, and poorly controlled back pain. He was also found to have sick contact and he was admit to neurosurgery service for pain control, IV hydration and possible dispo to CIR.   Past Medical History  CAD, CKD3, HTN, [place] [facility] Events   11/06/2020 Admisson  Consults:  Neurosurg  Procedures:  10/13/20 Intubation 12/5 - cVL R Femoral 12/5 bronch Significant Diagnostic Tests:  10/11/20 Chest X-ray, minimal left basilar atelectasis 12/3 LE venous doppler: neg 12/4 echo: LVEF 25-30%  Micro Data:  11/06/2020 COVID, Flu neg 12/4:  MRSA PCR neg 12/4 Bronch tracheal aspirate - GNR, GPC growing 12/4 blood: ngtd  Antimicrobials:  Vanc 12/5 >. 12/5 (MRSA PCR neg and has AKI) Cefepime 12.5 >>  Interim history/subjective:    10/21/2020 on sbt with higher support this am. But great improvement on vent settings. Cardiology taking for cath today and will move to [place]. Diuresis per cards. Stopped heparin with drop in hgb and bleeding from cvc.   12/5:- remains intubated on vent. 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 Holding off ace inhibito but continuing iv lasix. Holding off cath    Febrile to 100.4 with high wbc. Both normalized today/  CVP !3  On fent gtt, precedex gtt, levophed gtt (PCT  < 0.1), bic gtt, heparin gtt  Creat worse 1.8 but bic normal on bic gtt. CXR  worse this morning    Objective   Blood pressure 123/66, pulse (!) 55, temperature (!) 100.7 F (38.2 C), temperature source Axillary, resp. rate 20, height 5\' 7"  (1.702 m), weight 69.5 kg, SpO2 93 %. CVP:  [12 mmHg-13 mmHg] 12 mmHg  Vent Mode: CPAP;PSV FiO2 (%):  [50 %-90 %] 50 % Set Rate:  [20 bmp] 20 bmp Vt Set:  [520 mL] 520 mL PEEP:  [7 cmH20-10 cmH20] 8 cmH20 Pressure Support:  [8 cmH20] 8 cmH20 Plateau Pressure:  [1 cmH20-22 cmH20] 20 cmH20   Intake/Output Summary (Last 24 hours) at 10/30/2020 7026 Last data filed at 10/22/2020 0700 Gross per 24 hour  Intake 2070.35 ml  Output 1825 ml  Net 245.35 ml   Filed Weights   10/12/20 1413 10/15/20 0500  Weight: 70.3 kg 69.5 kg    General Appearance:  Well developed male sedated and intubated unresponsive on vent Head:  Normocephalic, without obvious abnormality, atraumatic Eyes:  PERRL, sclera anicteric Ears:  Normal external ear canals, both ears Throat:  ETT TUBE Neck:  Supple,  No enlargement/tenderness/nodules Lungs: Clear to auscultation bilaterally, Ventilator   Synchrony - yes with 50% and 8peep Heart:  S1 and S2 normal, no murmur  Abdomen:  Soft, no masses, no organomegaly Genitalia / Rectal:  deferred Extremities:  Extremities, moves all 4 spontaneously, not to command, trace edema in ble Skin:  intact in exposed areas  Neurologic:  Sedated unresponsive, spontaneously moving in bed  Resolved Hospital Problem list     Assessment & Plan:   L5-S1 DJD s/p laminectomy/decompression Post-op day 16. Per neurosurgery, no operative complications. Difficulty with post-op deconditioning and pain control. - On sedation for intubation - Per neurosurgery Chronic pain:  -resume home meds.    Acute hypoxic respiratory failure due to acute pulm edema +/- HCAP 11/04/2020 : tolerated sbt on 8/5 this am. Will repeat post cath prior to extubation.  Plan  - PRVC  - goal pulse ox > 88%  - VAP bundle -cxr personally  reviewed by me, bilateral infiltrates.    Circulatory shock 10/13/2020 - sepsis v Cardiac vs  precedex Plan -vasopressin for MAP > 65 -off levophed Prolonged qt:  -mag >2 K >4 goals -avoid prolonging agents  Acute systolic CHF - new onset 40/9  - 11/09/2020 - seen by Dr Einar Gip. Takasubo high on ddx Plan  - lasix per cards  - no ace inhibitor and beta blocker for the moment - statin per cards -stopped heparin gtt 2/2 bleeding -for cath today.  Possible HCAP 11/07/2020  tmax 100.7 Plan  - await cultures  - cont off vanc (AKI and MRSA PCR neg)  - continue cefepime x 5 days  T2DM: a1c 7.7 -ssi -adding lantus  AKI Plan - off vanc - monitor esp now lasix on board by cards -renal function stable to somewhat improved with Cr 1.5    lactic acidosis -Somewhat improved at 2.6, trending in correct direction at least   Anxiety on valium at home Chronic pain from back Sedation needs on ventilator  10/22/2020 - following commands. In chronic pain during wua. On fent gtt and precedex gtt  Plan  - RASS goal 0 to -3 and vent synchrony  - fent gtt - precedex gtt - fent prn - versed prn -resume home meds.     Anemia of Critical illness Normocytic anemia Bleeding at catheter site femoral.  Stopped heparin -follow hgb later today.  -transfuse <7    Best practice (evaluated daily)   Diet: TF Pain/Anxiety/Delirium protocol (if indicated): Fentanyl and precedex gtt VAP protocol (if indicated): Oral care DVT prophylaxis: SCD GI prophylaxis: PPI Glucose control: SSI Mobility: N/A Code Status: Full Disposition: ICU     Multi-Disciplinary Goals of Care Discussion Date of Discussion 11/07/2020 and is Day 4 since admit  Primary service for patient NSGY  Location of discussion Bedside 4n25  Family and Staff present RN Bernardo Heater, MD Dr Chase Caller, wife of patient and daughter. Patient unable to participate due to vent  Summary of discussion Updated. Continue full  active care and full code. Wife worried that patient under lot of stress due to pain and is concerned he has taken a major setback  Followup goals of care due by 10/21/20  Misc comments if any     Updated pt's children at bedside. 12/6   LABS    PULMONARY Recent Labs  Lab 10/13/20 1645 10/13/20 2233 10/14/20 0306 10/14/20 1545  PHART 7.226* 7.263* 7.374 7.454*  PCO2ART 46.8 38.8 42.7 40.6  PO2ART 60.2* 44* 60* 89.3  HCO3 18.7* 17.5* 24.7 27.9  TCO2  --  19* 26  --   O2SAT 85.7 74.0 88.0 96.9    CBC Recent Labs  Lab 10/14/20 0345 10/14/20 1550 10/21/2020 0547  HGB 10.6* 9.9* 8.5*  HCT 31.9* 29.1* 26.2*  WBC 10.3 12.6* 9.8  PLT 319 301 255    COAGULATION Recent Labs  Lab 10/14/20 1550  INR 1.6*    CARDIAC  No results for input(s): TROPONINI in the last 168 hours. No results for input(s): PROBNP in the last 168 hours.   CHEMISTRY Recent Labs  Lab 11/08/2020 1759 10/27/2020 1759 10/13/20 1513 10/13/20 1513 10/13/20 2233 10/13/20 2233 10/14/20 0306 10/14/20 0306 10/14/20 0345 10/14/20 0345 10/14/20 1550 10/13/2020 0547  NA 139   < > 141   < > 140  --  143  --  142  --  141 140  K 4.0   < > 4.6   < > 4.8   < > 4.2   < > 4.2   < > 3.4* 3.5  CL 103  --  111  --   --   --   --   --  105  --  102 104  CO2 24  --  18*  --   --   --   --   --  22  --  26 25  GLUCOSE 148*  --  259*  --   --   --   --   --  221*  --  213* 252*  BUN 22  --  41*  --   --   --   --   --  46*  --  42* 47*  CREATININE 1.32*  --  1.48*  --   --   --   --   --  1.81*  --  1.72* 1.53*  CALCIUM 8.5*  --  7.5*  --   --   --   --   --  7.0*  --  6.7* 7.4*  MG  --   --   --   --   --   --   --   --   --   --   --  1.9  PHOS  --   --   --   --   --   --   --   --   --   --   --  2.7   < > = values in this interval not displayed.   Estimated Creatinine Clearance: 34.2 mL/min (A) (by C-G formula based on SCr of 1.53 mg/dL (H)).   LIVER Recent Labs  Lab 10/21/2020 1759 10/13/20 1513  10/14/20 0345 10/14/20 1550 10/18/2020 0547  AST 29 56* 43* 42* 43*  ALT 24 43 38 36 40  ALKPHOS 74 81 62 60 58  BILITOT 0.6 0.7 1.3* 1.4* 1.1  PROT 6.2* 5.9* 5.0* 5.0* 5.0*  ALBUMIN 3.3* 2.9* 2.4* 2.2* 1.9*  INR  --   --   --  1.6*  --      INFECTIOUS Recent Labs  Lab 10/13/20 1513 10/13/20 1801 10/14/20 1050 10/14/20 1245 10/14/20 1550 11/01/2020 0547  LATICACIDVEN 4.1*   < > 4.6*  --  2.8* 2.6*  PROCALCITON <0.10  --   --  108.79  --  83.56   < > = values in this interval not displayed.     ENDOCRINE CBG (last 3)  Recent Labs    10/14/20 2328 10/20/2020 0332 11/01/2020 0759  GLUCAP 237* 210* 197*         IMAGING x48h  - image(s) personally visualized  -   highlighted in bold DG Chest 1 View  Result Date: 10/13/2020 CLINICAL DATA:  Hypoxemia EXAM: CHEST  1 VIEW COMPARISON:  Radiograph 10/13/2020 FINDINGS: Endotracheal tube terminates in the mid trachea, 5.5 cm from the carina. Transesophageal tube tip and side port are distal to the  GE junction, terminating below the margins of imaging. Telemetry leads and external support devices overlie the chest. Redemonstration of the heterogeneous opacity in both lungs,, right greater than left with some slight interval clearing in the right mid lung, possibly related to improving lung volumes when compared to prior study. No visible pneumothorax or effusion. The aorta is calcified. The remaining cardiomediastinal contours are unremarkable. No acute osseous or soft tissue abnormality. IMPRESSION: 1. Persistent bilateral, opacities, right greater than left. Slight interval clearing in the right mid lung, possibly related to improving lung volumes when compared to prior study. 2. Support devices as above. Electronically Signed   By: Lovena Le M.D.   On: 10/13/2020 23:00   DG Abd 1 View  Result Date: 10/13/2020 CLINICAL DATA:  OG tube placement EXAM: ABDOMEN - 1 VIEW COMPARISON:  Radiograph 10/13/2020 FINDINGS: Transesophageal tube  tip terminates near the gastric antrum/duodenal bulb. Side port in the distal gastric body, beyond the GE junction. Paucity of abdominal bowel gas is nonspecific without evidence of high-grade bowel obstruction. Multifocal patchy opacities are present in the lung bases, more coalescent in the right lower lung. Trace effusions. Cardiomediastinal contours are stable from prior. Degenerative changes present in the spine. Lower lumbar fusion hardware partially visualized. IMPRESSION: 1. Transesophageal tube tip terminates near the gastric antrum/duodenal bulb. Side port in the distal gastric body, beyond the GE junction. 2. Multifocal pneumonia. Electronically Signed   By: Lovena Le M.D.   On: 10/13/2020 17:37   DG CHEST PORT 1 VIEW  Result Date: 10/27/2020 CLINICAL DATA:  Respiratory failure. EXAM: PORTABLE CHEST 1 VIEW COMPARISON:  Chest x-ray 10/14/2020 FINDINGS: The endotracheal tube and NG tubes in good position, unchanged. Persistent bilateral lung infiltrates. No pleural effusion or pneumothorax. IMPRESSION: 1. Stable support apparatus. 2. Persistent bilateral lung infiltrates. Electronically Signed   By: Marijo Sanes M.D.   On: 10/11/2020 07:21   DG Chest Port 1 View  Result Date: 10/14/2020 CLINICAL DATA:  Shortness of breath EXAM: PORTABLE CHEST 1 VIEW COMPARISON:  10/13/2020 FINDINGS: Cardiac shadow is stable. Gastric catheter and endotracheal tube are again seen and stable. The lungs again demonstrate diffuse airspace opacity with some increasing consolidation on the right. No sizable effusion is seen. No bony abnormality is noted. IMPRESSION: Patchy airspace opacities bilaterally right greater than left with some increasing consolidation particularly in the right upper lobe. Electronically Signed   By: Inez Catalina M.D.   On: 10/14/2020 10:57   DG CHEST PORT 1 VIEW  Result Date: 10/13/2020 CLINICAL DATA:  Follow-up lung infiltrates. Endotracheal tube and nasal/orogastric tube placement.  EXAM: PORTABLE CHEST 1 VIEW COMPARISON:  10/13/2020 at 1:47 p.m. FINDINGS: Endotracheal tube tip lies at the carina, but does not extend towards either mainstem bronchus. Consider retracting 1-2 cm. Nasal/orogastric tube passes well below the diaphragm into the stomach. Bilateral airspace lung opacities are without significant change from the earlier exam allowing for differences in radiographic technique. IMPRESSION: 1. Endotracheal tube tip projects at the carinal. Consider retracting 1-2 cm for more optimal positioning. 2. Well-positioned nasal/orogastric tube. 3. No change in the bilateral airspace lung opacities consistent with multifocal pneumonia. Electronically Signed   By: Lajean Manes M.D.   On: 10/13/2020 15:50   DG CHEST PORT 1 VIEW  Result Date: 10/13/2020 CLINICAL DATA:  Oxygen desaturation EXAM: PORTABLE CHEST 1 VIEW COMPARISON:  10/11/2020 FINDINGS: Cardiac shadow is stable. The lungs are well aerated bilaterally. Airspace opacities are noted right considerably greater than left consistent with multifocal pneumonia.  No bony abnormality is seen. No other focal abnormality is noted. IMPRESSION: Bilateral airspace opacities consistent with multifocal pneumonia. Electronically Signed   By: Inez Catalina M.D.   On: 10/13/2020 13:55   ECHOCARDIOGRAM COMPLETE  Result Date: 10/13/2020    ECHOCARDIOGRAM REPORT   Patient Name:   GILBERT MANOLIS Date of Exam: 10/13/2020 Medical Rec #:  [id number]      Height:       67.0 in Accession #:    [id number]     Weight:       155.0 lb Date of Birth:  10-Mar-1935     BSA:          1.815 m Patient Age:    79 years       BP:           133/73 mmHg Patient Gender: M              HR:           102 bpm. Exam Location:  Inpatient Procedure: 2D Echo STAT ECHO Indications:    acute coronary syndrome  History:        Patient has no prior history of Echocardiogram examinations.  Sonographer:    Johny Chess Referring Phys: [place] Comments: Echo  performed with patient supine and on artificial respirator. Image acquisition challenging due to uncooperative patient. IMPRESSIONS  1. Left ventricular ejection fraction, by estimation, is 25 to 30%. The left ventricle has severely decreased function. The left ventricle demonstrates regional wall motion abnormalities (see scoring diagram/findings for description). Left ventricular diastolic parameters are indeterminate.  2. Right ventricular systolic function is normal. The right ventricular size is normal. There is normal pulmonary artery systolic pressure.  3. The mitral valve is grossly normal. Trivial mitral valve regurgitation. No evidence of mitral stenosis.  4. The aortic valve is grossly normal. There is mild calcification of the aortic valve. Aortic valve regurgitation is not visualized. No aortic stenosis is present.  5. The inferior vena cava is normal in size with <50% respiratory variability, suggesting right atrial pressure of 8 mmHg. Comparison(s): No prior Echocardiogram. Conclusion(s)/Recommendation(s): Reduced EF with focal wall motion abnormalities. Communicated findings with Drs. Pool, Yates, and Icard. FINDINGS  Left Ventricle: Left ventricular ejection fraction, by estimation, is 25 to 30%. The left ventricle has severely decreased function. The left ventricle demonstrates regional wall motion abnormalities. The left ventricular internal cavity size was normal  in size. There is no left ventricular hypertrophy. Left ventricular diastolic parameters are indeterminate.  LV Wall Scoring: The entire septum is akinetic. The entire anterior wall, entire lateral wall, entire inferior wall, and apex are hypokinetic. Right Ventricle: The right ventricular size is normal. Right vetricular wall thickness was not well visualized. Right ventricular systolic function is normal. There is normal pulmonary artery systolic pressure. The tricuspid regurgitant velocity is 2.48 m/s, and with an assumed right  atrial pressure of 8 mmHg, the estimated right ventricular systolic pressure is 41.9 mmHg. Left Atrium: Left atrial size was normal in size. Right Atrium: Right atrial size was normal in size. Pericardium: There is no evidence of pericardial effusion. Mitral Valve: The mitral valve is grossly normal. Trivial mitral valve regurgitation. No evidence of mitral valve stenosis. Tricuspid Valve: The tricuspid valve is grossly normal. Tricuspid valve regurgitation is trivial. Aortic Valve: The aortic valve is grossly normal. There is mild calcification of the aortic valve. Aortic valve regurgitation is not visualized. No aortic stenosis is present. Pulmonic Valve:  The pulmonic valve was not well visualized. Pulmonic valve regurgitation is not visualized. No evidence of pulmonic stenosis. Aorta: The aortic root, ascending aorta, aortic arch and descending aorta are all structurally normal, with no evidence of dilitation or obstruction. Venous: The inferior vena cava is normal in size with less than 50% respiratory variability, suggesting right atrial pressure of 8 mmHg. IAS/Shunts: The atrial septum is grossly normal.  LEFT VENTRICLE PLAX 2D LVIDd:         4.10 cm     Diastology LVIDs:         3.40 cm     LV e' medial:    4.24 cm/s LV PW:         1.10 cm     LV E/e' medial:  10.3 LV IVS:        0.70 cm     LV e' lateral:   5.87 cm/s LVOT diam:     1.80 cm     LV E/e' lateral: 7.4 LV SV:         25 LV SV Index:   14 LVOT Area:     2.54 cm  LV Volumes (MOD) LV vol d, MOD A2C: 46.3 ml LV vol d, MOD A4C: 68.7 ml LV vol s, MOD A2C: 34.6 ml LV vol s, MOD A4C: 50.5 ml LV SV MOD A2C:     11.7 ml LV SV MOD A4C:     68.7 ml LV SV MOD BP:      15.6 ml RIGHT VENTRICLE             IVC RV S prime:     21.80 cm/s  IVC diam: 1.40 cm TAPSE (M-mode): 2.2 cm LEFT ATRIUM             Index LA diam:        2.70 cm 1.49 cm/m LA Vol (A2C):   23.5 ml 12.95 ml/m LA Vol (A4C):   35.7 ml 19.67 ml/m LA Biplane Vol: 30.4 ml 16.75 ml/m  AORTIC VALVE  LVOT Vmax:   68.40 cm/s LVOT Vmean:  45.900 cm/s LVOT VTI:    0.097 m  AORTA Ao Root diam: 3.00 cm Ao Asc diam:  3.10 cm MITRAL VALVE               TRICUSPID VALVE MV Area (PHT): 3.19 cm    TR Peak grad:   24.6 mmHg MV Decel Time: 238 msec    TR Vmax:        248.00 cm/s MV E velocity: 43.50 cm/s MV A velocity: 53.80 cm/s  SHUNTS MV E/A ratio:  0.81        Systemic VTI:  0.10 m                            Systemic Diam: 1.80 cm Buford Dresser MD Electronically signed by Buford Dresser MD Signature Date/Time: 10/13/2020/5:04:55 PM    Final    Critical care time: The patient is critically ill with multiple organ systems failure and requires high complexity decision making for assessment and support, frequent evaluation and titration of therapies, application of advanced monitoring technologies and extensive interpretation of multiple databases.  Critical care time 38 mins. This represents my time independent of the NPs time taking care of the pt. This is excluding procedures.    [facility] 10/14/2020, 9:39 AM

## 2020-10-15 NOTE — Progress Notes (Signed)
OT Cancellation Note  Patient Details Name: Dennis Carey MRN: [id number] DOB: 1935-03-14   Cancelled Treatment:    Reason Eval/Treat Not Completed: Medical issues which prohibited therapy; noted pt now transferred to [place] (heart cath this AM). Will follow up as able/as pt medically appropriate.  Lou Cal, OT Acute Rehabilitation Services Pager [phone] Office [phone]   Raymondo Band 10/25/2020, 12:19 PM

## 2020-10-15 NOTE — Progress Notes (Signed)
Patient transported to cath lab on the ventilator with no problems.

## 2020-10-15 NOTE — Progress Notes (Signed)
No new events or problems.  Patient recently status post right heart catheterization with evidence of mild to moderate diminished cardiac function but no overt right heart failure or pulmonary hypertension.  Patient's pulmonary status has improved significantly.  Now down to 50% FiO2 and 8 of PEEP.  Chest x-ray continues to show bilateral infiltrates right greater than left overall no significant worsening of his picture.  Patient continues to perfuse reasonably well.  His kidney function has is stable with good urine output.  He is off pressors.  Of concern is some degree of worsening anemia which is likely iatrogenic secondary to frequent blood draws and critical illness.  No evidence of active bleeding anywhere.  Patient remains sedated with pinpoint pupils which is likely pharmacologic and not due to any type of neurologic injury.  At this point the patient is doing about as well as could be hoped.  His pulmonary situation is improving.  Etiology of his pulmonary failure is unclear, infectious versus flash edema from obstruction versus cardiogenic shock.  Continue with supportive care.  Patient making some progress with weaning.  His cardiac situation is depressed but there is no evidence of any profound heart failure or ischemic territorial injury.

## 2020-10-15 NOTE — Progress Notes (Signed)
Subjective:  Intubated and sedated.  Intake/Output from previous day:  I/O last 3 completed shifts: In: 3760.2 [I.V.:2686.2; NG/GT:240; IV Piggyback:833.9] Out: 3125 [Urine:3125] No intake/output data recorded.  Blood pressure 123/66, pulse (!) 55, temperature (!) 100.7 F (38.2 C), temperature source Axillary, resp. rate 20, height 5' 7" (1.702 m), weight 69.5 kg, SpO2 93 %. Physical Exam Constitutional:      Interventions: He is sedated and intubated.  Cardiovascular:     Rate and Rhythm: Regular rhythm. Bradycardia present.     Pulses: Intact distal pulses.     Heart sounds: Normal heart sounds. No murmur heard.  No gallop.      Comments: No leg edema, no JVD. Pulmonary:     Effort: Pulmonary effort is normal. He is intubated.     Breath sounds: Rhonchi (bilateral diffuse) present.  Abdominal:     General: There is distension.     Palpations: Abdomen is soft.     Comments: Bowel sounds sluggish  Musculoskeletal:        General: No swelling or deformity.    Lab Results: BMP BNP (last 3 results) Recent Labs    10/13/20 1513  BNP 589.0*    ProBNP (last 3 results) No results for input(s): PROBNP in the last 8760 hours. BMP Latest Ref Rng & Units 11/06/2020 10/14/2020 10/14/2020  Glucose 70 - 99 mg/dL 252(H) 213(H) 221(H)  BUN 8 - 23 mg/dL 47(H) 42(H) 46(H)  Creatinine 0.61 - 1.24 mg/dL 1.53(H) 1.72(H) 1.81(H)  Sodium 135 - 145 mmol/L 140 141 142  Potassium 3.5 - 5.1 mmol/L 3.5 3.4(L) 4.2  Chloride 98 - 111 mmol/L 104 102 105  CO2 22 - 32 mmol/L 25 26 22  Calcium 8.9 - 10.3 mg/dL 7.4(L) 6.7(L) 7.0(L)   Hepatic Function Latest Ref Rng & Units 10/17/2020 10/14/2020 10/14/2020  Total Protein 6.5 - 8.1 g/dL 5.0(L) 5.0(L) 5.0(L)  Albumin 3.5 - 5.0 g/dL 1.9(L) 2.2(L) 2.4(L)  AST 15 - 41 U/L 43(H) 42(H) 43(H)  ALT 0 - 44 U/L 40 36 38  Alk Phosphatase 38 - 126 U/L 58 60 62  Total Bilirubin 0.3 - 1.2 mg/dL 1.1 1.4(H) 1.3(H)   CBC Latest Ref Rng & Units 10/20/2020 10/14/2020  10/14/2020  WBC 4.0 - 10.5 K/uL 9.8 12.6(H) 10.3  Hemoglobin 13.0 - 17.0 g/dL 8.5(L) 9.9(L) 10.6(L)  Hematocrit 39 - 52 % 26.2(L) 29.1(L) 31.9(L)  Platelets 150 - 400 K/uL 255 301 319   Lipid Panel  No results found for: CHOL, TRIG, HDL, CHOLHDL, VLDL, LDLCALC, LDLDIRECT Cardiac Panel (last 3 results) No results for input(s): CKTOTAL, CKMB, TROPONINI, RELINDX in the last 72 hours.  HEMOGLOBIN A1C Lab Results  Component Value Date   HGBA1C 7.7 (H) 10/12/2020   MPG 174.29 10/12/2020   TSH No results for input(s): TSH in the last 8760 hours. Imaging: Imaging results have been reviewed  DG Chest 1 View  10/13/2020 CLINICAL DATA:  Hypoxemia EXAM: CHEST  1 VIEW COMPARISON:  Radiograph 10/13/2020 FINDINGS: Endotracheal tube terminates in the mid trachea, 5.5 cm from the carina. Transesophageal tube tip and side port are distal to the GE junction, terminating below the margins of imaging. Telemetry leads and external support devices overlie the chest. Redemonstration of the heterogeneous opacity in both lungs,, right greater than left with some slight interval clearing in the right mid lung, possibly related to improving lung volumes when compared to prior study. No visible pneumothorax or effusion. The aorta is calcified. The remaining cardiomediastinal contours are unremarkable. No   acute osseous or soft tissue abnormality.  IMPRESSION: 1. Persistent bilateral, opacities, right greater than left. Slight interval clearing in the right mid lung, possibly related to improving lung volumes when compared to prior study. 2. Support devices as above. Electronically Signed   By: Caperton  DeHay M.D.   On: 10/13/2020 23:00    DG Chest 1 View  10/22/2020  1. Stable support apparatus. 2. Persistent bilateral lung infiltrates.   Lower Extremity Venous Duplex  10/13/2020: Summary: RIGHT: - There is no evidence of deep vein thrombosis in the lower extremity.  - No cystic structure found in the popliteal  fossa.  LEFT: - There is no evidence of deep vein thrombosis in the lower extremity.  - No cystic structure found in the popliteal fossa.  *See table(s) above for measurements and observations. Electronically signed by Vance Brabham MD on 10/13/2020 at 5:35:09 PM. Cardiac Studies:   Echocardiogram 10/13/2020:  1. Left ventricular ejection fraction, by estimation, is 25 to 30%. The left ventricle has severely decreased function. The left ventricle demonstrates regional wall motion abnormalities (see scoring diagram/findings for description). Left ventricular  diastolic parameters are indeterminate. 2. Right ventricular systolic function is normal. The right ventricular size is normal. There is normal pulmonary artery systolic pressure. 3. The mitral valve is grossly normal. Trivial mitral valve regurgitation. No evidence of mitral stenosis. 4. The aortic valve is grossly normal. There is mild calcification of the aortic valve. Aortic valve regurgitation is not visualized. No aortic stenosis is present. 5. The inferior vena cava is normal in size with <50% respiratory variability, suggesting right atrial pressure of 8 mmHg.   EKG:  EKG 10/14/2020: Normal sinus rhythm at rate of 69 bpm, normal axis, poor R wave progression, cannot exclude anteroseptal infarct old.  T wave abnormality, anterolateral ischemia.  Compared to April 19, 2020, poor R wave progression and T wave abnormality new.  Compared to yesterday, tibial abnormality In the anterolateral leads is new.  Assessment    1.  Flash pulmonary edema/Cardiogenic shock 2.  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  Echocardiogram personally reviewed, mid to distal anterior, anteroapical, mid to distal inferior and inferoapical akinesis.  Troponin leak out of proportion to severe LV systolic dysfunction. 3.  Lung infiltrates, chest x-ray repeated, right lung infiltrate persist, left side is cleared, still suspect pulmonary edema.  May have  a component of aspiration, ARDS/ pneumonia- being managed by pulmonary critical care. 4.  Hyperlipidemia 5.  Elevated D-dimer 6.  Abdominal distention and decreased bowel sounds   Scheduled Meds: . aspirin  81 mg Per Tube Daily  . chlorhexidine gluconate (MEDLINE KIT)  15 mL Mouth Rinse BID  . Chlorhexidine Gluconate Cloth  6 each Topical Daily  . feeding supplement (PROSource TF)  45 mL Per Tube BID  . feeding supplement (VITAL HIGH PROTEIN)  1,000 mL Per Tube Q24H  . insulin aspart  0-15 Units Subcutaneous Q4H  . mouth rinse  15 mL Mouth Rinse 10 times per day  . pantoprazole sodium  40 mg Per Tube Q1200  . polyethylene glycol  17 g Per Tube Daily  . rosuvastatin  10 mg Per Tube Daily   Continuous Infusions: . sodium chloride    . ceFEPime (MAXIPIME) IV Stopped (10/14/20 2328)  . dexmedetomidine (PRECEDEX) IV infusion 0.9 mcg/kg/hr (11/02/2020 0700)  . fentaNYL infusion INTRAVENOUS 150 mcg/hr (11/04/2020 0700)  . norepinephrine (LEVOPHED) Adult infusion Stopped (10/17/2020 0458)  . vasopressin 0.01 Units/min (11/01/2020 0813)   PRN Meds:.Place/Maintain arterial line **  AND** sodium chloride, acetaminophen (TYLENOL) oral liquid 160 mg/5 mL, midazolam, ondansetron **OR** ondansetron (ZOFRAN) IV  Plan:   Patient continues to have significant lung infiltrate, still requiring pressor support, clinical picture is still slightly confusing and hence best option would be to proceed with right heart catheterization.  As most of his issues are medical and cardiac, would prefer to transfer him to to heart for management of cardiogenic shock and multiorgan failure.  Discussed with pulmonary critical care.  I discussed with patient's daughter and son at the bedside regarding proceeding with right heart catheterization, risks of pneumothorax, infection, bleeding discussed and they are agreeable.  I will not perform left heart catheterization in view of acute renal insufficiency and risk of contrast  nephropathy.  He has mild distention of his abdomen, however bowel sounds are also sluggish and could be a part of multisystem failure.  35 minutes of critical care in evaluation of complex medical issues and multiorgan failure.    Lilliemae Fruge, MD, FACC 11/06/2020, 9:20 AM Office: [phone] Pager: [phone]  

## 2020-10-15 NOTE — Interval H&P Note (Signed)
History and Physical Interval Note:  11/06/2020 10:52 AM  Dennis Carey  has presented today for surgery, with the diagnosis of cardiogenic shock.  The various methods of treatment have been discussed with the patient and family. After consideration of risks, benefits and other options for treatment, the patient has consented to  Procedure(s): RIGHT HEART CATH (N/A) as a surgical intervention.  The patient's history has been reviewed, patient examined, no change in status, stable for surgery.  I have reviewed the patient's chart and labs.  Questions were answered to the patient's satisfaction.     Adrian Prows

## 2020-10-15 NOTE — Consult Note (Addendum)
Advanced Heart Failure Team Consult Note   Primary Physician: Dennis Battles, MD PCP-Cardiologist:  No primary care provider on file.  Reason for Consultation: Heart Failure   HPI:    Dennis Carey is seen today for evaluation of heart failure at the request of Dr Dennis Carey.   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 Due to ongoing back pain he underwent a L5-S1 laminectomy with fusion 09/28/2020 and later discharged.   On 10/18/2020 he presented pain, fatigue, and weakness. SARS negative on admit.  Admitted for pain control and hydration. On 12/4 he developed acute shortness of breath and hypotension. Possible sick exposure. Placed on Bipap with poor response. CCM consulted and intubated. CXR concering for bilateral pulmonary edema and lung infiltrates. lower extremity dopplers negative.   HS Trop [id number]. Cardiology consulted. ECHO showed reduced EF 25-20%, RV normal and possible Takotsubo. Started on IV lasix. Yesterday IV lasix stopped.   Had RHC with low filling pressure, PA sat 53%, CO 4. Remains intubated on precedex + fentanyl. On vasopressin 0.03 units. SARs 2 negative today.   Blood Cx : No growth to date Respiratory Cx: No growth   Had RHC today  RA 6 PA 24/11 (17)  PA sat 53% PCWP  Fick CO 4.1 Cardiac Index 2.26  PVR 3.1  Thermo CO 3.5 Cardiac Index 1.9  SVR 1467   Echo EF 25-20% RV normal   Review of Systems: [y] = yes, '[ ]'  = no Patient is encephalopathic and or intubated. Therefore history has been obtained from chart review.   . General: Weight gain '[ ]' ; Weight loss '[ ]' ; Anorexia '[ ]' ; Fatigue '[ ]' ; Fever '[ ]' ; Chills '[ ]' ; Weakness '[ ]'   . Cardiac: Chest pain/pressure '[ ]' ; Resting SOB '[ ]' ; Exertional SOB '[ ]' ; Orthopnea '[ ]' ; Pedal Edema '[ ]' ; Palpitations '[ ]' ; Syncope '[ ]' ; Presyncope '[ ]' ; Paroxysmal nocturnal dyspnea'[ ]'   . Pulmonary: Cough '[ ]' ; Wheezing'[ ]' ;  Hemoptysis'[ ]' ; Sputum '[ ]' ; Snoring '[ ]'   . GI: Vomiting'[ ]' ; Dysphagia'[ ]' ; Melena'[ ]' ; Hematochezia '[ ]' ; Heartburn'[ ]' ; Abdominal pain '[ ]' ; Constipation '[ ]' ; Diarrhea '[ ]' ; BRBPR '[ ]'   . GU: Hematuria'[ ]' ; Dysuria '[ ]' ; Nocturia'[ ]'   . Vascular: Pain in legs with walking '[ ]' ; Pain in feet with lying flat '[ ]' ; Non-healing sores '[ ]' ; Stroke '[ ]' ; TIA '[ ]' ; Slurred speech '[ ]' ;  . Neuro: Headaches'[ ]' ; Vertigo'[ ]' ; Seizures'[ ]' ; Paresthesias'[ ]' ;Blurred vision '[ ]' ; Diplopia '[ ]' ; Vision changes '[ ]'   . Ortho/Skin: Arthritis '[ ]' ; Joint pain '[ ]' ; Muscle pain '[ ]' ; Joint swelling '[ ]' ; Back Pain '[ ]' ; Rash '[ ]'   . Psych: Depression'[ ]' ; Anxiety'[ ]'   . Heme: Bleeding problems '[ ]' ; Clotting disorders '[ ]' ; Anemia '[ ]'   . Endocrine: Diabetes '[ ]' ; Thyroid dysfunction'[ ]'   Home Medications Prior to Admission medications   Medication Sig Start Date End Date Taking? Authorizing Provider  aspirin 81 MG tablet Take 81 mg by mouth daily.   Yes [provider]  diazepam (VALIUM) 5 MG tablet Take 1 tablet (5 mg total) by mouth every 6 (six) hours as needed for muscle spasms. Patient taking differently: Take 2.5 mg by mouth every 6 (six) hours.  09/29/20  Yes Pool, Dennis Mussel, MD  docusate sodium (COLACE) 100 MG capsule Take 100 mg by mouth 3 (three)  times daily with meals.    Yes [provider]  ezetimibe (ZETIA) 10 MG tablet Take 10 mg by mouth at bedtime.    Yes [provider]  HYDROcodone-acetaminophen (NORCO/VICODIN) 5-325 MG tablet Take 1 tablet by mouth every 4 (four) hours.  06/06/20  Yes [provider]  polyethylene glycol powder (GLYCOLAX/MIRALAX) 17 GM/SCOOP powder Take 17 g by mouth See admin instructions. Mix 17 grams of powder into 4 ounces of water and drink every morning   Yes [provider]  pregabalin (LYRICA) 75 MG capsule Take 75 mg by mouth 2 (two) times daily. 05/23/20  Yes [provider]  rosuvastatin (CRESTOR) 20 MG tablet Take 20 mg by mouth at bedtime.    Yes  [provider]    Past Medical History: Past Medical History:  Diagnosis Date  . Anxiety   . Back pain   . CAD (coronary artery disease)    significant CAD by cardiac calcium scoring test  . CKD (chronic kidney disease), stage III ([place])    by serum creatinine is stable  . HTN (hypertension)    pt denies   . Hyperlipidemia   . Microhematuria   . PSA elevation 2014   4.75 February 2017, 6.9 in 2/18 and declines urologist eval or trial of antibiotics    Past Surgical History: Past Surgical History:  Procedure Laterality Date  . dental implants     3 of them 2008  . LUMBAR LAMINECTOMY/DECOMPRESSION MICRODISCECTOMY Right 04/20/2020   Procedure: Laminectomy and Foraminotomy - right - Lumbar five-Sacral one;  Surgeon: Dennis Larsson, MD;  Location: [facility];  Service: Neurosurgery;  Laterality: Right;  . LUMBAR LAMINECTOMY/DECOMPRESSION MICRODISCECTOMY Right 06/08/2020   Procedure: Right Lumbar Two-Three Laminectomy and Foraminotomy;  Surgeon: Dennis Larsson, MD;  Location: [facility];  Service: Neurosurgery;  Laterality: Right;  . submucous resection nose     1956    Family History: Family History  Problem Relation Age of Onset  . Colon cancer Neg Hx   . Migraines Neg Hx     Social History: Social History   Socioeconomic History  . Marital status: Married    Spouse name: Not on file  . Number of children: 3  . Years of education: MD   . Highest education level: Professional school degree (e.g., MD, DDS, DVM, JD)  Occupational History  . Occupation: retired Psychologist, sport and exercise  Tobacco Use  . Smoking status: Never Smoker  . Smokeless tobacco: Never Used  Vaping Use  . Vaping Use: Never used  Substance and Sexual Activity  . Alcohol use: Yes    Comment: occasional wine   . Drug use: Never  . Sexual activity: Not on file  Other Topics Concern  . Not on file  Social History Narrative   Lives at home with his wife Flora    Right handed   Caffeine: daily soda    Social  Determinants of Health   Financial Resource Strain:   . Difficulty of Paying Living Expenses: Not on file  Food Insecurity:   . Worried About Charity fundraiser in the Last Year: Not on file  . Ran Out of Food in the Last Year: Not on file  Transportation Needs:   . Lack of Transportation (Medical): Not on file  . Lack of Transportation (Non-Medical): Not on file  Physical Activity:   . Days of Exercise per Week: Not on file  . Minutes of Exercise per Session: Not on file  Stress:   .  Feeling of Stress : Not on file  Social Connections:   . Frequency of Communication with Friends and Family: Not on file  . Frequency of Social Gatherings with Friends and Family: Not on file  . Attends Religious Services: Not on file  . Active Member of Clubs or Organizations: Not on file  . Attends Archivist Meetings: Not on file  . Marital Status: Not on file    Allergies:  Allergies  Allergen Reactions  . Sulfa Antibiotics Hives    Objective:    Vital Signs:   Temp:  [99 F (37.2 C)-100.7 F (38.2 C)] 100.7 F (38.2 C) (12/06 0800) Pulse Rate:  [0-66] 56 (12/06 1138) Resp:  [0-54] 19 (12/06 1138) BP: (91-140)/(55-80) 111/63 (12/06 1138) SpO2:  [0 %-100 %] 100 % (12/06 1217) Arterial Line BP: (106-162)/(49-73) 138/49 (12/06 1000) FiO2 (%):  [40 %-90 %] 50 % (12/06 1217) Weight:  [69.5 kg] 69.5 kg (12/06 0500) Last BM Date: 10/12/20  Weight change: Filed Weights   10/12/20 1413 10/19/2020 0500  Weight: 70.3 kg 69.5 kg    Intake/Output:   Intake/Output Summary (Last 24 hours) at 11/05/2020 1328 Last data filed at 10/25/2020 1000 Gross per 24 hour  Intake 2320.85 ml  Output 1150 ml  Net 1170.85 ml      Physical Exam    General:  Intubated/sedated  HEENT:ETT Neck: supple. JVP does not appear elevated. Carotids 2+ bilat; no bruits. No lymphadenopathy or thyromegaly appreciated. RIJ swan  Cor: PMI nondisplaced. Regular rate & rhythm. No rubs, gallops or  murmurs. Lungs: clear Abdomen: soft, nontender, nondistended. No hepatosplenomegaly. No bruits or masses. Good bowel sounds. Extremities: cool.no cyanosis, clubbing, rash, edema Neuro: sedated on vent.   Telemetry  Sinus Brady 50s  EKG    SR 69 bpm   Labs   Basic Metabolic Panel: Recent Labs  Lab 10/21/2020 1759 11/08/2020 1759 10/13/20 1513 10/13/20 2233 10/14/20 0345 10/14/20 1550 10/23/2020 0547 10/14/2020 1116 10/22/2020 1122  NA 139   < > 141   < > 142 141 140 143 142  K 4.0   < > 4.6   < > 4.2 3.4* 3.5 3.5 3.5  CL 103  --  111  --  105 102 104  --   --   CO2 24  --  18*  --  '22 26 25  ' --   --   GLUCOSE 148*  --  259*  --  221* 213* 252*  --   --   BUN 22  --  41*  --  46* 42* 47*  --   --   CREATININE 1.32*  --  1.48*  --  1.81* 1.72* 1.53*  --   --   CALCIUM 8.5*   < > 7.5*  --  7.0* 6.7* 7.4*  --   --   MG  --   --   --   --   --   --  1.9  --   --   PHOS  --   --   --   --   --   --  2.7  --   --    < > = values in this interval not displayed.    Liver Function Tests: Recent Labs  Lab 10/16/2020 1759 10/13/20 1513 10/14/20 0345 10/14/20 1550 11/07/2020 0547  AST 29 56* 43* 42* 43*  ALT 24 43 38 36 40  ALKPHOS 74 81 62 60 58  BILITOT 0.6 0.7 1.3* 1.4*  1.1  PROT 6.2* 5.9* 5.0* 5.0* 5.0*  ALBUMIN 3.3* 2.9* 2.4* 2.2* 1.9*   No results for input(s): LIPASE, AMYLASE in the last 168 hours. No results for input(s): AMMONIA in the last 168 hours.  CBC: Recent Labs  Lab 11/07/2020 1759 11/03/2020 1759 10/13/20 1513 10/13/20 2233 10/13/20 2303 10/14/20 0306 10/14/20 0345 10/14/20 1550 11/05/2020 0547 10/20/2020 1116 11/01/2020 1122  WBC 6.1   < > 16.7*  --  7.6  --  10.3 12.6* 9.8  --   --   NEUTROABS 3.5  --   --   --   --   --  8.0*  --   --   --   --   HGB 9.8*   < > 11.7*   < > 11.7*   < > 10.6* 9.9* 8.5* 7.8* 7.5*  HCT 29.0*   < > 39.0   < > 36.6*   < > 31.9* 29.1* 26.2* 23.0* 22.0*  MCV 91.2   < > 98.2  --  93.8  --  91.4 89.8 93.2  --   --   PLT 255   < > 349   --  354  --  319 301 255  --   --    < > = values in this interval not displayed.    Cardiac Enzymes: No results for input(s): CKTOTAL, CKMB, CKMBINDEX, TROPONINI in the last 168 hours.  BNP: BNP (last 3 results) Recent Labs    10/13/20 1513  BNP 589.0*    ProBNP (last 3 results) No results for input(s): PROBNP in the last 8760 hours.   CBG: Recent Labs  Lab 10/14/20 1940 10/14/20 2328 10/28/2020 0332 10/14/2020 0759 10/24/2020 1324  GLUCAP 224* 237* 210* 197* 171*    Coagulation Studies: Recent Labs    10/14/20 1550  LABPROT 18.5*  INR 1.6*     Imaging   CARDIAC CATHETERIZATION  Result Date: 11/01/2020 Right heart catheterization 10/23/2020: RA 8/6, mean 6 mmHg, RA saturation 48%. RV 24/6, EDP 8 mmHg. PA 22/11, mean 17 mmHg.  PA saturation 53%. PW 14/13, mean 9 mmHg.  Aortic saturation was 99%. Cardiac output by Fick was 4.09 and by thermodilution was 3.51 with cardiac index of 2.26 and 1.94 respectively. SVR 1467 dynes per second, PVR 156 dynes per second.  QP/QS 1.00. Impression: Normal right heart pressure.  Decreased cardiac index, consistent with cardiomyopathy.  However patient does not appear to be in pulmonary edema from cardiogenic cause. I will repeat COVID -19 test as well just as a precaution. Will discuss with PCCM.  DG CHEST PORT 1 VIEW  Result Date: 11/07/2020 CLINICAL DATA:  Respiratory failure. EXAM: PORTABLE CHEST 1 VIEW COMPARISON:  Chest x-ray 10/14/2020 FINDINGS: The endotracheal tube and NG tubes in good position, unchanged. Persistent bilateral lung infiltrates. No pleural effusion or pneumothorax. IMPRESSION: 1. Stable support apparatus. 2. Persistent bilateral lung infiltrates. Electronically Signed   By: Marijo Sanes M.D.   On: 10/11/2020 07:21      Medications:     Current Medications: . aspirin  81 mg Per Tube Daily  . chlorhexidine gluconate (MEDLINE KIT)  15 mL Mouth Rinse BID  . Chlorhexidine Gluconate Cloth  6 each Topical Daily   . diazepam  2.5 mg Per Tube Q6H  . feeding supplement (PROSource TF)  45 mL Per Tube BID  . feeding supplement (VITAL HIGH PROTEIN)  1,000 mL Per Tube Q24H  . HYDROcodone-acetaminophen  1 tablet Per Tube Q4H  . insulin aspart  0-15 Units Subcutaneous Q4H  . insulin glargine  10 Units Subcutaneous QHS  . mouth rinse  15 mL Mouth Rinse 10 times per day  . pantoprazole sodium  40 mg Per Tube Q1200  . polyethylene glycol  17 g Per Tube Daily  . potassium chloride  40 mEq Per Tube Once  . pregabalin  75 mg Per Tube BID  . [START ON 10/16/2020] rosuvastatin  20 mg Per Tube Daily  . sodium chloride flush  3 mL Intravenous Q12H     Infusions: . sodium chloride    . ceFEPime (MAXIPIME) IV 200 mL/hr at 10/21/2020 1000  . dexmedetomidine (PRECEDEX) IV infusion 0.7 mcg/kg/hr (11/06/2020 1000)  . doxycycline (VIBRAMYCIN) IV 100 mg (10/12/2020 1306)  . fentaNYL infusion INTRAVENOUS 100 mcg/hr (10/28/2020 1000)  . magnesium sulfate bolus IVPB    . 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 Due to ongoing back pain he underwent a L5-S1 laminectomy with fusion 09/28/2020 and later discharged. On 10/12/2020 he presented pain, fatigue, and weakness. SARS negative on admit.  Admitted for pain control and hydration.  Assessment/Plan   1. Acute Hypoxemic Respiratory Failure  -Developed acute respiratory failure on 12/4 and required intubation.  -CXR with pulmonary edema + infiltrates on 12/4. Today Xray with persistent infiltrates.  - Remains sedated/intubated.   2. Shock--> Cardiogenic versus Septic versus combination  Lactic Acid 3.6>4.6 >2.8  Echo- reduced EF with EF 25-30% and WMA. RV normal.  - On vasopressin 0.03 units.  -On antimicrobial coverage for possible HCAP  -Blood CX- NGTD  Sputum Cx- no growth.   3. Acute Systolic  Heart Failure  -As above Echo with EF 25-30% . No previous ECHO. No previous cath.  -Hs Trop [phone]. -RHC today with low filling pressures after diuresis and low index 1.9. SVR 1467  Down the road may need LHC to further assess coronaries.  - Hold lasix for now.  - Unable to add GDMT due to shock.   4. Back Pain--> S/P L5-S1 laminectomy with fusion 09/28/2020  Per Neurosugery  5. AKI Creatinine trending down  1.3>1.4>1.8>1.7>1.5    Length of Stay: 5  Amy Clegg, NP  10/27/2020, 1:28 PM  Advanced Heart Failure Team Pager [phone] (M-F; 7a - 4p)  Please contact [facility] for night-coverage after hours (4p -7a ) and weekends on [email or web address]   Agree with above.   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 Developed flash pulmonary edema and apparently shock several weeks after lumbar laminectomy.   CXR with asymmetric airspace disease. R>L. BNP 589. hstrop 1,057 -> 1, 642. PCT 109 -> 84 Lactic Acid 3.6>4.6 >2.8   Echo with EF 25-30% with anterior WMA. ECG with anterior Qs.  RHC today with low volume status and marginal output.   General:  Elderly male sedated on vent  HEENT: normal + ETT Neck: RIJ swan Carotids 2+ bilat; no bruits. No lymphadenopathy or thryomegaly appreciated. Cor: PMI nondisplaced. Regular rate & rhythm. No rubs, gallops or murmurs. Lungs: coarse Abdomen: soft, nontender, nondistended. No hepatosplenomegaly. No bruits or masses. Good bowel sounds. Extremities: no cyanosis, clubbing, rash, edema Neuro: intubated sedated  Difficult case. Suspect combination of septic and cardiogenic shock. Troponin moderately elevated but flat. Possibility of Tako-tsubo/septic CM has been raised and seems to fit clinically however ECG also concerning  for anterior MI.   Agree with current management. Hold diuretics. Will follow swan numbers carefully. May need inotropes. Keep MAP > 65 to support renal function. Will need coronary angiography prior to  d/c. f further pressors needed would use norepi.   CRITICAL CARE Performed by: Glori Bickers  Total critical care time: 35 minutes  Critical care time was exclusive of separately billable procedures and treating other patients.  Critical care was necessary to treat or prevent imminent or life-threatening deterioration.  Critical care was time spent personally by me (independent of midlevel providers or residents) on the following activities: development of treatment plan with patient and/or surrogate as well as nursing, discussions with consultants, evaluation of patient's response to treatment, examination of patient, obtaining history from patient or surrogate, ordering and performing treatments and interventions, ordering and review of laboratory studies, ordering and review of radiographic studies, pulse oximetry and re-evaluation of patient's condition.  Glori Bickers, MD  6:50 PM

## 2020-10-15 NOTE — H&P (View-Only) (Signed)
Subjective:  Intubated and sedated.  Intake/Output from previous day:  I/O last 3 completed shifts: In: 3760.2 [I.V.:2686.2; NG/GT:240; IV Piggyback:833.9] Out: 3125 [Urine:3125] No intake/output data recorded.  Blood pressure 123/66, pulse (!) 55, temperature (!) 100.7 F (38.2 C), temperature source Axillary, resp. rate 20, height _0  (1.702 m), weight 69.5 kg, SpO2 93 %. Physical Exam Constitutional:      Interventions: He is sedated and intubated.  Cardiovascular:     Rate and Rhythm: Regular rhythm. Bradycardia present.     Pulses: Intact distal pulses.     Heart sounds: Normal heart sounds. No murmur heard.  No gallop.      Comments: No leg edema, no JVD. Pulmonary:     Effort: Pulmonary effort is normal. He is intubated.     Breath sounds: Rhonchi (bilateral diffuse) present.  Abdominal:     General: There is distension.     Palpations: Abdomen is soft.     Comments: Bowel sounds sluggish  Musculoskeletal:        General: No swelling or deformity.    Lab Results: BMP BNP (last 3 results) Recent Labs    10/13/20 1513  BNP 589.0*    ProBNP (last 3 results) No results for input(s): PROBNP in the last 8760 hours. BMP Latest Ref Rng & Units 11/06/2020 10/14/2020 10/14/2020  Glucose 70 - 99 mg/dL 252(H) 213(H) 221(H)  BUN 8 - 23 mg/dL 47(H) 42(H) 46(H)  Creatinine 0.61 - 1.24 mg/dL 1.53(H) 1.72(H) 1.81(H)  Sodium 135 - 145 mmol/L 140 141 142  Potassium 3.5 - 5.1 mmol/L 3.5 3.4(L) 4.2  Chloride 98 - 111 mmol/L 104 102 105  CO2 22 - 32 mmol/L _1 Calcium 8.9 - 10.3 mg/dL 7.4(L) 6.7(L) 7.0(L)   Hepatic Function Latest Ref Rng & Units 10/26/2020 10/14/2020 10/14/2020  Total Protein 6.5 - 8.1 g/dL 5.0(L) 5.0(L) 5.0(L)  Albumin 3.5 - 5.0 g/dL 1.9(L) 2.2(L) 2.4(L)  AST 15 - 41 U/L 43(H) 42(H) 43(H)  ALT 0 - 44 U/L 40 36 38  Alk Phosphatase 38 - 126 U/L 58 60 62  Total Bilirubin 0.3 - 1.2 mg/dL 1.1 1.4(H) 1.3(H)   CBC Latest Ref Rng & Units 10/18/2020 10/14/2020  10/14/2020  WBC 4.0 - 10.5 K/uL 9.8 12.6(H) 10.3  Hemoglobin 13.0 - 17.0 g/dL 8.5(L) 9.9(L) 10.6(L)  Hematocrit 39 - 52 % 26.2(L) 29.1(L) 31.9(L)  Platelets 150 - 400 K/uL 255 301 319   Lipid Panel  No results found for: CHOL, TRIG, HDL, CHOLHDL, VLDL, LDLCALC, LDLDIRECT Cardiac Panel (last 3 results) No results for input(s): CKTOTAL, CKMB, TROPONINI, RELINDX in the last 72 hours.  HEMOGLOBIN A1C Lab Results  Component Value Date   HGBA1C 7.7 (H) 10/12/2020   MPG 174.29 10/12/2020   TSH No results for input(s): TSH in the last 8760 hours. Imaging: Imaging results have been reviewed  DG Chest 1 View  10/13/2020 CLINICAL DATA:  Hypoxemia EXAM: CHEST  1 VIEW COMPARISON:  Radiograph 10/13/2020 FINDINGS: Endotracheal tube terminates in the mid trachea, 5.5 cm from the carina. Transesophageal tube tip and side port are distal to the GE junction, terminating below the margins of imaging. Telemetry leads and external support devices overlie the chest. Redemonstration of the heterogeneous opacity in both lungs,, right greater than left with some slight interval clearing in the right mid lung, possibly related to improving lung volumes when compared to prior study. No visible pneumothorax or effusion. The aorta is calcified. The remaining cardiomediastinal contours are unremarkable. No  acute osseous or soft tissue abnormality.  IMPRESSION: 1. Persistent bilateral, opacities, right greater than left. Slight interval clearing in the right mid lung, possibly related to improving lung volumes when compared to prior study. 2. Support devices as above. Electronically Signed   By: Lovena Le M.D.   On: 10/13/2020 23:00    DG Chest 1 View  10/14/2020  1. Stable support apparatus. 2. Persistent bilateral lung infiltrates.   Lower Extremity Venous Duplex  10/13/2020: Summary: RIGHT: - There is no evidence of deep vein thrombosis in the lower extremity.  - No cystic structure found in the popliteal  fossa.  LEFT: - There is no evidence of deep vein thrombosis in the lower extremity.  - No cystic structure found in the popliteal fossa.  *See table(s) above for measurements and observations. Electronically signed by Harold Barban MD on 10/13/2020 at 5:35:09 PM. Cardiac Studies:   Echocardiogram 10/13/2020:  1. Left ventricular ejection fraction, by estimation, is 25 to 30%. The left ventricle has severely decreased function. The left ventricle demonstrates regional wall motion abnormalities (see scoring diagram/findings for description). Left ventricular  diastolic parameters are indeterminate. 2. Right ventricular systolic function is normal. The right ventricular size is normal. There is normal pulmonary artery systolic pressure. 3. The mitral valve is grossly normal. Trivial mitral valve regurgitation. No evidence of mitral stenosis. 4. The aortic valve is grossly normal. There is mild calcification of the aortic valve. Aortic valve regurgitation is not visualized. No aortic stenosis is present. 5. The inferior vena cava is normal in size with <50% respiratory variability, suggesting right atrial pressure of 8 mmHg.   EKG:  EKG 10/14/2020: Normal sinus rhythm at rate of 69 bpm, normal axis, poor R wave progression, cannot exclude anteroseptal infarct old.  T wave abnormality, anterolateral ischemia.  Compared to April 19, 2020, poor R wave progression and T wave abnormality new.  Compared to yesterday, tibial abnormality In the anterolateral leads is new.  Assessment    1.  Flash pulmonary edema/Cardiogenic shock 2.  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  Echocardiogram personally reviewed, mid to distal anterior, anteroapical, mid to distal inferior and inferoapical akinesis.  Troponin leak out of proportion to severe LV systolic dysfunction. 3.  Lung infiltrates, chest x-ray repeated, right lung infiltrate persist, left side is cleared, still suspect pulmonary edema.  May have  a component of aspiration, ARDS/ pneumonia- being managed by pulmonary critical care. 4.  Hyperlipidemia 5.  Elevated D-dimer 6.  Abdominal distention and decreased bowel sounds   Scheduled Meds: . aspirin  81 mg Per Tube Daily  . chlorhexidine gluconate (MEDLINE KIT)  15 mL Mouth Rinse BID  . Chlorhexidine Gluconate Cloth  6 each Topical Daily  . feeding supplement (PROSource TF)  45 mL Per Tube BID  . feeding supplement (VITAL HIGH PROTEIN)  1,000 mL Per Tube Q24H  . insulin aspart  0-15 Units Subcutaneous Q4H  . mouth rinse  15 mL Mouth Rinse 10 times per day  . pantoprazole sodium  40 mg Per Tube Q1200  . polyethylene glycol  17 g Per Tube Daily  . rosuvastatin  10 mg Per Tube Daily   Continuous Infusions: . sodium chloride    . ceFEPime (MAXIPIME) IV Stopped (10/14/20 2328)  . dexmedetomidine (PRECEDEX) IV infusion 0.9 mcg/kg/hr (11/01/2020 0700)  . fentaNYL infusion INTRAVENOUS 150 mcg/hr (10/11/2020 0700)  . norepinephrine (LEVOPHED) Adult infusion Stopped (10/14/2020 0458)  . vasopressin 0.01 Units/min (10/22/2020 0813)   PRN Meds:.Place/Maintain arterial line **  AND** sodium chloride, acetaminophen (TYLENOL) oral liquid 160 mg/5 mL, midazolam, ondansetron **OR** ondansetron (ZOFRAN) IV  Plan:   Patient continues to have significant lung infiltrate, still requiring pressor support, clinical picture is still slightly confusing and hence best option would be to proceed with right heart catheterization.  As most of his issues are medical and cardiac, would prefer to transfer him to to heart for management of cardiogenic shock and multiorgan failure.  Discussed with pulmonary critical care.  I discussed with patient's daughter and son at the bedside regarding proceeding with right heart catheterization, risks of pneumothorax, infection, bleeding discussed and they are agreeable.  I will not perform left heart catheterization in view of acute renal insufficiency and risk of contrast  nephropathy.  He has mild distention of his abdomen, however bowel sounds are also sluggish and could be a part of multisystem failure.  35 minutes of critical care in evaluation of complex medical issues and multiorgan failure.    Adrian Prows, MD, [facility] 11/01/2020, 9:20 AM Office: [phone] Pager: [phone]

## 2020-10-15 NOTE — Progress Notes (Signed)
Patient transported to 2H06 from [place] lab without complications. RN at bedside.

## 2020-10-16 ENCOUNTER — Inpatient Hospital Stay (HOSPITAL_COMMUNITY): Payer: Medicare Other

## 2020-10-16 DIAGNOSIS — R570 Cardiogenic shock: Secondary | ICD-10-CM | POA: Diagnosis not present

## 2020-10-16 DIAGNOSIS — J9601 Acute respiratory failure with hypoxia: Secondary | ICD-10-CM | POA: Diagnosis not present

## 2020-10-16 DIAGNOSIS — E440 Moderate protein-calorie malnutrition: Secondary | ICD-10-CM

## 2020-10-16 DIAGNOSIS — R6521 Severe sepsis with septic shock: Secondary | ICD-10-CM | POA: Diagnosis not present

## 2020-10-16 DIAGNOSIS — A419 Sepsis, unspecified organism: Secondary | ICD-10-CM | POA: Diagnosis not present

## 2020-10-16 LAB — POCT I-STAT 7, (LYTES, BLD GAS, ICA,H+H)
Acid-base deficit: 3 mmol/L — ABNORMAL HIGH (ref 0.0–2.0)
Bicarbonate: 22.7 mmol/L (ref 20.0–28.0)
Calcium, Ion: 1.04 mmol/L — ABNORMAL LOW (ref 1.15–1.40)
HCT: 22 % — ABNORMAL LOW (ref 39.0–52.0)
Hemoglobin: 7.5 g/dL — ABNORMAL LOW (ref 13.0–17.0)
O2 Saturation: 99 %
Patient temperature: 36.5
Potassium: 3.5 mmol/L (ref 3.5–5.1)
Sodium: 142 mmol/L (ref 135–145)
TCO2: 24 mmol/L (ref 22–32)
pCO2 arterial: 40.3 mmHg (ref 32.0–48.0)
pH, Arterial: 7.357 (ref 7.350–7.450)
pO2, Arterial: 122 mmHg — ABNORMAL HIGH (ref 83.0–108.0)

## 2020-10-16 LAB — CBC
HCT: 25.9 % — ABNORMAL LOW (ref 39.0–52.0)
Hemoglobin: 8.3 g/dL — ABNORMAL LOW (ref 13.0–17.0)
MCH: 30.5 pg (ref 26.0–34.0)
MCHC: 32 g/dL (ref 30.0–36.0)
MCV: 95.2 fL (ref 80.0–100.0)
Platelets: 211 10*3/uL (ref 150–400)
RBC: 2.72 MIL/uL — ABNORMAL LOW (ref 4.22–5.81)
RDW: 12.6 % (ref 11.5–15.5)
WBC: 10.6 10*3/uL — ABNORMAL HIGH (ref 4.0–10.5)
nRBC: 0 % (ref 0.0–0.2)

## 2020-10-16 LAB — BASIC METABOLIC PANEL
Anion gap: 9 (ref 5–15)
BUN: 54 mg/dL — ABNORMAL HIGH (ref 8–23)
CO2: 25 mmol/L (ref 22–32)
Calcium: 7.1 mg/dL — ABNORMAL LOW (ref 8.9–10.3)
Chloride: 107 mmol/L (ref 98–111)
Creatinine, Ser: 1.51 mg/dL — ABNORMAL HIGH (ref 0.61–1.24)
GFR, Estimated: 46 mL/min — ABNORMAL LOW (ref >60)
Glucose, Bld: 329 mg/dL — ABNORMAL HIGH (ref 70–99)
Potassium: 4.1 mmol/L (ref 3.5–5.1)
Sodium: 141 mmol/L (ref 135–145)

## 2020-10-16 LAB — COOXEMETRY PANEL
Carboxyhemoglobin: 0.7 % (ref 0.5–1.5)
Carboxyhemoglobin: 1.2 % (ref 0.5–1.5)
Methemoglobin: 0.9 % (ref 0.0–1.5)
Methemoglobin: 0.8 % (ref 0.0–1.5)
O2 Saturation: 30.3 %
O2 Saturation: 76.4 %
Total hemoglobin: 8.3 g/dL — ABNORMAL LOW (ref 12.0–16.0)
Total hemoglobin: 8 g/dL — ABNORMAL LOW (ref 12.0–16.0)

## 2020-10-16 LAB — CULTURE, RESPIRATORY W GRAM STAIN: Culture: NORMAL

## 2020-10-16 LAB — GLUCOSE, CAPILLARY
Glucose-Capillary: 294 mg/dL — ABNORMAL HIGH (ref 70–99)
Glucose-Capillary: 298 mg/dL — ABNORMAL HIGH (ref 70–99)
Glucose-Capillary: 305 mg/dL — ABNORMAL HIGH (ref 70–99)
Glucose-Capillary: 305 mg/dL — ABNORMAL HIGH (ref 70–99)
Glucose-Capillary: 315 mg/dL — ABNORMAL HIGH (ref 70–99)
Glucose-Capillary: 321 mg/dL — ABNORMAL HIGH (ref 70–99)

## 2020-10-16 LAB — LACTIC ACID, PLASMA: Lactic Acid, Venous: 3.4 mmol/L (ref 0.5–1.9)

## 2020-10-16 LAB — PROCALCITONIN: Procalcitonin: 43.14 ng/mL

## 2020-10-16 LAB — LEGIONELLA PNEUMOPHILA SEROGP 1 UR AG: L. pneumophila Serogp 1 Ur Ag: NEGATIVE

## 2020-10-16 LAB — PHOSPHORUS: Phosphorus: 2.6 mg/dL (ref 2.5–4.6)

## 2020-10-16 MED ORDER — HEPARIN SODIUM (PORCINE) 5000 UNIT/ML IJ SOLN
5000.0000 [IU] | Freq: Three times a day (TID) | INTRAMUSCULAR | Status: DC
  Administered 2020-10-16 – 2020-10-27 (×34): 5000 [IU] via SUBCUTANEOUS
  Filled 2020-10-16 (×34): qty 1

## 2020-10-16 MED ORDER — NOREPINEPHRINE 4 MG/250ML-% IV SOLN
0.0000 ug/min | INTRAVENOUS | Status: DC
  Administered 2020-10-19: 15 ug/min via INTRAVENOUS
  Administered 2020-10-19: 17 ug/min via INTRAVENOUS
  Administered 2020-10-19: 2 ug/min via INTRAVENOUS
  Filled 2020-10-16 (×4): qty 250

## 2020-10-16 MED ORDER — LACTULOSE 10 GM/15ML PO SOLN
30.0000 g | Freq: Two times a day (BID) | ORAL | Status: DC
  Administered 2020-10-16 – 2020-10-17 (×4): 30 g
  Filled 2020-10-16 (×4): qty 45

## 2020-10-16 MED ORDER — IPRATROPIUM-ALBUTEROL 0.5-2.5 (3) MG/3ML IN SOLN
3.0000 mL | Freq: Four times a day (QID) | RESPIRATORY_TRACT | Status: DC
  Administered 2020-10-16 – 2020-10-27 (×44): 3 mL via RESPIRATORY_TRACT
  Filled 2020-10-16 (×44): qty 3

## 2020-10-16 MED ORDER — INSULIN ASPART 100 UNIT/ML ~~LOC~~ SOLN
3.0000 [IU] | SUBCUTANEOUS | Status: DC
  Administered 2020-10-16 – 2020-10-23 (×34): 3 [IU] via SUBCUTANEOUS

## 2020-10-16 MED ORDER — IPRATROPIUM-ALBUTEROL 0.5-2.5 (3) MG/3ML IN SOLN
RESPIRATORY_TRACT | Status: AC
  Administered 2020-10-16: 3 mL via RESPIRATORY_TRACT
  Filled 2020-10-16: qty 3

## 2020-10-16 MED ORDER — EZETIMIBE 10 MG PO TABS
10.0000 mg | ORAL_TABLET | Freq: Every day | ORAL | Status: DC
  Administered 2020-10-16 – 2020-10-30 (×15): 10 mg
  Filled 2020-10-16 (×15): qty 1

## 2020-10-16 MED ORDER — LACTULOSE 10 GM/15ML PO SOLN: 30.0000 g | Freq: Two times a day (BID) | ORAL | Status: DC

## 2020-10-16 MED ORDER — SODIUM CHLORIDE 0.9% FLUSH
10.0000 mL | INTRAVENOUS | Status: DC | PRN
  Administered 2020-10-18: 10 mL

## 2020-10-16 MED ORDER — PHENYLEPHRINE 40 MCG/ML (10ML) SYRINGE FOR IV PUSH (FOR BLOOD PRESSURE SUPPORT): 80.0000 ug | PREFILLED_SYRINGE | Freq: Once | INTRAVENOUS | Status: AC | PRN

## 2020-10-16 MED ORDER — FUROSEMIDE 10 MG/ML IJ SOLN
40.0000 mg | Freq: Once | INTRAMUSCULAR | Status: AC
  Administered 2020-10-16: 40 mg via INTRAVENOUS
  Filled 2020-10-16: qty 4

## 2020-10-16 MED ORDER — NOREPINEPHRINE 4 MG/250ML-% IV SOLN
INTRAVENOUS | Status: AC
  Administered 2020-10-16: 5 ug/min via INTRAVENOUS
  Filled 2020-10-16: qty 250

## 2020-10-16 MED ORDER — SODIUM CHLORIDE 0.9% FLUSH
10.0000 mL | Freq: Two times a day (BID) | INTRAVENOUS | Status: DC
  Administered 2020-10-16 – 2020-10-30 (×19): 10 mL

## 2020-10-16 MED ORDER — INSULIN DETEMIR 100 UNIT/ML ~~LOC~~ SOLN
15.0000 [IU] | Freq: Two times a day (BID) | SUBCUTANEOUS | Status: DC
  Administered 2020-10-16 – 2020-10-24 (×18): 15 [IU] via SUBCUTANEOUS
  Filled 2020-10-16 (×20): qty 0.15

## 2020-10-16 MED ORDER — SODIUM CHLORIDE 0.9 % IV SOLN
100.0000 mg | Freq: Two times a day (BID) | INTRAVENOUS | Status: DC
  Administered 2020-10-16 – 2020-10-18 (×6): 100 mg via INTRAVENOUS
  Filled 2020-10-16 (×8): qty 100

## 2020-10-16 MED ORDER — PHENYLEPHRINE 40 MCG/ML (10ML) SYRINGE FOR IV PUSH (FOR BLOOD PRESSURE SUPPORT)
PREFILLED_SYRINGE | INTRAVENOUS | Status: AC
  Administered 2020-10-16: 80 ug via INTRAVENOUS
  Filled 2020-10-16: qty 10

## 2020-10-16 MED ORDER — NEOSTIGMINE METHYLSULFATE 10 MG/10ML IV SOLN: 0.2500 mg | Freq: Four times a day (QID) | INTRAVENOUS | Status: DC

## 2020-10-16 MED ORDER — NEOSTIGMINE METHYLSULFATE 10 MG/10ML IV SOLN
0.2500 mg | Freq: Four times a day (QID) | INTRAVENOUS | Status: AC
  Administered 2020-10-16 – 2020-10-18 (×8): 0.25 mg via SUBCUTANEOUS
  Filled 2020-10-16 (×10): qty 0.25

## 2020-10-16 MED ORDER — FENTANYL CITRATE (PF) 100 MCG/2ML IJ SOLN
100.0000 ug | Freq: Once | INTRAMUSCULAR | Status: AC
  Administered 2020-10-16: 100 ug via INTRAVENOUS

## 2020-10-16 NOTE — Progress Notes (Signed)
Patient remains critically ill.  He remains sedated on the ventilator.  He had one period of significant hypoxia overnight secondary to mucous plugging.  This was relieved with bronchoscopy and subsequent suctioning.  Repeat cultures were taken.  His pulmonary status has stabilized and once again his FiO2 requirements are decreasing.  He is afebrile.  Heart rate is stable.  Blood pressure is better but still trending on the low side.  Urine output is good.  The patient did awaken during wake-up assessment today but otherwise has been kept sedated.  White blood cell count is minimally elevated.  His hematocrit is stable.  Renal function is stable.  Chest x-ray with continued bilateral infiltrates unchanged.  Initial bronchial cultures negative for obvious infectious pathogen.  Repeat bronchial culture taken today with more inflammatory changes but no organisms seen.  Previous blood cultures negative.  Patient status post acute respiratory failure of unclear etiology.  Possible pneumonia/sepsis versus upper airway obstruction with negative pressure pulmonary edema.  Patient with significant perievent stress and significant myocardial injury which appears to be improving.  Continue efforts at ventilatory weaning and broad-spectrum antibiotics.

## 2020-10-16 NOTE — Progress Notes (Addendum)
Advanced Heart Failure Rounding Note   Subjective:    Remains intubated and sedated. Had a mucous plug overnight and dropped sats and BP. Started on NE and then weaned off. Resolved with deep suctioning.  Co-ox 30% overnight now 76%. Creatinine stable at 1.5 CXR still with persistent bilateral infiltrates R>L   [place] numbers (done personally):  CVP 11 PA 46/20 (30) PCWP 19   No cardiac output set up  Co-ox 76%  Objective:   Weight Range:  Vital Signs:   Temp:  [97.2 F (36.2 C)-100.7 F (38.2 C)] 97.7 F (36.5 C) (12/07 0400) Pulse Rate:  [0-87] 86 (12/07 0400) Resp:  [0-54] 25 (12/07 0400) BP: (87-135)/(45-71) 129/71 (12/07 0400) SpO2:  [0 %-100 %] 95 % (12/07 0400) Arterial Line BP: (91-211)/(40-198) 156/62 (12/07 0400) FiO2 (%):  [40 %-60 %] 60 % (12/07 0357) Weight:  [70.4 kg] 70.4 kg (12/07 0432) Last BM Date: 10/12/20  Weight change: Filed Weights   10/12/20 1413 10/30/2020 0500 10/16/20 0432  Weight: 70.3 kg 69.5 kg 70.4 kg    Intake/Output:   Intake/Output Summary (Last 24 hours) at 10/16/2020 0621 Last data filed at 10/16/2020 0400 Gross per 24 hour  Intake 1772.15 ml  Output 905 ml  Net 867.15 ml     Physical Exam: General:  Ill appearing. Intubated sedated HEENT: normal +ETT Neck: supple. RIJ swan . Carotids 2+ bilat; no bruits. No lymphadenopathy or thryomegaly appreciated. Cor: PMI nondisplaced. Regular rate & rhythm. No rubs, gallops or murmurs. Lungs: coarse Abdomen: soft, nontender, nondistended. No hepatosplenomegaly. No bruits or masses. Good bowel sounds. Extremities: no cyanosis, clubbing, rash, edema Neuro: alert & orientedx3, cranial nerves grossly intact. moves all 4 extremities w/o difficulty. Affect pleasant  Telemetry: sinus 80s Personally reviewed  Labs: Basic Metabolic Panel: Recent Labs  Lab 10/13/20 1513 10/13/20 2233 10/14/20 0345 10/14/20 0345 10/14/20 1550 10/14/20 1550 10/14/2020 0547 10/17/2020 0547  10/21/2020 1114 10/27/2020 1116 10/21/2020 1122 10/16/20 0143 10/16/20 0158  NA 141   < > 142   < > 141   < > 140   < > 142 143 142 142 141  K 4.6   < > 4.2   < > 3.4*   < > 3.5   < > 3.6 3.5 3.5 3.5 4.1  CL 111  --  105  --  102  --  104  --   --   --   --   --  107  CO2 18*  --  22  --  26  --  25  --   --   --   --   --  25  GLUCOSE 259*  --  221*  --  213*  --  252*  --   --   --   --   --  329*  BUN 41*  --  46*  --  42*  --  47*  --   --   --   --   --  54*  CREATININE 1.48*  --  1.81*  --  1.72*  --  1.53*  --   --   --   --   --  1.51*  CALCIUM 7.5*  --  7.0*   < > 6.7*  --  7.4*  --   --   --   --   --  7.1*  MG  --   --   --   --   --   --  1.9  --   --   --   --   --   --   PHOS  --   --   --   --   --   --  2.7  --   --   --   --   --  2.6   < > = values in this interval not displayed.    Liver Function Tests: Recent Labs  Lab 11/09/2020 1759 10/13/20 1513 10/14/20 0345 10/14/20 1550 10/21/2020 0547  AST 29 56* 43* 42* 43*  ALT 24 43 38 36 40  ALKPHOS 74 81 62 60 58  BILITOT 0.6 0.7 1.3* 1.4* 1.1  PROT 6.2* 5.9* 5.0* 5.0* 5.0*  ALBUMIN 3.3* 2.9* 2.4* 2.2* 1.9*   No results for input(s): LIPASE, AMYLASE in the last 168 hours. No results for input(s): AMMONIA in the last 168 hours.  CBC: Recent Labs  Lab 10/29/2020 1759 10/13/20 1513 10/13/20 2303 10/14/20 0306 10/14/20 0345 10/14/20 0345 10/14/20 1550 10/14/20 1550 10/27/2020 0547 11/05/2020 1114 10/30/2020 1116 10/18/2020 1122 11/02/2020 1400 10/16/20 0143 10/16/20 0158  WBC 6.1   < > 7.6  --  10.3  --  12.6*  --  9.8  --   --   --   --   --  10.6*  NEUTROABS 3.5  --   --   --  8.0*  --   --   --   --   --   --   --   --   --   --   HGB 9.8*   < > 11.7*   < > 10.6*   < > 9.9*   < > 8.5*   < > 7.8* 7.5* 8.1* 7.5* 8.3*  HCT 29.0*   < > 36.6*   < > 31.9*   < > 29.1*   < > 26.2*   < > 23.0* 22.0* 25.1* 22.0* 25.9*  MCV 91.2   < > 93.8  --  91.4  --  89.8  --  93.2  --   --   --   --   --  95.2  PLT 255   < > 354  --  319   --  301  --  255  --   --   --   --   --  211   < > = values in this interval not displayed.    Cardiac Enzymes: No results for input(s): CKTOTAL, CKMB, CKMBINDEX, TROPONINI in the last 168 hours.  BNP: BNP (last 3 results) Recent Labs    10/13/20 1513  BNP 589.0*    ProBNP (last 3 results) No results for input(s): PROBNP in the last 8760 hours.    Other results:  Imaging: CARDIAC CATHETERIZATION  Result Date: 10/28/2020 Right heart catheterization 10/23/2020: RA 8/6, mean 6 mmHg, RA saturation 48%. RV 24/6, EDP 8 mmHg. PA 22/11, mean 17 mmHg.  PA saturation 53%. PW 14/13, mean 9 mmHg.  Aortic saturation was 99%. Cardiac output by Fick was 4.09 and by thermodilution was 3.51 with cardiac index of 2.26 and 1.94 respectively. SVR 1467 dynes per second, PVR 156 dynes per second.  QP/QS 1.00. Impression: Normal right heart pressure.  Decreased cardiac index, consistent with cardiomyopathy.  However patient does not appear to be in pulmonary edema from cardiogenic cause. I will repeat COVID -19 test as well just as a precaution. Will discuss with PCCM.  DG Chest Port 1 View  Result Date: 10/16/2020 CLINICAL  DATA:  Mucous plugging EXAM: PORTABLE CHEST 1 VIEW COMPARISON:  October 15, 2020 FINDINGS: The heart size and mediastinal contours are within normal limits. Again noted are multifocal bilateral patchy airspace opacities not significantly changed since the prior exam. There is interval placement of a right PA catheter with the tip just entering the right main pulmonary artery. ETT is seen 3 cm above the carina. NG tube is seen below the diaphragm. IMPRESSION: Lines and tubes in satisfactory position. Unchanged multifocal airspace opacities Electronically Signed   By: Prudencio Pair M.D.   On: 10/16/2020 02:06   DG CHEST PORT 1 VIEW  Result Date: 11/01/2020 CLINICAL DATA:  Respiratory failure. EXAM: PORTABLE CHEST 1 VIEW COMPARISON:  Chest x-ray 10/14/2020 FINDINGS: The endotracheal tube  and NG tubes in good position, unchanged. Persistent bilateral lung infiltrates. No pleural effusion or pneumothorax. IMPRESSION: 1. Stable support apparatus. 2. Persistent bilateral lung infiltrates. Electronically Signed   By: Marijo Sanes M.D.   On: 11/06/2020 07:21   DG Chest Port 1 View  Result Date: 10/14/2020 CLINICAL DATA:  Shortness of breath EXAM: PORTABLE CHEST 1 VIEW COMPARISON:  10/13/2020 FINDINGS: Cardiac shadow is stable. Gastric catheter and endotracheal tube are again seen and stable. The lungs again demonstrate diffuse airspace opacity with some increasing consolidation on the right. No sizable effusion is seen. No bony abnormality is noted. IMPRESSION: Patchy airspace opacities bilaterally right greater than left with some increasing consolidation particularly in the right upper lobe. Electronically Signed   By: Inez Catalina M.D.   On: 10/14/2020 10:57      Medications:     Scheduled Medications: . aspirin  81 mg Per Tube Daily  . bisacodyl  10 mg Rectal Daily  . chlorhexidine gluconate (MEDLINE KIT)  15 mL Mouth Rinse BID  . Chlorhexidine Gluconate Cloth  6 each Topical Daily  . diazepam  2.5 mg Per Tube Q6H  . feeding supplement (PROSource TF)  90 mL Per Tube BID  . HYDROcodone-acetaminophen  1 tablet Per Tube Q4H  . insulin aspart  0-15 Units Subcutaneous Q4H  . insulin glargine  10 Units Subcutaneous QHS  . ipratropium-albuterol  3 mL Nebulization Q6H  . mouth rinse  15 mL Mouth Rinse 10 times per day  . pantoprazole sodium  40 mg Per Tube Q1200  . polyethylene glycol  17 g Per Tube Daily  . pregabalin  75 mg Per Tube BID  . rosuvastatin  20 mg Per Tube Daily  . sodium chloride flush  3 mL Intravenous Q12H     Infusions: . sodium chloride    . sodium chloride    . ceFEPime (MAXIPIME) IV Stopped (10/16/2020 2207)  . dexmedetomidine (PRECEDEX) IV infusion 0.6 mcg/kg/hr (10/16/20 0400)  . doxycycline (VIBRAMYCIN) IV Stopped (10/16/20 0158)  . feeding  supplement (VITAL 1.5 CAL) 1,000 mL (10/18/2020 1707)  . fentaNYL infusion INTRAVENOUS 200 mcg/hr (10/16/20 0400)  . norepinephrine (LEVOPHED) Adult infusion Stopped (10/16/20 0146)  . vasopressin 0.03 Units/min (10/16/20 0400)     PRN Medications:  Place/Maintain arterial line **AND** sodium chloride, sodium chloride, acetaminophen (TYLENOL) oral liquid 160 mg/5 mL, midazolam, ondansetron **OR** ondansetron (ZOFRAN) IV, sodium chloride flush   Assessment/Plan:   1. Acute Hypoxemic Respiratory Failure - due to PNA and HF - Developed acute respiratory failure on 12/4 and required intubation.  - CXR with pulmonary edema + infiltrates on 12/4.  - PCT 109 -> 84  - Developed mucous plugging overnight with severe respi distress and hypotension. Resolved with  deep suctioning - CXR with persistent bilateral infiltrates R>L  - CCM following. ? Need for bronch - Continue abx. Cultures pending  2. Shock - likely sepsis/cardiogenic combined. Suspect sepsis/PBA predominates at this time - Lactic Acid 3.6>4.6 >2.8  - Echo- reduced EF with EF 25-30% and WMA. RV normal.  - On vasopressin 0.03 units. On NE overnight. Now off. Co-ox 76% - On antimicrobial coverage for possible HCAP  - Blood CX- NGTD  Sputum Cx- no growth.   3. Acute Systolic Heart Failure  - As above Echo with EF 25-30% . No previous ECHO. No previous cath.  - Hs Trop [phone]. - RHC 12/6 with low filling pressures after diuresis and low index 1.9. SVR 1467 - Unable to add GDMT due to shock.  - Suspect combination of septic and cardiogenic shock. Troponin moderately elevated but flat. Possibility of Tako-tsubo/septic CM has been raised and seems to fit clinically however ECG also concerning for anterior MI. Will need eventual coronary angiography once recovered - Follow swan numbers. Will give one dose IV lasix today. Set-up cardiac outputs  4. Back Pain--> S/P L5-S1 laminectomy with fusion 09/28/2020  Per  Neurosugery  5. AKI Creatinine trending down  1.3>1.4>1.8>1.7>1.5 > 1.5   CRITICAL CARE Performed by: Glori Bickers  Total critical care time: 35 minutes  Critical care time was exclusive of separately billable procedures and treating other patients.  Critical care was necessary to treat or prevent imminent or life-threatening deterioration.  Critical care was time spent personally by me (independent of midlevel providers or residents) on the following activities: development of treatment plan with patient and/or surrogate as well as nursing, discussions with consultants, evaluation of patient's response to treatment, examination of patient, obtaining history from patient or surrogate, ordering and performing treatments and interventions, ordering and review of laboratory studies, ordering and review of radiographic studies, pulse oximetry and re-evaluation of patient's condition.   Length of Stay: 6   Glori Bickers MD 10/16/2020, 6:21 AM  Advanced Heart Failure Team Pager [phone] (M-F; [place])  Please contact [facility] for night-coverage after hours (4p -7a ) and weekends on [email or web address]

## 2020-10-16 NOTE — Progress Notes (Signed)
Patient continues to have increasing secretions.  Tube was partially occluded when I tried to pass it for suctioning.  After lavaging patient I was able to pass the cather and retrieved a large amount of tan secretions containing mucus plugs. RT will continue to assess patient throughout the night.

## 2020-10-16 NOTE — Progress Notes (Signed)
[facility] Progress Note Patient Name: Dennis Carey DOB: [birth year] MRN: [id number]   Date of Service  10/16/2020  HPI/Events of Note  Notified  of hypotension after an episode of desaturation wherein a mucus plug was removed. CVP 13. On vasopressin 0.03. Precedex now off, was at 0.8. Fentanyl down to 50 from 225. On AC 20/520/100%/5 PEEP, Peak pressure 14  eICU Interventions  Stat CXR done no pneumothorax noted Given neosynephrine 80 push Resume norepinephrine AM labs to be drawn early     Intervention Category Major Interventions: Hypotension - evaluation and management  Judd Lien 10/16/2020, 1:59 AM

## 2020-10-16 NOTE — Progress Notes (Signed)
Inpatient Diabetes Program Recommendations  AACE/ADA: New Consensus Statement on Inpatient Glycemic Control (2015)  Target Ranges:  Prepandial:   less than 140 mg/dL      Peak postprandial:   less than 180 mg/dL (1-2 hours)      Critically ill patients:  140 - 180 mg/dL   Lab Results  Component Value Date   GLUCAP 298 (H) 10/16/2020   HGBA1C 7.7 (H) 10/12/2020    Review of Glycemic Control Results for Dennis Carey, Dennis Carey (MRN [id number]) as of 10/16/2020 12:39  Ref. Range 10/23/2020 15:45 10/14/2020 20:02 10/16/2020 00:01 10/16/2020 04:00 10/16/2020 08:05  Glucose-Capillary Latest Ref Range: 70 - 99 mg/dL 171 (H) 245 (H) 321 (H) 305 (H) 298 (H)   Inpatient Diabetes Program Recommendations:   Noted Lantus increase to 15 units bid. Consider Novolog 3 units q 3 hrs tube feed coverage (hold if tube feed held or stopped for any reason)   Thank you, Bethena Roys E. Kadarrius Yanke, RN, MSN, CDE  Diabetes Coordinator Inpatient Glycemic Control Team Team Pager [phone] (8am-5pm) 10/16/2020 12:45 PM

## 2020-10-16 NOTE — Progress Notes (Signed)
Pharmacy Antibiotic Note  Dennis Carey is a 85 y.o. male admitted on 10/14/2020 with back s/p L5-S1 decompression and fusion 10 days prior. Pharmacy has been consulted for cefepime dosing for pneumonia.   The patient's SCr currently stable at 1.5, CrCl~30-35 ml/min. Will continue to monitor trends in renal function closely. Tmax 100.7 overnight, wbc stable at 10.   Cardiology had a discussion with critical care on 12/6 and added doxy with concern for atypical pneumonia. No dose adjustments needed, will follow up length of therapy.   Plan: - Continue Cefepime 2g IV every 12 hours  - Doxy 100 mg q12 hours for now - Will continue to follow renal function, culture results, LOT, and antibiotic de-escalation plans   Height: 5\' 7"  (170.2 cm) Weight: 70.4 kg (155 lb 3.3 oz) IBW/kg (Calculated) : 66.1  Temp (24hrs), Avg:98.5 F (36.9 C), Min:97.2 F (36.2 C), Max:100.7 F (38.2 C)  Recent Labs  Lab 10/13/20 1513 10/13/20 1513 10/13/20 1801 10/13/20 2303 10/14/20 0345 10/14/20 1050 10/14/20 1550 10/16/2020 0547 10/16/20 0158  WBC 16.7*   < >  --  7.6 10.3  --  12.6* 9.8 10.6*  CREATININE 1.48*  --   --   --  1.81*  --  1.72* 1.53* 1.51*  LATICACIDVEN 4.1*  --  3.6*  --   --  4.6* 2.8* 2.6*  --    < > = values in this interval not displayed.    Estimated Creatinine Clearance: 34.7 mL/min (A) (by C-G formula based on SCr of 1.51 mg/dL (H)).    Allergies  Allergen Reactions  . Sulfa Antibiotics Hives    Antimicrobials this admission: Vancomycin 12/4 x1 Cefepime 12/4 >>  Doxy 12/6>>  Microbiology results: 12/4 MRSA PCR - negative 12/4 BCx - ngtd 12/4 TA -  mod GPC+ GNR, abund WBC (reicnubated)>nf 12/5 TA - reincubate 12/7 TA    Thank you for allowing pharmacy to be a part of this patient's care.  Erin Hearing PharmD., BCPS Clinical Pharmacist 10/16/2020 7:33 AM    **Pharmacist phone directory can now be found on [email or web address] ([email or web address]).  Listed under [place].

## 2020-10-16 NOTE — Procedures (Signed)
Bronchoscopy Procedure Note  RAYEN DAFOE  [id number]  [birth year]  Date:10/16/20  Time:8:35 AM   Provider Performing:Makaylah Oddo C Tamala Julian   Procedure(s):  Flexible bronchoscopy with bronchial alveolar lavage [phone]) and Initial Therapeutic Aspiration of Tracheobronchial Tree [phone])  Indication(s) Mucus plugging Persistent RUL PNA  Consent Obtained verbally from son at bedside who remained at bedside for procedure.  Anesthesia In place for mechanical ventilation   Time Out Verified patient identification, verified procedure, site/side was marked, verified correct patient position, special equipment/implants available, medications/allergies/relevant history reviewed, required imaging and test results available.   Sterile Technique Usual hand hygiene, masks, gowns, and gloves were used   Procedure Description Bronchoscope advanced through endotracheal tube and into airway.  Airways were examined down to subsegmental level with findings noted below.   Following diagnostic evaluation, BAL(s) performed in RUL with normal saline and return of mucus-plug filled fluid fluid and Therapeutic aspiration performed in bilateral mainstem bronchi  Findings:  - Copious thick secretions occluding bilateral mainstem bronchi - Left lung clear underneath - Right lung with mucus plugging down to segmental level suctioned as much as possible - Underlying mucosa with mild inflammation   Complications/Tolerance None; patient tolerated the procedure well. Chest X-ray is not needed post procedure.   EBL Minimal   Specimen(s) RUL BAL

## 2020-10-16 NOTE — Progress Notes (Signed)
Subjective:  Intubated and sedated.  Mucous plugging last night, needed bolus of vasopressin, presently off of all pressors.  Intake/Output from previous day:  I/O last 3 completed shifts: In: 2688.2 [I.V.:1473.4; NG/GT:364.8; IV Piggyback:850.1] Out: 4008 [QPYPP:5093] Total I/O In: 152.2 [I.V.:152.2] Out: 375 [Urine:375]  Blood pressure (!) 118/57, pulse 100, temperature 99 F (37.2 C), resp. rate 17, height '5\' 7"'  (1.702 m), weight 70.4 kg, SpO2 98 %.   Vitals with BMI 10/16/2020 10/16/2020 10/16/2020  Height - - -  Weight - - -  BMI - - -  Systolic - - 267  Diastolic - - 57  Pulse 124 95 99    Physical Exam Constitutional:      Interventions: He is sedated and intubated.  Cardiovascular:     Rate and Rhythm: Regular rhythm. Bradycardia present.     Pulses: Intact distal pulses.     Heart sounds: Normal heart sounds. No murmur heard.  No gallop.      Comments: No leg edema, no JVD. Pulmonary:     Effort: Pulmonary effort is normal. He is intubated.     Breath sounds: Rhonchi (bilateral diffuse) present.  Abdominal:     General: There is distension.     Palpations: Abdomen is soft.     Comments: Bowel sounds sluggish  Musculoskeletal:        General: No swelling or deformity.    Lab Results: BMP BNP (last 3 results) Recent Labs    10/13/20 1513  BNP 589.0*    ProBNP (last 3 results) No results for input(s): PROBNP in the last 8760 hours. BMP Latest Ref Rng & Units 10/16/2020 10/16/2020 10/12/2020  Glucose 70 - 99 mg/dL 329(H) - -  BUN 8 - 23 mg/dL 54(H) - -  Creatinine 0.61 - 1.24 mg/dL 1.51(H) - -  Sodium 135 - 145 mmol/L 141 142 142  Potassium 3.5 - 5.1 mmol/L 4.1 3.5 3.5  Chloride 98 - 111 mmol/L 107 - -  CO2 22 - 32 mmol/L 25 - -  Calcium 8.9 - 10.3 mg/dL 7.1(L) - -   Hepatic Function Latest Ref Rng & Units 11/09/2020 10/14/2020 10/14/2020  Total Protein 6.5 - 8.1 g/dL 5.0(L) 5.0(L) 5.0(L)  Albumin 3.5 - 5.0 g/dL 1.9(L) 2.2(L) 2.4(L)  AST 15 - 41 U/L  43(H) 42(H) 43(H)  ALT 0 - 44 U/L 40 36 38  Alk Phosphatase 38 - 126 U/L 58 60 62  Total Bilirubin 0.3 - 1.2 mg/dL 1.1 1.4(H) 1.3(H)   CBC Latest Ref Rng & Units 10/16/2020 10/16/2020 10/13/2020  WBC 4.0 - 10.5 K/uL 10.6(H) - -  Hemoglobin 13.0 - 17.0 g/dL 8.3(L) 7.5(L) 8.1(L)  Hematocrit 39 - 52 % 25.9(L) 22.0(L) 25.1(L)  Platelets 150 - 400 K/uL 211 - -   Lipid Panel  No results found for: CHOL, TRIG, HDL, CHOLHDL, VLDL, LDLCALC, LDLDIRECT Cardiac Panel (last 3 results) No results for input(s): CKTOTAL, CKMB, TROPONINI, RELINDX in the last 72 hours.  HEMOGLOBIN A1C Lab Results  Component Value Date   HGBA1C 7.7 (H) 10/12/2020   MPG 174.29 10/12/2020   TSH No results for input(s): TSH in the last 8760 hours. Imaging: Imaging results have been reviewed  DG Chest 1 View  10/13/2020 CLINICAL DATA:  Hypoxemia EXAM: CHEST  1 VIEW COMPARISON:  Radiograph 10/13/2020 FINDINGS: Endotracheal tube terminates in the mid trachea, 5.5 cm from the carina. Transesophageal tube tip and side port are distal to the GE junction, terminating below the margins of imaging. Telemetry leads and  external support devices overlie the chest. Redemonstration of the heterogeneous opacity in both lungs,, right greater than left with some slight interval clearing in the right mid lung, possibly related to improving lung volumes when compared to prior study. No visible pneumothorax or effusion. The aorta is calcified. The remaining cardiomediastinal contours are unremarkable. No acute osseous or soft tissue abnormality.  IMPRESSION: 1. Persistent bilateral, opacities, right greater than left. Slight interval clearing in the right mid lung, possibly related to improving lung volumes when compared to prior study. 2. Support devices as above. Electronically Signed   By: Lovena Le M.D.   On: 10/13/2020 23:00    DG Chest 1 View  10/16/2020  1. Stable support apparatus. 2. Persistent bilateral lung infiltrates.   DG  Chest 1 View  10/16/2020   Lines and tubes in satisfactory position.   Unchanged multifocal airspace opacities  Lower Extremity Venous Duplex  10/13/2020: Summary: RIGHT: - There is no evidence of deep vein thrombosis in the lower extremity.  - No cystic structure found in the popliteal fossa.  LEFT: - There is no evidence of deep vein thrombosis in the lower extremity.  - No cystic structure found in the popliteal fossa.  *See table(s) above for measurements and observations. Electronically signed by Harold Barban MD on 10/13/2020 at 5:35:09 PM. Cardiac Studies:   Echocardiogram 10/13/2020:  1. Left ventricular ejection fraction, by estimation, is 25 to 30%. The left ventricle has severely decreased function. The left ventricle demonstrates regional wall motion abnormalities (see scoring diagram/findings for description). Left ventricular  diastolic parameters are indeterminate. 2. Right ventricular systolic function is normal. The right ventricular size is normal. There is normal pulmonary artery systolic pressure. 3. The mitral valve is grossly normal. Trivial mitral valve regurgitation. No evidence of mitral stenosis. 4. The aortic valve is grossly normal. There is mild calcification of the aortic valve. Aortic valve regurgitation is not visualized. No aortic stenosis is present. 5. The inferior vena cava is normal in size with <50% respiratory variability, suggesting right atrial pressure of 8 mmHg.   EKG:  EKG 10/14/2020: Normal sinus rhythm at rate of 69 bpm, normal axis, poor R wave progression, cannot exclude anteroseptal infarct old.  T wave abnormality, anterolateral ischemia.  Compared to April 19, 2020, poor R wave progression and T wave abnormality new.  Compared to yesterday, tibial abnormality In the anterolateral leads is new.  Assessment    1.    Combination of cardiogenic and septic shock.  He is now off of pressors. 2.  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   Echocardiogram personally reviewed, mid to distal anterior, anteroapical, mid to distal inferior and inferoapical akinesis.  Troponin leak out of proportion to severe LV systolic dysfunction. 3.  Lung infiltrates, chest x-ray repeated, right lung infiltrate persist, component of aspiration, ARDS/ pneumonia- being managed by pulmonary critical care.  Complaint of pulmonary edema. 4.  Abdominal distention and decreased bowel sounds, suspect mild ileus due to multisystem organ failure.   Scheduled Meds: . aspirin  81 mg Per Tube Daily  . bisacodyl  10 mg Rectal Daily  . chlorhexidine gluconate (MEDLINE KIT)  15 mL Mouth Rinse BID  . Chlorhexidine Gluconate Cloth  6 each Topical Daily  . diazepam  2.5 mg Per Tube Q6H  . ezetimibe  10 mg Per Tube Daily  . feeding supplement (PROSource TF)  90 mL Per Tube BID  . furosemide  40 mg Intravenous Once  . heparin injection (subcutaneous)  5,000  Units Subcutaneous Q8H  . insulin aspart  0-15 Units Subcutaneous Q4H  . insulin detemir  15 Units Subcutaneous BID  . ipratropium-albuterol  3 mL Nebulization Q6H  . lactulose  30 g Per Tube BID  . mouth rinse  15 mL Mouth Rinse 10 times per day  . neostigmine  0.25 mg Intramuscular Q6H  . pantoprazole sodium  40 mg Per Tube Q1200  . polyethylene glycol  17 g Per Tube Daily  . pregabalin  75 mg Per Tube BID  . rosuvastatin  20 mg Per Tube Daily  . sodium chloride flush  3 mL Intravenous Q12H   Continuous Infusions: . sodium chloride    . sodium chloride    . ceFEPime (MAXIPIME) IV Stopped (10/19/2020 2207)  . dexmedetomidine (PRECEDEX) IV infusion 0.6 mcg/kg/hr (10/16/20 0800)  . feeding supplement (VITAL 1.5 CAL) Stopped (10/16/20 0800)  . fentaNYL infusion INTRAVENOUS 200 mcg/hr (10/16/20 0800)  . norepinephrine (LEVOPHED) Adult infusion Stopped (10/16/20 0146)  . vasopressin 0.01 Units/min (10/16/20 0800)   PRN Meds:.Place/Maintain arterial line **AND** sodium chloride, sodium chloride,  acetaminophen (TYLENOL) oral liquid 160 mg/5 mL, midazolam, ondansetron **OR** ondansetron (ZOFRAN) IV, sodium chloride flush  Plan:   Patient with multiorgan failure, hemodynamics have improved.  This morning cardiac output of 7.60 and cardiac index of 4.17.  Would continue Swan-Ganz placement for at least another 24 hours until he is stable.  Presently on appropriate antibiotic therapy and includes therapy for probable atypical pneumonia, presently being treated with doxycycline.  Not showing up on the med list today but will follow up on this.  His wedge is slightly elevated today did get 1 dose of furosemide per Dr. Pierre Bali.  Updated his son regarding improvement in overall hemodynamics and also renal function.  Does have mild ileus, may need soapsuds enema if appropriate.  We will continue to follow along.    Adrian Prows, MD, [facility] 10/16/2020, 9:01 AM Office: [phone] Pager: [phone]

## 2020-10-16 NOTE — Progress Notes (Signed)
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 He had no operative complications and was discharged home. He returned to [facility] 10 days post-op due to fatigue, weakness, and poorly controlled back pain. He was also found to have sick contact and he was admit to neurosurgery service for pain control, IV hydration and possible dispo to CIR.   Past Medical History  CAD, CKD3, HTN, [facility] Events   11/03/2020 Admisson  Consults:  Neurosurg  Procedures:  10/13/20 Intubation 12/5 - cVL R Femoral 12/5 bronch Significant Diagnostic Tests:  10/11/20 Chest X-ray, minimal left basilar atelectasis 12/3 LE venous doppler: neg 12/4 echo: LVEF 25-30%  Micro Data:  11/04/2020 COVID, Flu neg 12/4:  MRSA PCR neg 12/4 Bronch tracheal aspirate - GNR, GPC growing 12/4 blood: ngtd  Antimicrobials:  Vanc 12/5 >. 12/5 (MRSA PCR neg and has AKI) Cefepime 12.5 >>  Interim history/subjective:  Episode of mucus plugging overnight, now on higher FiO2.  Objective   Blood pressure 129/71, pulse 86, temperature 97.7 F (36.5 C), temperature source Core, resp. rate (!) 25, height 5\' 7"  (1.702 m), weight 70.4 kg, SpO2 95 %. PAP: (30-56)/(10-33) 54/24 CVP:  [8 mmHg-11 mmHg] 8 mmHg  Vent Mode: PRVC FiO2 (%):  [40 %-60 %] 60 % Set Rate:  [20 bmp] 20 bmp Vt Set:  [520 mL] 520 mL PEEP:  [5 cmH20-8 cmH20] 5 cmH20 Pressure Support:  [8 cmH20] 8 cmH20 Plateau Pressure:  [18 cmH20-21 cmH20] 18 cmH20   Intake/Output Summary (Last 24 hours) at 10/16/2020 0708 Last data filed at 10/16/2020 0400 Gross per 24 hour  Intake 1657.38 ml  Output 905 ml   Net 752.38 ml   Filed Weights   10/12/20 1413 11/08/2020 0500 10/16/20 0432  Weight: 70.3 kg 69.5 kg 70.4 kg   Constitutional: no acute distress lying in bed  Eyes: pinpoint, reactive not tracking Ears, nose, mouth, and throat: ETT no secretions Cardiovascular: RRR, ext warm Respiratory: Rhonci R>L, no accessory muscle use Gastrointestinal: Soft, hypoactive BS Skin: No rashes, normal turgor Neurologic: Withdraws x 4 Psychiatric: RASS -4   CXR R>L infiltrates Kidney function stable Pct improving Sugars up  Resolved Hospital Problem list     Assessment & Plan:   L5-S1 DJD s/p laminectomy/decompression Post-op day 16. Per neurosurgery, no operative complications. Difficulty with post-op deconditioning and pain control. - On sedation for intubation - Per neurosurgery - Will need to find happy balance between pain control and pulmonary toileting once off vent  Acute hypoxic respiratory failure due to acute pulm edema and likely aspiration pneumonitis.  Mucus plugging overnight. - Bronch this AM to make sure RUL is open and get better culture specimen - I am a bit hesitant to get him off vent until he starts having regular BMs but mobility may be best way to approach  Constipation- bowel regimen strengthened as ordered, KUB benign  [place]o's- management per cardiology, does not look too wet, dose of lasix per CHF team  AKI due to ATN- making urine, +4.5 L for admission, monitor, avoid nephrotoxins  DM2- A1c 7.7%, hyperglycemic - Levemir, SSI  Anemia- should be fine  to rechallenge with heparin dvt ppx dose, monitor for s/s of bleeding   Best practice (evaluated daily)   Diet: TF Pain/Anxiety/Delirium protocol (if indicated): Fentanyl and precedex gtt VAP protocol (if indicated): ordered DVT prophylaxis: heparin GI prophylaxis: PPI Glucose control: see above Mobility: BR Code Status: Full Disposition: ICU  Family updated at bedside.  Patient critically ill due  to respiratory failure Interventions to address this today vent titration Risk of deterioration without these interventions is high  I personally spent 36 minutes providing critical care not including any separately billable procedures  Erskine Emery MD [facility] 10/16/2020 7:42 AM Personal pager: [phone] If unanswered, please page CCM On-call: [phone]

## 2020-10-16 NOTE — Progress Notes (Signed)
At approx 0110 Pt experienced desaturation episode and was satting at about 85% on 40% 02. Pt BP was also significantly dropping w/ MAP <60. Sedation was immediatly stopped and 02 breaths given. ETT suction performed with no success. RT contacted and upon arrival performed lavage and was able to suction a large mucous plug from ETT. For hypotension, a 80 mcg IV push of neosynephrine was administered and a levo gtt was started per E-link MD. Pt has since been titrated off levo gtt and sedation has been resumed at a lower dose. Morning labs have been drawn early and ABG/CXR was performed. Will continue to closely monitor.

## 2020-10-16 NOTE — Progress Notes (Signed)
OT Cancellation Note  Patient Details Name: Dennis Carey MRN: [id number] DOB: [birth year]   Cancelled Treatment:    Reason Eval/Treat Not Completed: Medical issues which prohibited therapy (Intubated and sedated. Will sign off.) Please re-order as medically ready. Thank you.   [place], OTR/L Acute Rehab Pager: [phone] Office: [phone] 10/16/2020, 1:42 PM

## 2020-10-17 ENCOUNTER — Inpatient Hospital Stay (HOSPITAL_COMMUNITY): Payer: Medicare Other

## 2020-10-17 DIAGNOSIS — T17500A Unspecified foreign body in bronchus causing asphyxiation, initial encounter: Secondary | ICD-10-CM

## 2020-10-17 DIAGNOSIS — R0602 Shortness of breath: Secondary | ICD-10-CM

## 2020-10-17 DIAGNOSIS — A419 Sepsis, unspecified organism: Secondary | ICD-10-CM | POA: Diagnosis not present

## 2020-10-17 DIAGNOSIS — R6521 Severe sepsis with septic shock: Secondary | ICD-10-CM | POA: Diagnosis not present

## 2020-10-17 DIAGNOSIS — R570 Cardiogenic shock: Secondary | ICD-10-CM | POA: Diagnosis not present

## 2020-10-17 DIAGNOSIS — J9601 Acute respiratory failure with hypoxia: Secondary | ICD-10-CM | POA: Diagnosis not present

## 2020-10-17 LAB — COOXEMETRY PANEL
Carboxyhemoglobin: 1.2 % (ref 0.5–1.5)
Methemoglobin: 0.7 % (ref 0.0–1.5)
O2 Saturation: 69.1 %
Total hemoglobin: 6.5 g/dL — CL (ref 12.0–16.0)

## 2020-10-17 LAB — CBC
HCT: 25.3 % — ABNORMAL LOW (ref 39.0–52.0)
Hemoglobin: 7.7 g/dL — ABNORMAL LOW (ref 13.0–17.0)
MCH: 29.1 pg (ref 26.0–34.0)
MCHC: 30.4 g/dL (ref 30.0–36.0)
MCV: 95.5 fL (ref 80.0–100.0)
Platelets: 192 10*3/uL (ref 150–400)
RBC: 2.65 MIL/uL — ABNORMAL LOW (ref 4.22–5.81)
RDW: 12.5 % (ref 11.5–15.5)
WBC: 9.4 10*3/uL (ref 4.0–10.5)
nRBC: 0.2 % (ref 0.0–0.2)

## 2020-10-17 LAB — POCT I-STAT 7, (LYTES, BLD GAS, ICA,H+H)
Acid-Base Excess: 1 mmol/L (ref 0.0–2.0)
Bicarbonate: 24.9 mmol/L (ref 20.0–28.0)
Calcium, Ion: 1.08 mmol/L — ABNORMAL LOW (ref 1.15–1.40)
HCT: 22 % — ABNORMAL LOW (ref 39.0–52.0)
Hemoglobin: 7.5 g/dL — ABNORMAL LOW (ref 13.0–17.0)
O2 Saturation: 92 %
Patient temperature: 37.8
Potassium: 3.7 mmol/L (ref 3.5–5.1)
Sodium: 145 mmol/L (ref 135–145)
TCO2: 26 mmol/L (ref 22–32)
pCO2 arterial: 39.3 mmHg (ref 32.0–48.0)
pH, Arterial: 7.414 (ref 7.350–7.450)
pO2, Arterial: 64 mmHg — ABNORMAL LOW (ref 83.0–108.0)

## 2020-10-17 LAB — GLUCOSE, CAPILLARY
Glucose-Capillary: 130 mg/dL — ABNORMAL HIGH (ref 70–99)
Glucose-Capillary: 198 mg/dL — ABNORMAL HIGH (ref 70–99)
Glucose-Capillary: 199 mg/dL — ABNORMAL HIGH (ref 70–99)
Glucose-Capillary: 230 mg/dL — ABNORMAL HIGH (ref 70–99)
Glucose-Capillary: 260 mg/dL — ABNORMAL HIGH (ref 70–99)
Glucose-Capillary: 287 mg/dL — ABNORMAL HIGH (ref 70–99)
Glucose-Capillary: 73 mg/dL (ref 70–99)

## 2020-10-17 LAB — BASIC METABOLIC PANEL
Anion gap: 8 (ref 5–15)
BUN: 38 mg/dL — ABNORMAL HIGH (ref 8–23)
CO2: 24 mmol/L (ref 22–32)
Calcium: 7.2 mg/dL — ABNORMAL LOW (ref 8.9–10.3)
Chloride: 108 mmol/L (ref 98–111)
Creatinine, Ser: 1.22 mg/dL (ref 0.61–1.24)
GFR, Estimated: 59 mL/min — ABNORMAL LOW (ref >60)
Glucose, Bld: 311 mg/dL — ABNORMAL HIGH (ref 70–99)
Potassium: 3.3 mmol/L — ABNORMAL LOW (ref 3.5–5.1)
Sodium: 140 mmol/L (ref 135–145)

## 2020-10-17 LAB — LACTIC ACID, PLASMA: Lactic Acid, Venous: 1.8 mmol/L (ref 0.5–1.9)

## 2020-10-17 LAB — PHOSPHORUS
Phosphorus: 1.5 mg/dL — ABNORMAL LOW (ref 2.5–4.6)
Phosphorus: 3.5 mg/dL (ref 2.5–4.6)

## 2020-10-17 MED ORDER — ETOMIDATE 2 MG/ML IV SOLN
INTRAVENOUS | Status: AC
  Administered 2020-10-17: 20 mg via INTRAVENOUS
  Filled 2020-10-17: qty 10

## 2020-10-17 MED ORDER — ROCURONIUM BROMIDE 10 MG/ML (PF) SYRINGE: 80.0000 mg | PREFILLED_SYRINGE | Freq: Once | INTRAVENOUS | Status: AC

## 2020-10-17 MED ORDER — FENTANYL CITRATE (PF) 100 MCG/2ML IJ SOLN
50.0000 ug | Freq: Once | INTRAMUSCULAR | Status: AC
  Administered 2020-10-17: 50 ug via INTRAVENOUS

## 2020-10-17 MED ORDER — POTASSIUM PHOSPHATES 15 MMOLE/5ML IV SOLN
45.0000 mmol | Freq: Once | INTRAVENOUS | Status: AC
  Administered 2020-10-17: 45 mmol via INTRAVENOUS
  Filled 2020-10-17: qty 15

## 2020-10-17 MED ORDER — HALOPERIDOL LACTATE 5 MG/ML IJ SOLN: 5.0000 mg | Freq: Once | INTRAMUSCULAR | Status: AC

## 2020-10-17 MED ORDER — ROCURONIUM BROMIDE 10 MG/ML (PF) SYRINGE
PREFILLED_SYRINGE | INTRAVENOUS | Status: AC
  Administered 2020-10-17: 80 mg via INTRAVENOUS
  Filled 2020-10-17: qty 10

## 2020-10-17 MED ORDER — POTASSIUM CHLORIDE CRYS ER 20 MEQ PO TBCR: 40.0000 meq | EXTENDED_RELEASE_TABLET | Freq: Once | ORAL | Status: DC

## 2020-10-17 MED ORDER — LABETALOL HCL 5 MG/ML IV SOLN
INTRAVENOUS | Status: AC
  Administered 2020-10-17: 5 mg via INTRAVENOUS
  Filled 2020-10-17: qty 4

## 2020-10-17 MED ORDER — METHYLNALTREXONE BROMIDE 12 MG/0.6ML ~~LOC~~ SOLN
12.0000 mg | Freq: Once | SUBCUTANEOUS | Status: AC
  Administered 2020-10-17: 12 mg via SUBCUTANEOUS
  Filled 2020-10-17: qty 0.6

## 2020-10-17 MED ORDER — VANCOMYCIN HCL 750 MG/150ML IV SOLN
750.0000 mg | Freq: Two times a day (BID) | INTRAVENOUS | Status: DC
  Administered 2020-10-17 – 2020-10-19 (×5): 750 mg via INTRAVENOUS
  Filled 2020-10-17 (×6): qty 150

## 2020-10-17 MED ORDER — ETOMIDATE 2 MG/ML IV SOLN: 20.0000 mg | Freq: Once | INTRAVENOUS | Status: AC

## 2020-10-17 MED ORDER — HALOPERIDOL LACTATE 5 MG/ML IJ SOLN
INTRAMUSCULAR | Status: AC
  Administered 2020-10-17: 5 mg via INTRAVENOUS
  Filled 2020-10-17: qty 1

## 2020-10-17 MED ORDER — LABETALOL HCL 5 MG/ML IV SOLN
5.0000 mg | INTRAVENOUS | Status: DC | PRN
  Administered 2020-10-18 (×3): 5 mg via INTRAVENOUS
  Filled 2020-10-17 (×3): qty 4

## 2020-10-17 MED ORDER — POTASSIUM CHLORIDE 20 MEQ PO PACK
40.0000 meq | PACK | Freq: Once | ORAL | Status: AC
  Administered 2020-10-17: 40 meq
  Filled 2020-10-17: qty 2

## 2020-10-17 MED ORDER — FENTANYL CITRATE (PF) 100 MCG/2ML IJ SOLN
100.0000 ug | Freq: Once | INTRAMUSCULAR | Status: AC
  Administered 2020-10-17: 100 ug via INTRAVENOUS

## 2020-10-17 NOTE — Progress Notes (Signed)
Pt reintubated by CCM w/ no apparent complications,  + easy cap color change (purple to yellow), + BBSH coarse diminished = t/o.  Sat 96%.  See CCM MD and PA note.

## 2020-10-17 NOTE — Progress Notes (Signed)
[facility] Progress Note Patient Name: Dennis Carey DOB: [birth year] MRN: [id number]   Date of Service  10/17/2020  HPI/Events of Note  K+ 3.3, Phos 1.5.  eICU Interventions  K+ Phos ordered per adult electrolyte replacement protocol.        [name] [name] 10/17/2020, 4:18 AM

## 2020-10-17 NOTE — Progress Notes (Signed)
Pharmacy Antibiotic Note  Dennis Carey is a 85 y.o. male admitted on 10/22/2020 with back s/p L5-S1 decompression and fusion 10 days prior. Pharmacy has been consulted for cefepime dosing for pneumonia.   The patient's SCr currently stable at 1.5, CrCl~30-35 ml/min. Will continue to monitor trends in renal function closely. Tmax 100.7 overnight, wbc stable at 10.   Cardiology had a discussion with critical care on 12/6 and added doxy with concern for atypical pneumonia. No dose adjustments needed, will follow up length of therapy.   12/8 - pharmacy asked to resume vancomycin  Plan: - Vancomycin 750 mg IV q 12 hrs - Continue Cefepime 2g IV every 12 hours  - Doxy 100 mg q12 hours for now - Will continue to follow renal function, culture results, LOT, and antibiotic de-escalation plans   Height: 5\' 7"  (170.2 cm) Weight: 75.9 kg (167 lb 5.3 oz) IBW/kg (Calculated) : 66.1  Temp (24hrs), Avg:98.2 F (36.8 C), Min:96.4 F (35.8 C), Max:101.1 F (38.4 C)  Recent Labs  Lab 10/13/20 1801 10/14/20 0345 10/14/20 1050 10/14/20 1550 10/20/2020 0547 10/16/20 0158 10/16/20 0947 10/17/20 0308 10/17/20 0427  WBC  --  10.3  --  12.6* 9.8 10.6*  --  9.4  --   CREATININE  --  1.81*  --  1.72* 1.53* 1.51*  --  1.22  --   LATICACIDVEN   < >  --  4.6* 2.8* 2.6*  --  3.4*  --  1.8   < > = values in this interval not displayed.    Estimated Creatinine Clearance: 42.9 mL/min (by C-G formula based on SCr of 1.22 mg/dL).    Allergies  Allergen Reactions  . Sulfa Antibiotics Hives    Antimicrobials this admission: Vancomycin 12/4 x1 Cefepime 12/4 >>  Doxy 12/6>>  Microbiology results: 12/4 MRSA PCR - negative 12/4 BCx - ngtd 12/4 TA -  mod GPC+ GNR, abund WBC (reicnubated)>nf 12/5 TA - reincubate 12/7 TA    Thank you for allowing pharmacy to be a part of this patient's care.  [name], [name], BCCP Clinical Pharmacist  10/17/2020 3:08 PM   [facility] pharmacy phone numbers are  listed on [email or web address]    **Pharmacist phone directory can now be found on [email or web address] ([email or web address]).  Listed under [place].

## 2020-10-17 NOTE — Progress Notes (Signed)
No events or problems overnight.  Patient remains on ventilator although he sedation is being allowed to wean.  He is afebrile.  Blood pressure low normal.  Heart rate normal.  Urine output good.  Patient did have some small bowel movements yesterday.  White both cell count normal.  Creatinine is returned to baseline.  Hematocrit stable.  Overall progressing about as well as could be hoped.  Tentatively plan to work toward extubation today.  No new recommendations from my standpoint.

## 2020-10-17 NOTE — Progress Notes (Signed)
Subjective:  Intubated and sedated.  Mucous plugging last night, Off all pressors. Received one dose lasix last night with excellent UOP.    Intake/Output from previous day:  I/O last 3 completed shifts: In: 3411.6 [I.V.:1195.7; NG/GT:1056.8; IV Piggyback:1159.1] Out: 1660 [Urine:4720] Total I/O In: 155.8 [I.V.:24.9; NG/GT:45; IV Piggyback:85.9] Out: -   Blood pressure 117/63, pulse 86, temperature 98.2 F (36.8 C), resp. rate 19, height _0  (1.702 m), weight 75.9 kg, SpO2 96 %.   Vitals with BMI 10/17/2020 10/17/2020 10/17/2020  Height - - -  Weight - - -  BMI - - -  Systolic 630 - 97  Diastolic 63 - 60  Pulse 86 85 68    Physical Exam Constitutional:      Interventions: He is sedated and intubated.  Cardiovascular:     Rate and Rhythm: Normal rate and regular rhythm.     Pulses: Intact distal pulses.     Heart sounds: Normal heart sounds. No murmur heard.  No gallop.      Comments: No leg edema, no JVD. Pulmonary:     Effort: Pulmonary effort is normal. He is intubated.     Breath sounds: Rhonchi (bilateral diffuse) present.  Abdominal:     General: There is distension.     Palpations: Abdomen is soft.     Comments: Bowel sounds sluggish  Musculoskeletal:        General: No swelling or deformity.    Lab Results: BMP BNP (last 3 results) Recent Labs    10/13/20 1513  BNP 589.0*    ProBNP (last 3 results) No results for input(s): PROBNP in the last 8760 hours. BMP Latest Ref Rng & Units 10/17/2020 10/16/2020 10/16/2020  Glucose 70 - 99 mg/dL 311(H) 329(H) -  BUN 8 - 23 mg/dL 38(H) 54(H) -  Creatinine 0.61 - 1.24 mg/dL 1.22 1.51(H) -  Sodium 135 - 145 mmol/L 140 141 142  Potassium 3.5 - 5.1 mmol/L 3.3(L) 4.1 3.5  Chloride 98 - 111 mmol/L 108 107 -  CO2 22 - 32 mmol/L 24 25 -  Calcium 8.9 - 10.3 mg/dL 7.2(L) 7.1(L) -   Hepatic Function Latest Ref Rng & Units 10/16/2020 10/14/2020 10/14/2020  Total Protein 6.5 - 8.1 g/dL 5.0(L) 5.0(L) 5.0(L)  Albumin 3.5 - 5.0  g/dL 1.9(L) 2.2(L) 2.4(L)  AST 15 - 41 U/L 43(H) 42(H) 43(H)  ALT 0 - 44 U/L 40 36 38  Alk Phosphatase 38 - 126 U/L 58 60 62  Total Bilirubin 0.3 - 1.2 mg/dL 1.1 1.4(H) 1.3(H)   CBC Latest Ref Rng & Units 10/17/2020 10/16/2020 10/16/2020  WBC 4.0 - 10.5 K/uL 9.4 10.6(H) -  Hemoglobin 13.0 - 17.0 g/dL 7.7(L) 8.3(L) 7.5(L)  Hematocrit 39 - 52 % 25.3(L) 25.9(L) 22.0(L)  Platelets 150 - 400 K/uL 192 211 -   Lipid Panel  No results found for: CHOL, TRIG, HDL, CHOLHDL, VLDL, LDLCALC, LDLDIRECT Cardiac Panel (last 3 results) No results for input(s): CKTOTAL, CKMB, TROPONINI, RELINDX in the last 72 hours.  HEMOGLOBIN A1C Lab Results  Component Value Date   HGBA1C 7.7 (H) 10/12/2020   MPG 174.29 10/12/2020   TSH No results for input(s): TSH in the last 8760 hours. Imaging: Imaging results have been reviewed  DG Chest 1 View  10/13/2020 CLINICAL DATA:  Hypoxemia EXAM: CHEST  1 VIEW COMPARISON:  Radiograph 10/13/2020 FINDINGS: Endotracheal tube terminates in the mid trachea, 5.5 cm from the carina. Transesophageal tube tip and side port are distal to the GE junction, terminating  below the margins of imaging. Telemetry leads and external support devices overlie the chest. Redemonstration of the heterogeneous opacity in both lungs,, right greater than left with some slight interval clearing in the right mid lung, possibly related to improving lung volumes when compared to prior study. No visible pneumothorax or effusion. The aorta is calcified. The remaining cardiomediastinal contours are unremarkable. No acute osseous or soft tissue abnormality.  IMPRESSION: 1. Persistent bilateral, opacities, right greater than left. Slight interval clearing in the right mid lung, possibly related to improving lung volumes when compared to prior study. 2. Support devices as above. Electronically Signed   By: Lovena Le M.D.   On: 10/13/2020 23:00    DG Chest 1 View  10/14/2020  1. Stable support apparatus. 2.  Persistent bilateral lung infiltrates.   DG Chest 1 View  10/16/2020   Lines and tubes in satisfactory position.   Unchanged multifocal airspace opacities  Lower Extremity Venous Duplex  10/13/2020: Summary: RIGHT: - There is no evidence of deep vein thrombosis in the lower extremity.  - No cystic structure found in the popliteal fossa.  LEFT: - There is no evidence of deep vein thrombosis in the lower extremity.  - No cystic structure found in the popliteal fossa.  *See table(s) above for measurements and observations. Electronically signed by Harold Barban MD on 10/13/2020 at 5:35:09 PM. Cardiac Studies:   Echocardiogram 10/13/2020:  1. Left ventricular ejection fraction, by estimation, is 25 to 30%. The left ventricle has severely decreased function. The left ventricle demonstrates regional wall motion abnormalities (see scoring diagram/findings for description). Left ventricular  diastolic parameters are indeterminate. 2. Right ventricular systolic function is normal. The right ventricular size is normal. There is normal pulmonary artery systolic pressure. 3. The mitral valve is grossly normal. Trivial mitral valve regurgitation. No evidence of mitral stenosis. 4. The aortic valve is grossly normal. There is mild calcification of the aortic valve. Aortic valve regurgitation is not visualized. No aortic stenosis is present. 5. The inferior vena cava is normal in size with <50% respiratory variability, suggesting right atrial pressure of 8 mmHg.   EKG:  EKG 10/14/2020: Normal sinus rhythm at rate of 69 bpm, normal axis, poor R wave progression, cannot exclude anteroseptal infarct old.  T wave abnormality, anterolateral ischemia.  Compared to April 19, 2020, poor R wave progression and T wave abnormality new.  Compared to yesterday, tibial abnormality In the anterolateral leads is new.  Assessment    1.    Combination of cardiogenic and septic shock.  He is now off of pressors.  CVP 6-8 and wedge 16-18 and preserved COP and CI at 2.72 and 4.96. Lactate normal today and renal function to baseline CKD 3 a 2.  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  Echocardiogram personally reviewed, mid to distal anterior, anteroapical, mid to distal inferior and inferoapical akinesis.  Troponin leak out of proportion to severe LV systolic dysfunction. 3.  Lung infiltrates, chest x-ray repeated, right lung infiltrate persist, and appear more dense, consolidation of pneumonia.  Large mucous plug aspirated yesterday 4.  Abdominal distention and  bowel sounds have improved today and received lactulose and also enema yesterday and had small BM x 2,     Scheduled Meds: . aspirin  81 mg Per Tube Daily  . bisacodyl  10 mg Rectal Daily  . chlorhexidine gluconate (MEDLINE KIT)  15 mL Mouth Rinse BID  . Chlorhexidine Gluconate Cloth  6 each Topical Daily  . diazepam  2.5  mg Per Tube Q6H  . ezetimibe  10 mg Per Tube Daily  . feeding supplement (PROSource TF)  90 mL Per Tube BID  . heparin injection (subcutaneous)  5,000 Units Subcutaneous Q8H  . insulin aspart  0-15 Units Subcutaneous Q4H  . insulin aspart  3 Units Subcutaneous Q4H  . insulin detemir  15 Units Subcutaneous BID  . ipratropium-albuterol  3 mL Nebulization Q6H  . lactulose  30 g Per Tube BID  . mouth rinse  15 mL Mouth Rinse 10 times per day  . methylnaltrexone  12 mg Subcutaneous Once  . neostigmine  0.25 mg Subcutaneous Q6H  . pantoprazole sodium  40 mg Per Tube Q1200  . polyethylene glycol  17 g Per Tube Daily  . pregabalin  75 mg Per Tube BID  . rosuvastatin  20 mg Per Tube Daily  . sodium chloride flush  10-40 mL Intracatheter Q12H  . sodium chloride flush  3 mL Intravenous Q12H   Continuous Infusions: . sodium chloride    . sodium chloride    . ceFEPime (MAXIPIME) IV Stopped (10/16/20 2203)  . dexmedetomidine (PRECEDEX) IV infusion 0.6 mcg/kg/hr (10/17/20 0800)  . doxycycline (VIBRAMYCIN) IV Stopped (10/16/20  2339)  . feeding supplement (VITAL 1.5 CAL) Stopped (10/17/20 0800)  . fentaNYL infusion INTRAVENOUS 125 mcg/hr (10/17/20 0800)  . norepinephrine (LEVOPHED) Adult infusion Stopped (10/16/20 0146)  . potassium PHOSPHATE IVPB (in mmol) 86 mL/hr at 10/17/20 0800  . vasopressin Stopped (10/16/20 0836)   PRN Meds:.Place/Maintain arterial line **AND** sodium chloride, sodium chloride, acetaminophen (TYLENOL) oral liquid 160 mg/5 mL, midazolam, ondansetron **OR** ondansetron (ZOFRAN) IV, sodium chloride flush, sodium chloride flush  Plan:   As noted in my impression, hemodynamics are improving, lactic acid is now normalized and her function is improved.  Good perfusion to the lower extremity with brisk capillary filling.  Chest x-ray reveals much more dense opacity, probably consolidation phase of pneumonia.  Good urine output, from cardiac standpoint if Swan-Ganz need to be discontinued I do not have any objections.  Plans on weaning him off of ventilator soon.  We will continue to follow.   Adrian Prows, MD, [facility] 10/17/2020, 9:06 AM Office: [phone] Pager: [phone]

## 2020-10-17 NOTE — Procedures (Addendum)
Extubation Procedure Note  Patient Details:   Name: Dennis Carey DOB: 09-18-1935 MRN: [id number]   Airway Documentation:   + cuff leak test prior to extubation.  Vent end date: 10/17/20 Vent end time: 1045   Evaluation  O2 sats: stable throughout Complications: No apparent complications Patient did tolerate procedure well. Bilateral Breath Sounds: Diminished (coarse)   Yes, pt able to speak, no stridor noted.  Pt developed tachypnea and dropped sat to 89% within a couple of minutes s/p extubation.  See note, MD at bedside to eval pt.   Lenna Sciara 10/17/2020, 11:02 AM

## 2020-10-17 NOTE — Progress Notes (Signed)
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 He had no operative complications and was discharged home. He returned to [facility] 10 days post-op due to fatigue, weakness, and poorly controlled back pain. He was also found to have sick contact and he was admit to neurosurgery service for pain control, IV hydration and possible dispo to CIR.   Past Medical History  CAD, CKD3, HTN, [place] [facility] Events   10/30/2020 Admisson  Consults:  Neurosurg  Procedures:  10/13/20 Intubation 12/5 - cVL R Femoral 12/5 bronch Significant Diagnostic Tests:  10/11/20 Chest X-ray, minimal left basilar atelectasis 12/3 LE venous doppler: neg 12/4 echo: LVEF 25-30%  Micro Data:  10/28/2020 COVID, Flu neg 12/4:  MRSA PCR neg 12/4 Bronch tracheal aspirate - GNR, GPC growing 12/4 blood: ngtd  Antimicrobials:  Vanc 12/5 >. 12/5 (MRSA PCR neg and has AKI) Cefepime 12.5 >>  Interim history/subjective:  No events. Having some BM but still distended.  Objective   Blood pressure 97/60, pulse 85, temperature 98.2 F (36.8 C), resp. rate 18, height 5\' 7"  (1.702 m), weight 75.9 kg, SpO2 97 %. PAP: (27-44)/(12-21) 32/12 CVP:  [1 mmHg-16 mmHg] 1 mmHg CO:  [5 L/min-6.3 L/min] 5 L/min CI:  [2.7 L/min/m2-3.5 L/min/m2] 2.7 L/min/m2  Vent Mode: CPAP;PSV FiO2 (%):  [40 %] 40 % Set Rate:  [20 bmp] 20 bmp Vt Set:  [520 mL] 520 mL PEEP:  [5 cmH20] 5 cmH20 Pressure Support:  [5 cmH20] 5 cmH20 Plateau Pressure:  [15 cmH20-16 cmH20] 15 cmH20   Intake/Output Summary (Last 24 hours) at 10/17/2020 0851 Last data filed at 10/17/2020 0800 Gross per 24 hour   Intake 2706.08 ml  Output 3795 ml  Net -1088.92 ml   Filed Weights   10/30/2020 0500 10/16/20 0432 10/17/20 0500  Weight: 69.5 kg 70.4 kg 75.9 kg   Constitutional: no acute distress  Eyes: EOMI, pupils equal Ears, nose, mouth, and throat: ETT in place, less secretions today Cardiovascular: RRR, ext warm Respiratory: R>L rhonci, triggering vent Gastrointestinal: distended, hypoactive BS persist Skin: No rashes, normal turgor Neurologic: slowly waking up from sedation Psychiatric: RASS -1  Renal function improved Diuresed well H/H slightly down CXR stable  Resolved Hospital Problem list     Assessment & Plan:   L5-S1 DJD s/p laminectomy/decompression Post-op day 16. Per neurosurgery, no operative complications. Difficulty with post-op deconditioning and pain control. - Per neurosurgery - Will need to find happy balance between pain control and pulmonary toileting once off vent  Acute hypoxic respiratory failure due to acute pulm edema and likely aspiration pneumonitis.  Mucus plugging overnight. - Work on weaning sedation and SBT today, biggest barriers will be cough strength, deconditioning, and ongoing constipation reducing FRC - Continue doxycycline per cardiology and cefepime from my standpoint, 7 day duration is fine  Constipation- continue strengthened bowel regimen  Takutsubo's- management per cardiology, seems to be diuresing  AKI due to ATN- improving, may benefit from another dose of lasix but will wait for Dr. Missy Sabins to weigh in  DM2- A1c 7.7%, hyperglycemic still - Levemir, SSI, may increase if taking PO later today  Anemia- should  be fine to rechallenge with heparin dvt ppx dose, monitor for s/s of bleeding, H/H 8.3>>7.7 today, continue for now   Best practice (evaluated daily)   Diet: TF Pain/Anxiety/Delirium protocol (if indicated): Wean VAP protocol (if indicated): ordered DVT prophylaxis: heparin GI prophylaxis: PPI Glucose control: see  above Mobility: BR Code Status: Full Disposition: ICU  Family updated at bedside.  Patient critically ill due to respiratory failure Interventions to address this today vent titration Risk of deterioration without these interventions is high  I personally spent 34 minutes providing critical care not including any separately billable procedures  Erskine Emery MD [place] [facility] 10/17/2020 8:51 AM Personal pager: [phone] If unanswered, please page CCM On-call: [phone]

## 2020-10-17 NOTE — Procedures (Signed)
Intubation Procedure Note  Dennis Carey  [id number]  1935/04/01  Date:10/17/20  Time:1:22 PM   Provider Performing:Mete Purdum R Benedetta Sundstrom    Procedure: Intubation (31500)  Indication(s) Respiratory Failure  Consent Risks of the procedure as well as the alternatives and risks of each were explained to the patient and/or caregiver.  Consent for the procedure was obtained and is signed in the bedside chart   Anesthesia Etomidate, Versed, Fentanyl and Rocuronium   Time Out Verified patient identification, verified procedure, site/side was marked, verified correct patient position, special equipment/implants available, medications/allergies/relevant history reviewed, required imaging and test results available.   Sterile Technique Usual hand hygeine, masks, and gloves were used   Procedure Description Patient positioned in bed supine.  Sedation given as noted above.  Patient was intubated with endotracheal tube using Glidescope.  View was Grade 1 full glottis .  Number of attempts was 1.  Colorimetric CO2 detector was consistent with tracheal placement.   Complications/Tolerance None; patient tolerated the procedure well. Chest X-ray is ordered to verify placement.   EBL Minimal   Specimen(s) None  Dennis Carpen Ledger Heindl, PA-C

## 2020-10-17 NOTE — Progress Notes (Signed)
Unfortunately after extubation agitated, unable to handle secretions.  Trials of BIPAP with precedex did not alleviate WOB.  Bps then climbed to 579U systolic, RR remained in 38B.  C/o abdominal fullness and inability to expectorate.  Decision made to reintubate and allow constipation and pneumonia more time to improve before attempting again.  Will add vanc while we await BAL data.  Daughter at bedside for all of above and will update the rest of family.  Erskine Emery MD PCCM

## 2020-10-17 NOTE — Progress Notes (Addendum)
Advanced Heart Failure Rounding Note   Subjective:    Remains intubated. Weaning sedation so now agitated   Bronch yesterday with copious secretions that were removed. Cultures pending.   Of NE and VP. Lactate 3.4 -> 1.8 Co-ox 69% CVP ~5  Swan numbers (done personally):  CVP 6 PA 34/15 Thermo 5.0/2.7   Objective:   Weight Range:  Vital Signs:   Temp:  [96.4 F (35.8 C)-99.3 F (37.4 C)] 98.6 F (37 C) (12/08 1000) Pulse Rate:  [65-89] 82 (12/08 1000) Resp:  [16-26] 21 (12/08 1000) BP: (95-130)/(43-63) 114/59 (12/08 1000) SpO2:  [94 %-100 %] 96 % (12/08 1000) Arterial Line BP: (108-152)/(34-65) 147/47 (12/08 1000) FiO2 (%):  [40 %] 40 % (12/08 0800) Weight:  [75.9 kg] 75.9 kg (12/08 0500) Last BM Date: 10/16/20  Weight change: Filed Weights   10/28/2020 0500 10/16/20 0432 10/17/20 0500  Weight: 69.5 kg 70.4 kg 75.9 kg    Intake/Output:   Intake/Output Summary (Last 24 hours) at 10/17/2020 1030 Last data filed at 10/17/2020 0900 Gross per 24 hour  Intake 2605.01 ml  Output 3895 ml  Net -1289.99 ml     Physical Exam: General: Elderly. Ill appearing on vent agitated HEENT: + ETT Neck: supple.RIJ swan Carotids 2+ bilat; no bruits. No lymphadenopathy or thryomegaly appreciated. Cor: PMI nondisplaced. Regular rate & rhythm. No rubs, gallops or murmurs. Lungs: coarse Abdomen: soft, nontender, nondistended. No hepatosplenomegaly. No bruits or masses. Good bowel sounds. Extremities: no cyanosis, clubbing, rash, edema Neuro: agitated on vent. Moves all 4   Telemetry: sinus 80s Personally reviewed  Labs: Basic Metabolic Panel: Recent Labs  Lab 10/14/20 0345 10/14/20 0345 10/14/20 1550 10/14/20 1550 10/17/2020 0547 10/12/2020 1114 10/30/2020 1116 11/03/2020 1122 10/16/20 0143 10/16/20 0158 10/17/20 0308  NA 142   < > 141   < > 140   < > 143 142 142 141 140  K 4.2   < > 3.4*   < > 3.5   < > 3.5 3.5 3.5 4.1 3.3*  CL 105  --  102  --  104  --   --   --   --   107 108  CO2 22  --  26  --  25  --   --   --   --  25 24  GLUCOSE 221*  --  213*  --  252*  --   --   --   --  329* 311*  BUN 46*  --  42*  --  47*  --   --   --   --  54* 38*  CREATININE 1.81*  --  1.72*  --  1.53*  --   --   --   --  1.51* 1.22  CALCIUM 7.0*   < > 6.7*   < > 7.4*  --   --   --   --  7.1* 7.2*  MG  --   --   --   --  1.9  --   --   --   --   --   --   PHOS  --   --   --   --  2.7  --   --   --   --  2.6 1.5*   < > = values in this interval not displayed.    Liver Function Tests: Recent Labs  Lab 10/22/2020 1759 10/13/20 1513 10/14/20 0345 10/14/20 1550 11/08/2020 0547  AST 29 56* 43* 42*  43*  ALT 24 43 38 36 40  ALKPHOS 74 81 62 60 58  BILITOT 0.6 0.7 1.3* 1.4* 1.1  PROT 6.2* 5.9* 5.0* 5.0* 5.0*  ALBUMIN 3.3* 2.9* 2.4* 2.2* 1.9*   No results for input(s): LIPASE, AMYLASE in the last 168 hours. No results for input(s): AMMONIA in the last 168 hours.  CBC: Recent Labs  Lab 10/20/2020 1759 10/13/20 1513 10/14/20 0345 10/14/20 0345 10/14/20 1550 10/14/20 1550 10/12/2020 0547 11/09/2020 1114 10/25/2020 1122 11/08/2020 1400 10/16/20 0143 10/16/20 0158 10/17/20 0308  WBC 6.1   < > 10.3  --  12.6*  --  9.8  --   --   --   --  10.6* 9.4  NEUTROABS 3.5  --  8.0*  --   --   --   --   --   --   --   --   --   --   HGB 9.8*   < > 10.6*   < > 9.9*   < > 8.5*   < > 7.5* 8.1* 7.5* 8.3* 7.7*  HCT 29.0*   < > 31.9*   < > 29.1*   < > 26.2*   < > 22.0* 25.1* 22.0* 25.9* 25.3*  MCV 91.2   < > 91.4  --  89.8  --  93.2  --   --   --   --  95.2 95.5  PLT 255   < > 319  --  301  --  255  --   --   --   --  211 192   < > = values in this interval not displayed.    Cardiac Enzymes: No results for input(s): CKTOTAL, CKMB, CKMBINDEX, TROPONINI in the last 168 hours.  BNP: BNP (last 3 results) Recent Labs    10/13/20 1513  BNP 589.0*    ProBNP (last 3 results) No results for input(s): PROBNP in the last 8760 hours.    Other results:  Imaging: DG Abd 1 View  Result  Date: 10/16/2020 CLINICAL DATA:  Orogastric tube placement. EXAM: ABDOMEN - 1 VIEW COMPARISON:  October 13, 2020. FINDINGS: The bowel gas pattern is normal. Distal tip of nasogastric tube is seen in expected position of the second portion of the duodenum. No radio-opaque calculi or other significant radiographic abnormality are seen. IMPRESSION: Distal tip of nasogastric tube seen in expected position of the second portion of the duodenum. Electronically Signed   By: Marijo Conception M.D.   On: 10/16/2020 08:04   CARDIAC CATHETERIZATION  Result Date: 10/16/2020 Right heart catheterization 10/26/2020: RA 8/6, mean 6 mmHg, RA saturation 48%. RV 24/6, EDP 8 mmHg. PA 22/11, mean 17 mmHg.  PA saturation 53%. PW 14/13, mean 9 mmHg.  Aortic saturation was 99%. Cardiac output by Fick was 4.09 and by thermodilution was 3.51 with cardiac index of 2.26 and 1.94 respectively. SVR 1467 dynes per second, PVR 156 dynes per second.  QP/QS 1.00. Impression: Normal right heart pressure.  Decreased cardiac index, consistent with cardiomyopathy.  However patient does not appear to be in pulmonary edema from cardiogenic cause. I will repeat COVID -19 test as well just as a precaution. Will discuss with PCCM.  DG CHEST PORT 1 VIEW  Result Date: 10/17/2020 CLINICAL DATA:  Hypoxia EXAM: PORTABLE CHEST 1 VIEW COMPARISON:  10/16/2020 and prior. FINDINGS: Increased conspicuity of bilateral pulmonary opacities. No pneumothorax or pleural effusion. Stable cardiomediastinal silhouette. Right IJ approach Swan-Ganz catheter tip overlying the right main pulmonary  artery, unchanged. Stable ETT. Partially imaged enteric tube. IMPRESSION: Increased conspicuity of bilateral pulmonary opacities. Stable support lines and tubes. Electronically Signed   By: Primitivo Gauze M.D.   On: 10/17/2020 08:07   DG Chest Port 1 View  Result Date: 10/16/2020 CLINICAL DATA:  Mucous plugging EXAM: PORTABLE CHEST 1 VIEW COMPARISON:  October 15, 2020  FINDINGS: The heart size and mediastinal contours are within normal limits. Again noted are multifocal bilateral patchy airspace opacities not significantly changed since the prior exam. There is interval placement of a right PA catheter with the tip just entering the right main pulmonary artery. ETT is seen 3 cm above the carina. NG tube is seen below the diaphragm. IMPRESSION: Lines and tubes in satisfactory position. Unchanged multifocal airspace opacities Electronically Signed   By: Prudencio Pair M.D.   On: 10/16/2020 02:06     Medications:     Scheduled Medications: . aspirin  81 mg Per Tube Daily  . bisacodyl  10 mg Rectal Daily  . chlorhexidine gluconate (MEDLINE KIT)  15 mL Mouth Rinse BID  . Chlorhexidine Gluconate Cloth  6 each Topical Daily  . diazepam  2.5 mg Per Tube Q6H  . ezetimibe  10 mg Per Tube Daily  . feeding supplement (PROSource TF)  90 mL Per Tube BID  . heparin injection (subcutaneous)  5,000 Units Subcutaneous Q8H  . insulin aspart  0-15 Units Subcutaneous Q4H  . insulin aspart  3 Units Subcutaneous Q4H  . insulin detemir  15 Units Subcutaneous BID  . ipratropium-albuterol  3 mL Nebulization Q6H  . lactulose  30 g Per Tube BID  . mouth rinse  15 mL Mouth Rinse 10 times per day  . neostigmine  0.25 mg Subcutaneous Q6H  . pantoprazole sodium  40 mg Per Tube Q1200  . polyethylene glycol  17 g Per Tube Daily  . pregabalin  75 mg Per Tube BID  . rosuvastatin  20 mg Per Tube Daily  . sodium chloride flush  10-40 mL Intracatheter Q12H  . sodium chloride flush  3 mL Intravenous Q12H    Infusions: . sodium chloride    . sodium chloride    . ceFEPime (MAXIPIME) IV 2 g (10/17/20 1005)  . dexmedetomidine (PRECEDEX) IV infusion 0.3 mcg/kg/hr (10/17/20 0900)  . doxycycline (VIBRAMYCIN) IV Stopped (10/16/20 2339)  . feeding supplement (VITAL 1.5 CAL) Stopped (10/17/20 0800)  . fentaNYL infusion INTRAVENOUS 50 mcg/hr (10/17/20 0900)  . norepinephrine (LEVOPHED) Adult  infusion Stopped (10/16/20 0146)  . potassium PHOSPHATE IVPB (in mmol) 86 mL/hr at 10/17/20 0900  . vasopressin Stopped (10/16/20 0836)    PRN Medications: Place/Maintain arterial line **AND** sodium chloride, sodium chloride, acetaminophen (TYLENOL) oral liquid 160 mg/5 mL, midazolam, ondansetron **OR** ondansetron (ZOFRAN) IV, sodium chloride flush, sodium chloride flush   Assessment/Plan:   1. Acute Hypoxemic Respiratory Failure - due to PNA and HF - Developed acute respiratory failure on 12/4 and required intubation.  - CXR with pulmonary edema + infiltrates on 12/4.  - PCT 109 -> 84  - Developed mucous plugging 12/6 with severe respi distress and hypotension. Resolved with deep suctioning. Bronch with diffuse secretions. Cx pending - CXR with persistent bilateral infiltrates R>L worse on R oday. Personally reviewed - CCM following. - Continue abx.   2. Shock - likely sepsis/cardiogenic combined. Suspect sepsis/PBA predominates at this time - Lactic Acid 3.6>4.6 >2.8 > 1.8  - Echo- reduced EF with EF 25-30% and WMA. RV normal.  - Off inotropes/pressors currently  co-ox 69%. Hemodynamic looks good. Will keep swan in today if planning to extubate so we can triage more closely if not tolerating extubation  - On antimicrobial coverage for HCAP - Blood CX- NGTD  Sputum Cx- no growth.   3. Acute Systolic Heart Failure  - As above Echo with EF 25-30% . No previous ECHO. No previous cath.  - Hs Trop [phone]. - RHC 12/6 with low filling pressures after diuresis and low index 1.9. SVR 1467 - Unable to add GDMT due to shock.  - Suspect combination of septic and cardiogenic shock. (septic currently predominates) Troponin moderately elevated but flat. Possibility of Tako-tsubo/septic CM has been raised and seems to fit clinically however ECG also concerning for anterior MI. Will need eventual coronary angiography once recovered - Off inotropes/pressors currently co-ox 69%. Hemodynamic  looks good. Will keep swan in today if planning to extubate so we can triage more closely if not tolerating extubation   4. Back Pain--> S/P L5-S1 laminectomy with fusion 09/28/2020  Per Neurosugery  5. AKI Creatinine trending down  1.3>1.4>1.8>1.7>1.5 > 1.5 > 1,2  6. Hypokalemia - supp   CRITICAL CARE Performed by: Glori Bickers  Total critical care time: 45 minutes  Critical care time was exclusive of separately billable procedures and treating other patients.  Critical care was necessary to treat or prevent imminent or life-threatening deterioration.  Critical care was time spent personally by me (independent of midlevel providers or residents) on the following activities: development of treatment plan with patient and/or surrogate as well as nursing, discussions with consultants, evaluation of patient's response to treatment, examination of patient, obtaining history from patient or surrogate, ordering and performing treatments and interventions, ordering and review of laboratory studies, ordering and review of radiographic studies, pulse oximetry and re-evaluation of patient's condition.   Length of Stay: 7   Glori Bickers MD 10/17/2020, 10:30 AM  Advanced Heart Failure Team Pager [phone] (M-F; [place])  Please contact [facility] for night-coverage after hours (4p -7a ) and weekends on [email or web address]

## 2020-10-17 NOTE — Progress Notes (Signed)
PT w/ tachypnea s/p extubation, RR 40's.  Dr Tempie Hoist & then Dr Tamala Julian at bedside, per Dr Tamala Julian, okay to use bipap.  Pt placed on bipap, sat now 99%

## 2020-10-18 ENCOUNTER — Inpatient Hospital Stay (HOSPITAL_COMMUNITY): Payer: Medicare Other

## 2020-10-18 DIAGNOSIS — T17500A Unspecified foreign body in bronchus causing asphyxiation, initial encounter: Secondary | ICD-10-CM

## 2020-10-18 LAB — BASIC METABOLIC PANEL
Anion gap: 10 (ref 5–15)
BUN: 30 mg/dL — ABNORMAL HIGH (ref 8–23)
CO2: 21 mmol/L — ABNORMAL LOW (ref 22–32)
Calcium: 7.4 mg/dL — ABNORMAL LOW (ref 8.9–10.3)
Chloride: 110 mmol/L (ref 98–111)
Creatinine, Ser: 1.23 mg/dL (ref 0.61–1.24)
GFR, Estimated: 58 mL/min — ABNORMAL LOW (ref >60)
Glucose, Bld: 195 mg/dL — ABNORMAL HIGH (ref 70–99)
Potassium: 4.2 mmol/L (ref 3.5–5.1)
Sodium: 141 mmol/L (ref 135–145)

## 2020-10-18 LAB — CULTURE, RESPIRATORY W GRAM STAIN: Culture: NORMAL

## 2020-10-18 LAB — CULTURE, BLOOD (ROUTINE X 2)
Culture: NO GROWTH
Culture: NO GROWTH
Special Requests: ADEQUATE
Special Requests: ADEQUATE

## 2020-10-18 LAB — CBC
HCT: 25.9 % — ABNORMAL LOW (ref 39.0–52.0)
Hemoglobin: 8 g/dL — ABNORMAL LOW (ref 13.0–17.0)
MCH: 29.4 pg (ref 26.0–34.0)
MCHC: 30.9 g/dL (ref 30.0–36.0)
MCV: 95.2 fL (ref 80.0–100.0)
Platelets: 207 10*3/uL (ref 150–400)
RBC: 2.72 MIL/uL — ABNORMAL LOW (ref 4.22–5.81)
RDW: 12.9 % (ref 11.5–15.5)
WBC: 9.9 10*3/uL (ref 4.0–10.5)
nRBC: 0.9 % — ABNORMAL HIGH (ref 0.0–0.2)

## 2020-10-18 LAB — GLUCOSE, CAPILLARY
Glucose-Capillary: 106 mg/dL — ABNORMAL HIGH (ref 70–99)
Glucose-Capillary: 164 mg/dL — ABNORMAL HIGH (ref 70–99)
Glucose-Capillary: 183 mg/dL — ABNORMAL HIGH (ref 70–99)
Glucose-Capillary: 189 mg/dL — ABNORMAL HIGH (ref 70–99)
Glucose-Capillary: 99 mg/dL (ref 70–99)

## 2020-10-18 LAB — COOXEMETRY PANEL
Carboxyhemoglobin: 1 % (ref 0.5–1.5)
Methemoglobin: 0.6 % (ref 0.0–1.5)
O2 Saturation: 61.8 %
Total hemoglobin: 8.6 g/dL — ABNORMAL LOW (ref 12.0–16.0)

## 2020-10-18 LAB — PHOSPHORUS: Phosphorus: 2.5 mg/dL (ref 2.5–4.6)

## 2020-10-18 MED ORDER — POTASSIUM CHLORIDE 10 MEQ/50ML IV SOLN: 10.0000 meq | INTRAVENOUS | Status: DC

## 2020-10-18 MED ORDER — FUROSEMIDE 10 MG/ML IJ SOLN: 20.0000 mg | Freq: Once | INTRAMUSCULAR | Status: DC

## 2020-10-18 MED ORDER — DOCUSATE SODIUM 50 MG/5ML PO LIQD
100.0000 mg | Freq: Two times a day (BID) | ORAL | Status: DC
  Administered 2020-10-18 – 2020-10-19 (×3): 100 mg
  Filled 2020-10-18 (×3): qty 10

## 2020-10-18 MED ORDER — LACTULOSE 10 GM/15ML PO SOLN
60.0000 g | Freq: Two times a day (BID) | ORAL | Status: DC
  Administered 2020-10-18 – 2020-10-19 (×3): 60 g
  Filled 2020-10-18 (×5): qty 90

## 2020-10-18 MED ORDER — SENNOSIDES 8.8 MG/5ML PO SYRP
10.0000 mL | ORAL_SOLUTION | Freq: Two times a day (BID) | ORAL | Status: DC
  Administered 2020-10-18 – 2020-10-19 (×3): 10 mL via ORAL
  Filled 2020-10-18 (×3): qty 10

## 2020-10-18 MED ORDER — POTASSIUM CHLORIDE 10 MEQ/50ML IV SOLN
10.0000 meq | INTRAVENOUS | Status: AC
  Administered 2020-10-18 (×2): 10 meq via INTRAVENOUS
  Filled 2020-10-18 (×2): qty 50

## 2020-10-18 MED ORDER — FUROSEMIDE 10 MG/ML IJ SOLN
40.0000 mg | Freq: Once | INTRAMUSCULAR | Status: AC
  Administered 2020-10-18: 40 mg via INTRAVENOUS
  Filled 2020-10-18: qty 4

## 2020-10-18 NOTE — Progress Notes (Signed)
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 He had no operative complications and was discharged home. He returned to [facility] 10 days post-op due to fatigue, weakness, and poorly controlled back pain. He was also found to have sick contact and he was admit to neurosurgery service for pain control, IV hydration and possible dispo to CIR.   Past Medical History  CAD, CKD3, HTN, [place] [facility] Events   10/11/2020 Admisson  Consults:  Neurosurg  Procedures:  10/13/20 Intubation 12/5 bronch  Significant Diagnostic Tests:  10/11/20 Chest X-ray, minimal left basilar atelectasis 12/3 LE venous doppler: neg 12/4 echo: LVEF 25-30%  Micro Data:  10/14/2020 COVID, Flu neg 12/4:  MRSA PCR neg 12/4 Bronch tracheal aspirate - GNR, GPC growing 12/4 blood: ngtd  Antimicrobials:  Vanc>> Cefepime>> Doxycycline>> Interim history/subjective:  Reintubated yesterday. No events per nursing.  Objective   Blood pressure 134/73, pulse 74, temperature 100.22 F (37.9 C), resp. rate (!) 24, height _0  (1.702 m), weight 75.9 kg, SpO2 100 %. PAP: (29-59)/(12-32) 38/17 CVP:  [1 mmHg-18 mmHg] 6 mmHg CO:  [5 L/min-5.3 L/min] 5.3 L/min CI:  [2.7 L/min/m2-2.9 L/min/m2] 2.9 L/min/m2  Vent Mode: PRVC FiO2 (%):  [40 %-50 %] 40 % Set Rate:  [20 bmp] 20 bmp Vt Set:  [520 mL] 520 mL PEEP:  [5 cmH20] 5 cmH20 Pressure Support:  [5 cmH20-10 cmH20] 10 cmH20 Plateau Pressure:  [19 cmH20-21 cmH20] 21 cmH20   Intake/Output Summary (Last 24 hours) at 10/18/2020 0704 Last data filed at 10/18/2020 0500 Gross per 24 hour  Intake 2683.13 ml  Output 2100  ml  Net 583.13 ml   Filed Weights   10/29/2020 0500 10/16/20 0432 10/17/20 0500  Weight: 69.5 kg 70.4 kg 75.9 kg   Constitutional: elderly man sedated on vent  Eyes: pupils pointpoint but equal Ears, nose, mouth, and throat: ETT in place with small thick secretions Cardiovascular: RRR, ext warm Respiratory: Rhonci R>L, no accessory muscle use Gastrointestinal: remains distended with hypoactive BS Skin: No rashes, normal turgor Neurologic: moves all 4 ext spontaneously Psychiatric: RASS -1  SvO2 62% Net even Chemistry pending  Resolved Hospital Problem list     Assessment & Plan:   L5-S1 DJD s/p laminectomy/decompression Post-op day 16. Per neurosurgery, no operative complications. Difficulty with post-op deconditioning and pain control. - Per neurosurgery - Will need to find happy balance between pain control and pulmonary toileting once off vent  Acute hypoxic respiratory failure due to acute pulm edema and likely aspiration pneumonitis vs HCAP.  Extubated and reintubated 12/8 due to inability to handle secretions and abdominal discomfort. - Continue vent support, VAP prevention bundle - Diuresis as tolerated - Need to get constipation under better control and continued treatment of pneumonia prior to another extubation trial - Vanc/cefepime/doxycycline, f/u BAL culture data - Discussed that if we cannot bridge him off vent with supportive measures (diuresis, bowel regimen, abx), he may need a tracheostomy to help wean off vent  Constipation- continue strengthened bowel regimen  Takutsubo's- management per cardiology  AKI due to ATN- diurese as tolerated by renal function  DM2- A1c 7.7%, hyperglycemic still -  Levemir, SSI as ordered  Anemia- multifactorial, monitor for s/s of bleeding, daily checks  Deconditioning- biggest barrier to recovery, sedation and vent not helping  Best practice (evaluated daily)   Diet: TF Pain/Anxiety/Delirium protocol (if indicated):  Precedex, fentanyl VAP protocol (if indicated): ordered DVT prophylaxis: heparin GI prophylaxis: PPI Glucose control: see above Mobility: BR Code Status: Full Disposition: ICU  Wife updated at bedside.  Patient critically ill due to respiratory failure Interventions to address this today vent titration Risk of deterioration without these interventions is high  I personally spent 34 minutes providing critical care not including any separately billable procedures  Erskine Emery MD [facility] 10/18/2020 7:04 AM Personal pager: [phone] If unanswered, please page CCM On-call: [phone]

## 2020-10-18 NOTE — Progress Notes (Signed)
Subjective:  Intubated and sedated.  Back on vasopressin 0.1 mcg/Kg/min.  Received one dose lasix last night with excellent UOP.    Intake/Output from previous day:  I/O last 3 completed shifts: In: 4284.4 [I.V.:958; NG/GT:1420.5; IV Piggyback:1905.9] Out: 8588 [Urine:3025; Stool:150] Total I/O In: 69.1 [I.V.:24.1; NG/GT:45] Out: -   Blood pressure (!) 112/57, pulse 90, temperature (!) 100.76 F (38.2 C), resp. rate (!) 27, height _0  (1.702 m), weight 75.9 kg, SpO2 100 %.   Vitals with BMI 10/18/2020 10/18/2020 10/18/2020  Height - - -  Weight - - -  BMI - - -  Systolic 502 - 774  Diastolic 57 - 66  Pulse 90 76 74    Physical Exam Constitutional:      Interventions: He is sedated and intubated.  Cardiovascular:     Rate and Rhythm: Normal rate and regular rhythm.     Pulses: Intact distal pulses.     Heart sounds: Normal heart sounds. No murmur heard. No gallop.      Comments: No leg edema, no JVD. Pulmonary:     Effort: Pulmonary effort is normal. He is intubated.     Breath sounds: Rhonchi (bilateral diffuse) present.  Abdominal:     General: There is distension.     Palpations: Abdomen is soft.     Comments: Bowel sounds sluggish  Musculoskeletal:        General: No swelling or deformity.    Lab Results: BMP BNP (last 3 results) Recent Labs    10/13/20 1513  BNP 589.0*    ProBNP (last 3 results) No results for input(s): PROBNP in the last 8760 hours. BMP Latest Ref Rng & Units 10/18/2020 10/17/2020 10/17/2020  Glucose 70 - 99 mg/dL 195(H) - 311(H)  BUN 8 - 23 mg/dL 30(H) - 38(H)  Creatinine 0.61 - 1.24 mg/dL 1.23 - 1.22  Sodium 135 - 145 mmol/L 141 145 140  Potassium 3.5 - 5.1 mmol/L 4.2 3.7 3.3(L)  Chloride 98 - 111 mmol/L 110 - 108  CO2 22 - 32 mmol/L 21(L) - 24  Calcium 8.9 - 10.3 mg/dL 7.4(L) - 7.2(L)   Hepatic Function Latest Ref Rng & Units 10/19/2020 10/14/2020 10/14/2020  Total Protein 6.5 - 8.1 g/dL 5.0(L) 5.0(L) 5.0(L)  Albumin 3.5 - 5.0 g/dL  1.9(L) 2.2(L) 2.4(L)  AST 15 - 41 U/L 43(H) 42(H) 43(H)  ALT 0 - 44 U/L 40 36 38  Alk Phosphatase 38 - 126 U/L 58 60 62  Total Bilirubin 0.3 - 1.2 mg/dL 1.1 1.4(H) 1.3(H)   CBC Latest Ref Rng & Units 10/18/2020 10/17/2020 10/17/2020  WBC 4.0 - 10.5 K/uL 9.9 - 9.4  Hemoglobin 13.0 - 17.0 g/dL 8.0(L) 7.5(L) 7.7(L)  Hematocrit 39.0 - 52.0 % 25.9(L) 22.0(L) 25.3(L)  Platelets 150 - 400 K/uL 207 - 192   Lipid Panel  No results found for: CHOL, TRIG, HDL, CHOLHDL, VLDL, LDLCALC, LDLDIRECT Cardiac Panel (last 3 results) No results for input(s): CKTOTAL, CKMB, TROPONINI, RELINDX in the last 72 hours.  HEMOGLOBIN A1C Lab Results  Component Value Date   HGBA1C 7.7 (H) 10/12/2020   MPG 174.29 10/12/2020   TSH No results for input(s): TSH in the last 8760 hours. Imaging: Imaging results have been reviewed  DG Chest 1 View  10/13/2020 CLINICAL DATA:  Hypoxemia EXAM: CHEST  1 VIEW COMPARISON:  Radiograph 10/13/2020 FINDINGS: Endotracheal tube terminates in the mid trachea, 5.5 cm from the carina. Transesophageal tube tip and side port are distal to the GE junction, terminating  below the margins of imaging. Telemetry leads and external support devices overlie the chest. Redemonstration of the heterogeneous opacity in both lungs,, right greater than left with some slight interval clearing in the right mid lung, possibly related to improving lung volumes when compared to prior study. No visible pneumothorax or effusion. The aorta is calcified. The remaining cardiomediastinal contours are unremarkable. No acute osseous or soft tissue abnormality.  IMPRESSION: 1. Persistent bilateral, opacities, right greater than left. Slight interval clearing in the right mid lung, possibly related to improving lung volumes when compared to prior study. 2. Support devices as above. Electronically Signed   By: Lovena Le M.D.   On: 10/13/2020 23:00    DG Chest 1 View  10/23/2020  1. Stable support apparatus. 2.  Persistent bilateral lung infiltrates.   DG Chest 1 View  10/16/2020   Lines and tubes in satisfactory position.   Unchanged multifocal airspace opacities  CXR 10/18/20  1. Lines and tubes in stable position. Swan-Ganz catheter tip noted over the right pulmonary artery in unchanged position. 2. Persistent unchanged multifocal severe bilateral pulmonary infiltrates. Small bilateral pleural effusions.    Lower Extremity Venous Duplex  10/13/2020: Summary: RIGHT: - There is no evidence of deep vein thrombosis in the lower extremity.  - No cystic structure found in the popliteal fossa.  LEFT: - There is no evidence of deep vein thrombosis in the lower extremity.  - No cystic structure found in the popliteal fossa.  *See table(s) above for measurements and observations. Electronically signed by Harold Barban MD on 10/13/2020 at 5:35:09 PM. Cardiac Studies:   Echocardiogram 10/13/2020:  1. Left ventricular ejection fraction, by estimation, is 25 to 30%. The left ventricle has severely decreased function. The left ventricle demonstrates regional wall motion abnormalities (see scoring diagram/findings for description). Left ventricular  diastolic parameters are indeterminate. 2. Right ventricular systolic function is normal. The right ventricular size is normal. There is normal pulmonary artery systolic pressure. 3. The mitral valve is grossly normal. Trivial mitral valve regurgitation. No evidence of mitral stenosis. 4. The aortic valve is grossly normal. There is mild calcification of the aortic valve. Aortic valve regurgitation is not visualized. No aortic stenosis is present. 5. The inferior vena cava is normal in size with <50% respiratory variability, suggesting right atrial pressure of 8 mmHg.   EKG:  EKG 10/14/2020: Normal sinus rhythm at rate of 69 bpm, normal axis, poor R wave progression, cannot exclude anteroseptal infarct old.  T wave abnormality, anterolateral ischemia.   Compared to April 19, 2020, poor R wave progression and T wave abnormality new.  Compared to yesterday, tibial abnormality In the anterolateral leads is new.  Assessment    1.  Septic shock.  Back on low dose vasopressin at 0.56mg/KG/min. Wedge up dotay to 26 mm Hg.  2. Bilateral pneumonia with respiratory failure.  3. Abdominal distention and  bowel sounds present and have improved today. Getting tube feeds for now. Will get  X-Ray abdomen to look for bowel distention. Do not  Suspect perforation or any significant pathology. Suspect sepsis  related ilius.    Scheduled Meds: . aspirin  81 mg Per Tube Daily  . bisacodyl  10 mg Rectal Daily  . chlorhexidine gluconate (MEDLINE KIT)  15 mL Mouth Rinse BID  . Chlorhexidine Gluconate Cloth  6 each Topical Daily  . diazepam  2.5 mg Per Tube Q6H  . docusate  100 mg Per Tube BID  . ezetimibe  10 mg Per Tube  Daily  . feeding supplement (PROSource TF)  90 mL Per Tube BID  . furosemide  20 mg Intravenous Once  . heparin injection (subcutaneous)  5,000 Units Subcutaneous Q8H  . insulin aspart  0-15 Units Subcutaneous Q4H  . insulin aspart  3 Units Subcutaneous Q4H  . insulin detemir  15 Units Subcutaneous BID  . ipratropium-albuterol  3 mL Nebulization Q6H  . lactulose  30 g Per Tube BID  . mouth rinse  15 mL Mouth Rinse 10 times per day  . pantoprazole sodium  40 mg Per Tube Q1200  . polyethylene glycol  17 g Per Tube Daily  . pregabalin  75 mg Per Tube BID  . rosuvastatin  20 mg Per Tube Daily  . sennosides  10 mL Oral BID  . sodium chloride flush  10-40 mL Intracatheter Q12H  . sodium chloride flush  3 mL Intravenous Q12H   Continuous Infusions: . sodium chloride    . sodium chloride    . ceFEPime (MAXIPIME) IV Stopped (10/17/20 2220)  . dexmedetomidine (PRECEDEX) IV infusion 0.4 mcg/kg/hr (10/18/20 0800)  . doxycycline (VIBRAMYCIN) IV Stopped (10/18/20 0053)  . feeding supplement (VITAL 1.5 CAL) 45 mL/hr at 10/18/20 0800  . fentaNYL  infusion INTRAVENOUS 50 mcg/hr (10/18/20 0800)  . norepinephrine (LEVOPHED) Adult infusion Stopped (10/16/20 0146)  . vancomycin Stopped (10/18/20 0514)  . vasopressin 0.01 Units/min (10/18/20 0800)   PRN Meds:.Place/Maintain arterial line **AND** sodium chloride, sodium chloride, acetaminophen (TYLENOL) oral liquid 160 mg/5 mL, labetalol, midazolam, ondansetron **OR** ondansetron (ZOFRAN) IV, sodium chloride flush, sodium chloride flush  Plan:   Lasix 40 mg x 1 now. Will have heart failure team manage going forward until stable. Once off Vasopressin, will start optimization of heart failure meds.  X-Ray Abd for bowel distention.  Lactate, Coox normal. Anemia probably due to underlying acute illness.  EKG today to f/u on QTc. QT now appears normal at 42m Crit care time 25 min.   JAdrian Prows MD, FAspirus [facility] Hspt[facility] Assoc12/07/2020, 8:40 AM Office: [phone]: [phone]

## 2020-10-18 NOTE — Progress Notes (Signed)
Patient did not tolerate extubation trial yesterday secondary to copious secretions which was unable to clear.  Patient required reintubation.  He is required some circulatory's support with vasopressin after reintubation.  His urine output is stable.  His pulmonary artery pressures are increasing however.  Laboratory studies are stable.  White count stable.  Hematocrit stable.  No evidence of worsening renal failure.  Patient remains critically ill with pulmonary and cardiac failure.  Continue current supportive efforts.  Continue broad-spectrum antibiotics.

## 2020-10-18 NOTE — Progress Notes (Addendum)
Advanced Heart Failure Rounding Note   Subjective:   12/7 Bronch with copious secretions that were removed.  12/8 Extubated but required re intubation.   Cultures pending. T max 101  Currently on vasopressin 0.02 units. CO-OX 62%.   On vent. Sedation cut back this morning. Follows commands.    PAP: (29-59)/(12-32) 35/20 CVP:  [1 mmHg-18 mmHg] 9 mmHg CO:  [5 L/min-5.3 L/min] 5.3 L/min CI:  [2.7 L/min/m2-2.9 L/min/m2] 2.9 L/min/m2    Objective:   Weight Range:  Vital Signs:   Temp:  [98.2 F (36.8 C)-101.1 F (38.4 C)] 100.76 F (38.2 C) (12/09 0700) Pulse Rate:  [66-139] 76 (12/09 0721) Resp:  [18-40] 30 (12/09 0721) BP: (96-189)/(55-107) 119/66 (12/09 0700) SpO2:  [89 %-100 %] 99 % (12/09 0721) Arterial Line BP: (99-260)/(39-89) 141/54 (12/09 0700) FiO2 (%):  [40 %-50 %] 40 % (12/09 0721) Last BM Date: 10/17/20  Weight change: Filed Weights   10/23/2020 0500 10/16/20 0432 10/17/20 0500  Weight: 69.5 kg 70.4 kg 75.9 kg    Intake/Output:   Intake/Output Summary (Last 24 hours) at 10/18/2020 0803 Last data filed at 10/18/2020 0500 Gross per 24 hour  Intake 2527.37 ml  Output 2100 ml  Net 427.37 ml    CVP 8- 9 Physical Exam: General:  On vent  HEENT: ETT Neck: supple.JVP 8-9 . Carotids 2+ bilat; no bruits. No lymphadenopathy or thryomegaly appreciated. RIJ swan.  Cor: PMI nondisplaced. Regular rate & rhythm. No rubs, gallops or murmurs. Lungs: Rhonchi throughout.  Abdomen: soft, nontender, distended. No hepatosplenomegaly. No bruits or masses. Good bowel sounds. Has flexi seal.  Extremities: no cyanosis, clubbing, rash, edema Neuro: intubated. MAE x4. Follows commands.  GU: foley     Telemetry: SR   Labs: Basic Metabolic Panel: Recent Labs  Lab 10/14/20 0345 10/14/20 1550 10/24/2020 0547 11/09/2020 1114 10/17/2020 1122 10/16/20 0143 10/16/20 0158 10/17/20 0308 10/17/20 1548 10/17/20 2000 10/18/20 0404  NA 142 141 140   < > 142 142 141 140 145   --   --   K 4.2 3.4* 3.5   < > 3.5 3.5 4.1 3.3* 3.7  --   --   CL 105 102 104  --   --   --  107 108  --   --   --   CO2 _0 --   --   --  25 24  --   --   --   GLUCOSE 221* 213* 252*  --   --   --  329* 311*  --   --   --   BUN 46* 42* 47*  --   --   --  54* 38*  --   --   --   CREATININE 1.81* 1.72* 1.53*  --   --   --  1.51* 1.22  --   --   --   CALCIUM 7.0* 6.7* 7.4*  --   --   --  7.1* 7.2*  --   --   --   MG  --   --  1.9  --   --   --   --   --   --   --   --   PHOS  --   --  2.7  --   --   --  2.6 1.5*  --  3.5 2.5   < > = values in this interval not displayed.    Liver Function Tests: Recent Labs  Lab 10/13/20 1513 10/14/20 0345 10/14/20 1550 11/09/2020 0547  AST 56* 43* 42* 43*  ALT 43 38 36 40  ALKPHOS 81 62 60 58  BILITOT 0.7 1.3* 1.4* 1.1  PROT 5.9* 5.0* 5.0* 5.0*  ALBUMIN 2.9* 2.4* 2.2* 1.9*   No results for input(s): LIPASE, AMYLASE in the last 168 hours. No results for input(s): AMMONIA in the last 168 hours.  CBC: Recent Labs  Lab 10/14/20 0345 10/14/20 1550 11/09/2020 0547 10/20/2020 1114 10/16/20 0143 10/16/20 0158 10/17/20 0308 10/17/20 1548 10/18/20 0404  WBC 10.3 12.6* 9.8  --   --  10.6* 9.4  --  9.9  NEUTROABS 8.0*  --   --   --   --   --   --   --   --   HGB 10.6* 9.9* 8.5*   < > 7.5* 8.3* 7.7* 7.5* 8.0*  HCT 31.9* 29.1* 26.2*   < > 22.0* 25.9* 25.3* 22.0* 25.9*  MCV 91.4 89.8 93.2  --   --  95.2 95.5  --  95.2  PLT 319 301 255  --   --  211 192  --  207   < > = values in this interval not displayed.    Cardiac Enzymes: No results for input(s): CKTOTAL, CKMB, CKMBINDEX, TROPONINI in the last 168 hours.  BNP: BNP (last 3 results) Recent Labs    10/13/20 1513  BNP 589.0*    ProBNP (last 3 results) No results for input(s): PROBNP in the last 8760 hours.    Other results:  Imaging: DG CHEST PORT 1 VIEW  Result Date: 10/18/2020 CLINICAL DATA:  Intubation.  Respiratory failure.  Pneumonia. EXAM: PORTABLE CHEST 1 VIEW  COMPARISON:  Chest x-ray 10/17/2020. FINDINGS: Endotracheal tube, NG tube in stable position. Swan-Ganz catheter tip noted over the right pulmonary artery in unchanged position. Heart size stable. Persistent unchanged multifocal severe bilateral pulmonary infiltrates. Small bilateral pleural effusions. No pneumothorax. IMPRESSION: 1. Lines and tubes in stable position. Swan-Ganz catheter tip noted over the right pulmonary artery in unchanged position. 2. Persistent unchanged multifocal severe bilateral pulmonary infiltrates. Small bilateral pleural effusions. Electronically Signed   By: Marcello Moores  Register   On: 10/18/2020 06:48   DG CHEST PORT 1 VIEW  Result Date: 10/17/2020 CLINICAL DATA:  Endotracheal tube. EXAM: PORTABLE CHEST 1 VIEW COMPARISON:  Same day. FINDINGS: Endotracheal and nasogastric tubes are unchanged in position. Swan-Ganz catheter is unchanged. Stable bilateral lung opacities are noted concerning for multifocal pneumonia. No pneumothorax is noted. Small pleural effusions cannot be excluded. Bony thorax is unremarkable. IMPRESSION: Stable support apparatus. Stable bilateral lung opacities concerning for multifocal pneumonia. Electronically Signed   By: Marijo Conception M.D.   On: 10/17/2020 13:59   DG CHEST PORT 1 VIEW  Result Date: 10/17/2020 CLINICAL DATA:  Hypoxia EXAM: PORTABLE CHEST 1 VIEW COMPARISON:  10/16/2020 and prior. FINDINGS: Increased conspicuity of bilateral pulmonary opacities. No pneumothorax or pleural effusion. Stable cardiomediastinal silhouette. Right IJ approach Swan-Ganz catheter tip overlying the right main pulmonary artery, unchanged. Stable ETT. Partially imaged enteric tube. IMPRESSION: Increased conspicuity of bilateral pulmonary opacities. Stable support lines and tubes. Electronically Signed   By: Primitivo Gauze M.D.   On: 10/17/2020 08:07     Medications:     Scheduled Medications: . aspirin  81 mg Per Tube Daily  . bisacodyl  10 mg Rectal Daily  .  chlorhexidine gluconate (MEDLINE KIT)  15 mL Mouth Rinse BID  . Chlorhexidine Gluconate Cloth  6 each Topical  Daily  . diazepam  2.5 mg Per Tube Q6H  . ezetimibe  10 mg Per Tube Daily  . feeding supplement (PROSource TF)  90 mL Per Tube BID  . heparin injection (subcutaneous)  5,000 Units Subcutaneous Q8H  . insulin aspart  0-15 Units Subcutaneous Q4H  . insulin aspart  3 Units Subcutaneous Q4H  . insulin detemir  15 Units Subcutaneous BID  . ipratropium-albuterol  3 mL Nebulization Q6H  . lactulose  30 g Per Tube BID  . mouth rinse  15 mL Mouth Rinse 10 times per day  . pantoprazole sodium  40 mg Per Tube Q1200  . polyethylene glycol  17 g Per Tube Daily  . pregabalin  75 mg Per Tube BID  . rosuvastatin  20 mg Per Tube Daily  . sodium chloride flush  10-40 mL Intracatheter Q12H  . sodium chloride flush  3 mL Intravenous Q12H    Infusions: . sodium chloride    . sodium chloride    . ceFEPime (MAXIPIME) IV Stopped (10/17/20 2220)  . dexmedetomidine (PRECEDEX) IV infusion 0.9 mcg/kg/hr (10/18/20 0500)  . doxycycline (VIBRAMYCIN) IV Stopped (10/18/20 0053)  . feeding supplement (VITAL 1.5 CAL) 1,000 mL (10/18/20 0655)  . fentaNYL infusion INTRAVENOUS 100 mcg/hr (10/18/20 0500)  . norepinephrine (LEVOPHED) Adult infusion Stopped (10/16/20 0146)  . vancomycin 150 mL/hr at 10/18/20 0500  . vasopressin 0.02 Units/min (10/18/20 0500)    PRN Medications: Place/Maintain arterial line **AND** sodium chloride, sodium chloride, acetaminophen (TYLENOL) oral liquid 160 mg/5 mL, labetalol, midazolam, ondansetron **OR** ondansetron (ZOFRAN) IV, sodium chloride flush, sodium chloride flush   Assessment/Plan:   1. Acute Hypoxemic Respiratory Failure - due to PNA and HF - Developed acute respiratory failure on 12/4 and required intubation.  - CXR with pulmonary edema + infiltrates on 12/4.  - PCT 109 -> 84  - Developed mucous plugging 12/6 with severe respi distress and hypotension. Resolved  with deep suctioning. Bronch with diffuse secretions.  - Extubated 12/8 but required re intubation.   2. Shock - likely sepsis/cardiogenic combined. Suspect sepsis/PBA predominates at this time - Lactic Acid 3.6>4.6 >2.8 > 1.8 > none today.  - Echo- reduced EF with EF 25-30% and WMA. RV normal.  - Off inotropes/pressors currently co-ox 69%. Hemodynamic looks good. Will keep swan in today if planning to extubate so we can triage more closely if not tolerating extubation  - On antimicrobial coverage for HCAP- expanded 12/8.  - Blood CX- NGTD   - Sputum Cx- no growth.   3. Acute Systolic Heart Failure  - As above Echo with EF 25-30% . No previous ECHO. No previous cath.  - Hs Trop [id number]. - RHC 12/6 with low filling pressures after diuresis and low index 1.9. SVR 1467 - Unable to add GDMT due to shock.  - BMET pending. CO-OX 62% - CVP 8. Weight up 12 pounds over the last 24 hours. Give 20 mg IV lasix.  - Suspect combination of septic and cardiogenic shock. (septic currently predominates) Troponin moderately elevated but flat. Possibility of Tako-tsubo/septic CM has been raised and seems to fit clinically however ECG also concerning for anterior MI. Will need eventual coronary angiography once recovered - Off inotropes/pressors currently co-ox 62%.  4. Back Pain--> S/P L5-S1 laminectomy with fusion 09/28/2020  Per Neuro[facility]  5. AKI Creatinine trending down  1.3>1.4>1.8>1.7>1.5 > 1.5 > 1.2>pending.   6. Hypokalemia - supp  Length of Stay: [place] NP-C  10/18/2020, 8:03 AM  Advanced  Heart Failure Team Pager [phone] (M-F; [place])  Please contact [facility] for night-coverage after hours (4p -7a ) and weekends on [email or web address]  Agree with above.  He remains intubated. Critically ill. Failed extubation yesterday due to PNA with copious/thick secretions. Cardiac output stable on swan but filling pressures increased.   General:  Elderly male on vent HEENT:  normal +ETT Neck: supple. RIJ swan Carotids 2+ bilat; no bruits. No lymphadenopathy or thryomegaly appreciated. Cor: PMI nondisplaced. Regular rate & rhythm. No rubs, gallops or murmurs. Lungs: + rhonchi Abdomen: soft, nontender, nondistended. No hepatosplenomegaly. No bruits or masses. Good bowel sounds. Extremities: no cyanosis, clubbing, rash, tr eddema + boots Neuro: sedated on vent  Main issue is severe PNA R>L with copious secretions. CO stable but volume up. Will give one dose lasix today. Would not push GDMT yet. Likely can pull swan tomorrow and place PICC.  D/w wife at bedside.   CRITICAL CARE Performed by: Glori Bickers  Total critical care time: 35 minutes  Critical care time was exclusive of separately billable procedures and treating other patients.  Critical care was necessary to treat or prevent imminent or life-threatening deterioration.  Critical care was time spent personally by me (independent of midlevel providers or residents) on the following activities: development of treatment plan with patient and/or surrogate as well as nursing, discussions with consultants, evaluation of patient's response to treatment, examination of patient, obtaining history from patient or surrogate, ordering and performing treatments and interventions, ordering and review of laboratory studies, ordering and review of radiographic studies, pulse oximetry and re-evaluation of patient's condition.  Glori Bickers, MD  6:29 PM

## 2020-10-19 ENCOUNTER — Inpatient Hospital Stay (HOSPITAL_COMMUNITY): Payer: Medicare Other

## 2020-10-19 ENCOUNTER — Inpatient Hospital Stay (HOSPITAL_BASED_OUTPATIENT_CLINIC_OR_DEPARTMENT_OTHER): Payer: Medicare Other

## 2020-10-19 ENCOUNTER — Inpatient Hospital Stay: Payer: Self-pay

## 2020-10-19 DIAGNOSIS — K567 Ileus, unspecified: Secondary | ICD-10-CM

## 2020-10-19 DIAGNOSIS — I5181 Takotsubo syndrome: Secondary | ICD-10-CM

## 2020-10-19 DIAGNOSIS — M7989 Other specified soft tissue disorders: Secondary | ICD-10-CM

## 2020-10-19 DIAGNOSIS — J80 Acute respiratory distress syndrome: Secondary | ICD-10-CM

## 2020-10-19 LAB — GLUCOSE, CAPILLARY
Glucose-Capillary: 203 mg/dL — ABNORMAL HIGH (ref 70–99)
Glucose-Capillary: 122 mg/dL — ABNORMAL HIGH (ref 70–99)
Glucose-Capillary: 252 mg/dL — ABNORMAL HIGH (ref 70–99)
Glucose-Capillary: 194 mg/dL — ABNORMAL HIGH (ref 70–99)
Glucose-Capillary: 119 mg/dL — ABNORMAL HIGH (ref 70–99)

## 2020-10-19 LAB — COOXEMETRY PANEL
Carboxyhemoglobin: 1.2 % (ref 0.5–1.5)
Methemoglobin: 0.7 % (ref 0.0–1.5)
O2 Saturation: 52.4 %
Total hemoglobin: 12 g/dL (ref 12.0–16.0)

## 2020-10-19 LAB — CBC
HCT: 25.4 % — ABNORMAL LOW (ref 39.0–52.0)
Hemoglobin: 8.1 g/dL — ABNORMAL LOW (ref 13.0–17.0)
MCH: 30 pg (ref 26.0–34.0)
MCHC: 31.9 g/dL (ref 30.0–36.0)
MCV: 94.1 fL (ref 80.0–100.0)
Platelets: 223 10*3/uL (ref 150–400)
RBC: 2.7 MIL/uL — ABNORMAL LOW (ref 4.22–5.81)
RDW: 13 % (ref 11.5–15.5)
WBC: 12.4 10*3/uL — ABNORMAL HIGH (ref 4.0–10.5)
nRBC: 0.4 % — ABNORMAL HIGH (ref 0.0–0.2)

## 2020-10-19 LAB — BASIC METABOLIC PANEL
Anion gap: 11 (ref 5–15)
BUN: 32 mg/dL — ABNORMAL HIGH (ref 8–23)
CO2: 22 mmol/L (ref 22–32)
Calcium: 7.5 mg/dL — ABNORMAL LOW (ref 8.9–10.3)
Chloride: 108 mmol/L (ref 98–111)
Creatinine, Ser: 1.4 mg/dL — ABNORMAL HIGH (ref 0.61–1.24)
GFR, Estimated: 50 mL/min — ABNORMAL LOW (ref >60)
Glucose, Bld: 134 mg/dL — ABNORMAL HIGH (ref 70–99)
Potassium: 3.9 mmol/L (ref 3.5–5.1)
Sodium: 141 mmol/L (ref 135–145)

## 2020-10-19 LAB — PHOSPHORUS: Phosphorus: 3.8 mg/dL (ref 2.5–4.6)

## 2020-10-19 LAB — C DIFFICILE (CDIFF) QUICK SCRN (NO PCR REFLEX)
C Diff antigen: NEGATIVE
C Diff interpretation: NOT DETECTED
C Diff toxin: NEGATIVE

## 2020-10-19 LAB — MAGNESIUM: Magnesium: 2 mg/dL (ref 1.7–2.4)

## 2020-10-19 MED ORDER — SODIUM CHLORIDE 0.9 % IV SOLN
2.0000 g | Freq: Two times a day (BID) | INTRAVENOUS | Status: DC
  Administered 2020-10-19: 2 g via INTRAVENOUS
  Filled 2020-10-19: qty 2

## 2020-10-19 MED ORDER — FENTANYL BOLUS VIA INFUSION
25.0000 ug | INTRAVENOUS | Status: DC | PRN
  Filled 2020-10-19: qty 25

## 2020-10-19 MED ORDER — SODIUM CHLORIDE 0.9% FLUSH: 10.0000 mL | INTRAVENOUS | Status: DC | PRN

## 2020-10-19 MED ORDER — SODIUM CHLORIDE 0.9 % IV SOLN
2.0000 g | Freq: Two times a day (BID) | INTRAVENOUS | Status: DC
  Administered 2020-10-19 – 2020-10-20 (×2): 2 g via INTRAVENOUS
  Filled 2020-10-19 (×3): qty 2

## 2020-10-19 MED ORDER — SODIUM CHLORIDE 0.9% FLUSH
10.0000 mL | Freq: Two times a day (BID) | INTRAVENOUS | Status: DC
  Administered 2020-10-19 – 2020-10-25 (×13): 10 mL
  Administered 2020-10-26: 21:00:00 30 mL
  Administered 2020-10-26 – 2020-10-30 (×9): 10 mL

## 2020-10-19 MED ORDER — SODIUM CHLORIDE 3 % IN NEBU
4.0000 mL | INHALATION_SOLUTION | Freq: Two times a day (BID) | RESPIRATORY_TRACT | Status: AC
  Administered 2020-10-19 – 2020-10-21 (×6): 4 mL via RESPIRATORY_TRACT
  Filled 2020-10-19 (×6): qty 4

## 2020-10-19 MED ORDER — VASOPRESSIN 20 UNITS/100 ML INFUSION FOR SHOCK
0.0000 [IU]/min | INTRAVENOUS | Status: DC
  Administered 2020-10-19: 0.02 [IU]/min via INTRAVENOUS
  Filled 2020-10-19: qty 100

## 2020-10-19 MED ORDER — FUROSEMIDE 10 MG/ML IJ SOLN
40.0000 mg | Freq: Once | INTRAMUSCULAR | Status: AC
  Administered 2020-10-19: 40 mg via INTRAVENOUS
  Filled 2020-10-19: qty 4

## 2020-10-19 MED ORDER — POLYETHYLENE GLYCOL 3350 17 G PO PACK: 17.0000 g | PACK | Freq: Every day | ORAL | Status: DC

## 2020-10-19 MED ORDER — METOCLOPRAMIDE HCL 5 MG/ML IJ SOLN
5.0000 mg | Freq: Three times a day (TID) | INTRAMUSCULAR | Status: DC
  Administered 2020-10-19 – 2020-10-31 (×36): 5 mg via INTRAVENOUS
  Filled 2020-10-19 (×35): qty 2

## 2020-10-19 MED ORDER — LABETALOL HCL 5 MG/ML IV SOLN
10.0000 mg | INTRAVENOUS | Status: DC | PRN
  Administered 2020-10-19: 10 mg via INTRAVENOUS
  Filled 2020-10-19: qty 4

## 2020-10-19 MED ORDER — FENTANYL 2500MCG IN NS 250ML (10MCG/ML) PREMIX INFUSION
25.0000 ug/h | INTRAVENOUS | Status: DC
  Administered 2020-10-19: 260 ug/h via INTRAVENOUS
  Administered 2020-10-19: 400 ug/h via INTRAVENOUS
  Administered 2020-10-20: 07:00:00 75 ug/h via INTRAVENOUS
  Administered 2020-10-21: 05:00:00 100 ug/h via INTRAVENOUS
  Administered 2020-10-21: 23:00:00 125 ug/h via INTRAVENOUS
  Filled 2020-10-19 (×4): qty 250

## 2020-10-19 MED ORDER — PROPOFOL 1000 MG/100ML IV EMUL
0.0000 ug/kg/min | INTRAVENOUS | Status: DC
  Administered 2020-10-19: 25 ug/kg/min via INTRAVENOUS
  Administered 2020-10-19: 40 ug/kg/min via INTRAVENOUS
  Administered 2020-10-19: 15 ug/kg/min via INTRAVENOUS
  Administered 2020-10-19: 25 ug/kg/min via INTRAVENOUS
  Administered 2020-10-20: 15:00:00 23.057 ug/kg/min via INTRAVENOUS
  Administered 2020-10-21: 02:00:00 15 ug/kg/min via INTRAVENOUS
  Administered 2020-10-22: 06:00:00 10 ug/kg/min via INTRAVENOUS
  Filled 2020-10-19 (×8): qty 100

## 2020-10-19 MED ORDER — VITAL 1.5 CAL PO LIQD
1000.0000 mL | ORAL | Status: DC
  Administered 2020-10-19: 1000 mL

## 2020-10-19 MED ORDER — FENTANYL CITRATE (PF) 100 MCG/2ML IJ SOLN: 25.0000 ug | Freq: Once | INTRAMUSCULAR | Status: DC

## 2020-10-19 MED ORDER — DOCUSATE SODIUM 50 MG/5ML PO LIQD
100.0000 mg | Freq: Two times a day (BID) | ORAL | Status: DC
Start: 2020-10-19 — End: 2020-10-19

## 2020-10-19 MED ORDER — SENNOSIDES 8.8 MG/5ML PO SYRP: 10.0000 mL | ORAL_SOLUTION | Freq: Two times a day (BID) | ORAL | Status: DC

## 2020-10-19 NOTE — Progress Notes (Signed)
Lower extremity venous has been completed.   Preliminary results in CV Proc.   Abram Sander 10/19/2020 11:24 AM

## 2020-10-19 NOTE — Progress Notes (Signed)
Pharmacy Antibiotic Note  Dennis Carey is a 85 y.o. male admitted on 10/26/2020 with back s/p L5-S1 decompression and fusion 10 days prior. Pharmacy has been consulted for meropenem dosing for pneumonia.   Scr 1.4 today (CrCl 37 mL/min). WBC 12.4, temp 102.5 - concern for adynamic ileus. Given continued fevers, will change from cefepime to meropenem.   Plan: - Vancomycin 750 mg IV q 12 hrs - Meropenem 2g IV q 12 hrs  - Will continue to follow renal function, culture results, LOT, and antibiotic de-escalation plans   Height: _0  (170.2 cm) Weight: 75.9 kg (167 lb 5.3 oz) IBW/kg (Calculated) : 66.1  Temp (24hrs), Avg:100.5 F (38.1 C), Min:98.4 F (36.9 C), Max:103.28 F (39.6 C)  Recent Labs  Lab 10/14/20 1050 10/14/20 1550 11/07/2020 0547 10/16/20 0158 10/16/20 0947 10/17/20 0308 10/17/20 0427 10/18/20 0404 10/18/20 0734 10/19/20 0324  WBC  --  12.6* 9.8 10.6*  --  9.4  --  9.9  --  12.4*  CREATININE  --  1.72* 1.53* 1.51*  --  1.22  --   --  1.23 1.40*  LATICACIDVEN 4.6* 2.8* 2.6*  --  3.4*  --  1.8  --   --   --     Estimated Creatinine Clearance: 37.4 mL/min (A) (by C-G formula based on SCr of 1.4 mg/dL (H)).    Allergies  Allergen Reactions  . Sulfa Antibiotics Hives    Antimicrobials this admission: Vancomycin 12/4 x1, 12/8>>  Cefepime 12/4 >> 12/10 Doxy 12/6>>12/9 Meropenem 12/10>>  Microbiology results: 12/4 MRSA PCR - neg 12/4 BCx - ngtd 12/4 TA -  mod GPC+ GNR, abund WBC (reicnubated)>nf 12/5 TA - few nf 12/7 TA (BAL) - few normal flora    Thank you for allowing pharmacy to be a part of this patient's care.  Antonietta Jewel, PharmD, [facility] Clinical Pharmacist  Phone: [phone] 10/19/2020 6:12 PM  Please check AMION for all [facility] phone numbers After 10:00 PM, call [facility] [phone]

## 2020-10-19 NOTE — Progress Notes (Addendum)
p    Advanced Heart Failure Rounding Note   Subjective:   12/7 Bronch with copious secretions that were removed.  12/8 Extubated but required re intubation.   Continues on Vent w/ heavy secretions. FiO2 40%. PEEP 5   Sputum cultures w/ normal respiratory flora. No staph aureus or pseudomonas growing.  Tmax last night 102. AF this AM. WBC 9.9>>12.4. Continues on cefepime, doxy and vanc.   Off NE. Currently on vasopressin 0.01 units. CO-OX 52%.  CO 4.37 CI 2.40 PAP 63/21 CVP 13    Objective:   Weight Range:  Vital Signs:   Temp:  [98.4 F (36.9 C)-102.6 F (39.2 C)] 99.5 F (37.5 C) (12/10 0645) Pulse Rate:  [72-107] 83 (12/10 0645) Resp:  [21-46] 25 (12/10 0645) BP: (88-141)/(48-79) 103/56 (12/10 0600) SpO2:  [90 %-100 %] 94 % (12/10 0645) Arterial Line BP: (94-181)/(31-63) 126/53 (12/10 0645) FiO2 (%):  [40 %] 40 % (12/10 0411) Last BM Date: 10/18/20  Weight change: Filed Weights   10/14/2020 0500 10/16/20 0432 10/17/20 0500  Weight: 69.5 kg 70.4 kg 75.9 kg    Intake/Output:   Intake/Output Summary (Last 24 hours) at 10/19/2020 0750 Last data filed at 10/19/2020 0600 Gross per 24 hour  Intake 3466.73 ml  Output 5100 ml  Net -1633.27 ml     Physical Exam: CVP 13  General:  Elderly male, diaphoretic, intubated and awake on vent, looks uncomfortable  HEENT: + ETT Neck: supple. CVP 10 cm . + Rt IJ swan Carotids 2+ bilat; no bruits. No lymphadenopathy or thryomegaly appreciated. RIJ swan.  Cor: PMI nondisplaced. Regular rhythm, tachy rate. No rubs, gallops or murmurs. Lungs: intubated, course Rhonchi bilaterally  Abdomen: soft, nontender, distended. No hepatosplenomegaly. No bruits or masses. Good bowel sounds. Has flexi seal.  Extremities: no cyanosis, clubbing, rash, edema Neuro: intubated and awake on vent, moving extremities GU: + foley, urine clear      Telemetry: Sinus tach low 100s-110s  Labs: Basic Metabolic Panel: Recent Labs  Lab  10/17/2020 0547 11/06/2020 1114 10/16/20 0158 10/17/20 0308 10/17/20 1548 10/17/20 2000 10/18/20 0404 10/18/20 0734 10/19/20 0324  NA 140   < > 141 140 145  --   --  141 141  K 3.5   < > 4.1 3.3* 3.7  --   --  4.2 3.9  CL 104  --  107 108  --   --   --  110 108  CO2 25  --  25 24  --   --   --  21* 22  GLUCOSE 252*  --  329* 311*  --   --   --  195* 134*  BUN 47*  --  54* 38*  --   --   --  30* 32*  CREATININE 1.53*  --  1.51* 1.22  --   --   --  1.23 1.40*  CALCIUM 7.4*  --  7.1* 7.2*  --   --   --  7.4* 7.5*  MG 1.9  --   --   --   --   --   --   --  2.0  PHOS 2.7  --  2.6 1.5*  --  3.5 2.5  --  3.8   < > = values in this interval not displayed.    Liver Function Tests: Recent Labs  Lab 10/13/20 1513 10/14/20 0345 10/14/20 1550 10/26/2020 0547  AST 56* 43* 42* 43*  ALT 43 38 36 40  ALKPHOS 81 62  60 58  BILITOT 0.7 1.3* 1.4* 1.1  PROT 5.9* 5.0* 5.0* 5.0*  ALBUMIN 2.9* 2.4* 2.2* 1.9*   No results for input(s): LIPASE, AMYLASE in the last 168 hours. No results for input(s): AMMONIA in the last 168 hours.  CBC: Recent Labs  Lab 10/14/20 0345 10/14/20 1550 10/26/2020 0547 10/16/2020 1114 10/16/20 0158 10/17/20 0308 10/17/20 1548 10/18/20 0404 10/19/20 0324  WBC 10.3   < > 9.8  --  10.6* 9.4  --  9.9 12.4*  NEUTROABS 8.0*  --   --   --   --   --   --   --   --   HGB 10.6*   < > 8.5*   < > 8.3* 7.7* 7.5* 8.0* 8.1*  HCT 31.9*   < > 26.2*   < > 25.9* 25.3* 22.0* 25.9* 25.4*  MCV 91.4   < > 93.2  --  95.2 95.5  --  95.2 94.1  PLT 319   < > 255  --  211 192  --  207 223   < > = values in this interval not displayed.    Cardiac Enzymes: No results for input(s): CKTOTAL, CKMB, CKMBINDEX, TROPONINI in the last 168 hours.  BNP: BNP (last 3 results) Recent Labs    10/13/20 1513  BNP 589.0*    ProBNP (last 3 results) No results for input(s): PROBNP in the last 8760 hours.    Other results:  Imaging: DG Abd 1 View  Result Date: 10/18/2020 CLINICAL DATA:   Abdominal distension. EXAM: ABDOMEN - 1 VIEW COMPARISON:  October 16, 2020. FINDINGS: The bowel gas pattern is normal. Distal tip of nasogastric tube is seen in the stomach. No radio-opaque calculi or other significant radiographic abnormality are seen. IMPRESSION: No evidence of bowel obstruction or ileus. Distal tip of nasogastric tube is seen in the stomach. Electronically Signed   By: Marijo Conception M.D.   On: 10/18/2020 09:31   DG CHEST PORT 1 VIEW  Result Date: 10/18/2020 CLINICAL DATA:  Intubation.  Respiratory failure.  Pneumonia. EXAM: PORTABLE CHEST 1 VIEW COMPARISON:  Chest x-ray 10/17/2020. FINDINGS: Endotracheal tube, NG tube in stable position. Swan-Ganz catheter tip noted over the right pulmonary artery in unchanged position. Heart size stable. Persistent unchanged multifocal severe bilateral pulmonary infiltrates. Small bilateral pleural effusions. No pneumothorax. IMPRESSION: 1. Lines and tubes in stable position. Swan-Ganz catheter tip noted over the right pulmonary artery in unchanged position. 2. Persistent unchanged multifocal severe bilateral pulmonary infiltrates. Small bilateral pleural effusions. Electronically Signed   By: Marcello Moores  Register   On: 10/18/2020 06:48   DG CHEST PORT 1 VIEW  Result Date: 10/17/2020 CLINICAL DATA:  Endotracheal tube. EXAM: PORTABLE CHEST 1 VIEW COMPARISON:  Same day. FINDINGS: Endotracheal and nasogastric tubes are unchanged in position. Swan-Ganz catheter is unchanged. Stable bilateral lung opacities are noted concerning for multifocal pneumonia. No pneumothorax is noted. Small pleural effusions cannot be excluded. Bony thorax is unremarkable. IMPRESSION: Stable support apparatus. Stable bilateral lung opacities concerning for multifocal pneumonia. Electronically Signed   By: Marijo Conception M.D.   On: 10/17/2020 13:59   DG CHEST PORT 1 VIEW  Result Date: 10/17/2020 CLINICAL DATA:  Hypoxia EXAM: PORTABLE CHEST 1 VIEW COMPARISON:  10/16/2020 and  prior. FINDINGS: Increased conspicuity of bilateral pulmonary opacities. No pneumothorax or pleural effusion. Stable cardiomediastinal silhouette. Right IJ approach Swan-Ganz catheter tip overlying the right main pulmonary artery, unchanged. Stable ETT. Partially imaged enteric tube. IMPRESSION: Increased conspicuity of bilateral  pulmonary opacities. Stable support lines and tubes. Electronically Signed   By: Primitivo Gauze M.D.   On: 10/17/2020 08:07     Medications:     Scheduled Medications: . aspirin  81 mg Per Tube Daily  . chlorhexidine gluconate (MEDLINE KIT)  15 mL Mouth Rinse BID  . Chlorhexidine Gluconate Cloth  6 each Topical Daily  . diazepam  2.5 mg Per Tube Q6H  . docusate  100 mg Per Tube BID  . ezetimibe  10 mg Per Tube Daily  . feeding supplement (PROSource TF)  90 mL Per Tube BID  . heparin injection (subcutaneous)  5,000 Units Subcutaneous Q8H  . insulin aspart  0-15 Units Subcutaneous Q4H  . insulin aspart  3 Units Subcutaneous Q4H  . insulin detemir  15 Units Subcutaneous BID  . ipratropium-albuterol  3 mL Nebulization Q6H  . lactulose  60 g Per Tube BID  . mouth rinse  15 mL Mouth Rinse 10 times per day  . pantoprazole sodium  40 mg Per Tube Q1200  . polyethylene glycol  17 g Per Tube Daily  . pregabalin  75 mg Per Tube BID  . rosuvastatin  20 mg Per Tube Daily  . sennosides  10 mL Oral BID  . sodium chloride flush  10-40 mL Intracatheter Q12H  . sodium chloride flush  3 mL Intravenous Q12H    Infusions: . sodium chloride    . sodium chloride Stopped (10/18/20 1958)  . ceFEPime (MAXIPIME) IV 2 g (10/18/20 2109)  . dexmedetomidine (PRECEDEX) IV infusion 0.9 mcg/kg/hr (10/19/20 0600)  . doxycycline (VIBRAMYCIN) IV Stopped (10/18/20 2356)  . feeding supplement (VITAL 1.5 CAL) 45 mL/hr at 10/19/20 0600  . fentaNYL infusion INTRAVENOUS 150 mcg/hr (10/19/20 0600)  . norepinephrine (LEVOPHED) Adult infusion Stopped (10/16/20 0146)  . vancomycin Stopped  (10/19/20 0435)  . vasopressin 0.01 Units/min (10/19/20 0600)    PRN Medications: Place/Maintain arterial line **AND** sodium chloride, sodium chloride, acetaminophen (TYLENOL) oral liquid 160 mg/5 mL, labetalol, midazolam, ondansetron **OR** ondansetron (ZOFRAN) IV, sodium chloride flush, sodium chloride flush   Assessment/Plan:   1. Acute Hypoxemic Respiratory Failure - due to PNA and HF - Developed acute respiratory failure on 12/4 and required intubation.  - CXR with pulmonary edema + infiltrates on 12/4.  - PCT 109 -> 84->43  - Developed mucous plugging 12/6 with severe respiratory distress and hypotension. Resolved with deep suctioning. Bronch with diffuse secretions.  - Extubated 12/8 but required re intubation.  - Sputum cultures w/ normal respiratory flora. No staph aureus or pseudomonas growing - on triple therapy abx, fever curve trending down  - vent management per PCCM   2. Shock - likely sepsis/cardiogenic combined. Co-ox down now off NE. Will need to restart.  - Lactic Acid 3.6>4.6 >2.8 > 1.8 > none today.  - Echo- reduced EF with EF 25-30% and WMA. RV normal.  - on VP 0.01. Hemodynamic looks good.  - On antimicrobial coverage for HCAP- expanded 12/8.  - Blood CX- NGTD   - Sputum Cx-  w/ normal respiratory flora. No staph aureus or pseudomonas growing  3. Acute Systolic Heart Failure  - As above Echo with EF 25-30% . No previous ECHO. No previous cath.  - Hs Trop [phone]. - RHC 12/6 with low filling pressures after diuresis and low index 1.9. SVR 1467 - Unable to add GDMT due to shock.  - off inotropes. CO-OX 52%. CI 2.4 . ? Restart of NE to help w/ inotropic support - CVP  13 - Suspect combination of septic and cardiogenic shock. (septic currently predominates) Troponin moderately elevated but flat. Possibility of Tako-tsubo/septic CM has been raised and seems to fit clinically however ECG als  4. Back Pain--> S/P L5-S1 laminectomy with fusion 09/28/2020   Per Neurosugery  5. AKI Creatinine trending down  1.3>1.4>1.8>1.7>1.5 > 1.5 > 1.2>1.4   6. Hypokalemia - Stable at 3.9 today  Length of Stay: [place] PA-C  10/19/2020, 7:50 AM  Advanced Heart Failure Team Pager [phone] (M-F; [place])  Please contact [facility] for night-coverage after hours (4p -7a ) and weekends on [email or web address]  Agree with above.   He looks worse today. Tm 102.6. Worsening resp status. BP and co-ox down.  CVP up.   General:  Ill appearing. On vent HEENT: normal + ETT Neck: supple. RIJ swan Carotids 2+ bilat; no bruits. No lymphadenopathy or thryomegaly appreciated. Cor: PMI nondisplaced. Regular rate & rhythm.  Lungs: rhonchorous Abdomen: soft, nontender, + distended. No hepatosplenomegaly. No bruits or masses. Good bowel sounds. Extremities: no cyanosis, clubbing, rash, 1+ edema + boots Neuro: intubated/sedated  He is worse today in setting of severe HCAP, severe ileus and new onset systolic HF. Co-ox down. CVP up. Would restart NE. Can pull swan and place PICC. Give one dose of IV lasix as BP tolerates.   Main issue remains severe PNA and ileus. D/w CCM - ? Repeat bronch +/- CT. Continue abx Possibility of meropenem d/w PharmD. Will start Reglan.   CRITICAL CARE Performed by: Glori Bickers  Total critical care time: 35 minutes  Critical care time was exclusive of separately billable procedures and treating other patients.  Critical care was necessary to treat or prevent imminent or life-threatening deterioration.  Critical care was time spent personally by me (independent of midlevel providers or residents) on the following activities: development of treatment plan with patient and/or surrogate as well as nursing, discussions with consultants, evaluation of patient's response to treatment, examination of patient, obtaining history from patient or surrogate, ordering and performing treatments and interventions, ordering and review  of laboratory studies, ordering and review of radiographic studies, pulse oximetry and re-evaluation of patient's condition.   Glori Bickers, MD  9:10 AM

## 2020-10-19 NOTE — Progress Notes (Addendum)
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 He had no operative complications and was discharged home. He returned to [facility] 10 days post-op due to fatigue, weakness, and poorly controlled back pain. He was also found to have sick contact and he was admit to neurosurgery service for pain control, IV hydration and possible dispo to CIR.   Past Medical History  CAD, CKD3, HTN, [facility] Events   11/01/2020 Admisson 12/10 for hypoxic this morning, increased work of breathing.  Seem to respond nicely to titration of PEEP.  Changed sedation to propofol and fentanyl.  Broad-spectrum antibiotics continued.  Primary barriers to progress seem to be atelectasis, adynamic ileus, resolving pneumonia, and severe deconditioning all superimposed on his underlying stress cardiomyopathy  Consults:  Neurosurg  Procedures:  10/13/20 Intubation 12/5 bronch  Significant Diagnostic Tests:  10/11/20 Chest X-ray, minimal left basilar atelectasis 12/3 LE venous doppler: neg 12/4 echo: LVEF 25-30%  Micro Data:  10/14/2020 COVID, Flu neg 12/4:  MRSA PCR neg 12/4 Bronch tracheal aspirate - GNR, GPC growing 12/4 blood: ngtd  Antimicrobials:  Vanc>> Cefepime>> Doxycycline>>12/9 Interim history/subjective:  Looks a little more comfortable after titration of medications and PEEP  Objective   Blood pressure 128/88, pulse 90, temperature 100.04 F (37.8 C), resp. rate (Abnormal) 35, height 5\' 7"  (1.702 m), weight 75.9 kg, SpO2 94 %. PAP: (22-64)/(1-23) 64/13 CVP:  [5 mmHg-12 mmHg] 7 mmHg PCWP:  [11 mmHg] 11 mmHg CO:  [5.4 L/min-6.6 L/min] 5.4  L/min CI:  [3 L/min/m2-3.6 L/min/m2] 3 L/min/m2  Vent Mode: PRVC FiO2 (%):  [40 %-60 %] 60 % Set Rate:  [20 bmp] 20 bmp Vt Set:  [520 mL] 520 mL PEEP:  [5 [id number] cmH20] 12 cmH20 Plateau Pressure:  [21 cmH20] 21 cmH20   Intake/Output Summary (Last 24 hours) at 10/19/2020 0849 Last data filed at 10/19/2020 0800 Gross per 24 hour  Intake 3397.66 ml  Output 5610 ml  Net -2212.34 ml   Filed Weights   10/14/2020 0500 10/16/20 0432 10/17/20 0500  Weight: 69.5 kg 70.4 kg 75.9 kg   General this is an 85 year old white male he is currently sedated on propofol and fentanyl infusion does exhibit some mild accessory use intermittently gets agitated and restless endorsing discomfort HEENT normocephalic atraumatic no JVD Pulmonary scattered rhonchi did have significant accessory use prior to titration of PEEP and FiO2 currently PEEP 12 FiO2 60% Plateau pressures less than 16 Cardiac: Regular, tachycardic no murmur rub or gallop Abdomen distended, tympanic percussion, hypoactive, liquid stools Extremities Slightly mottled, pulses palpable Neuro opens eyes interactive generalized weakness GU concentrated yellow urine  Resolved Hospital Problem list     Assessment & Plan:   L5-S1 DJD s/p laminectomy/decompression Post-op day 17. Per neurosurgery, no operative complications. Difficulty with post-op deconditioning and pain control. Plan Per neuro surg   Acute hypoxic respiratory failure due to acute pulm edema and likely aspiration pneumonitis vs HCAP pcxr from 12/9 w/ diffuse bilateral R> L airspace disease w/ more dense areas of consolidation and atelectasis on right.  His vent mechanics and work of breathing look terrible today (12/10) required escalation of PEEP  and changing his sedation to propofol. I suspect this is a mix of atx, mucous plugging and decreased cAbd from abd distention  Plan Cont full vent support Titrated peep up to 12-->will hold and titrate FIO2 for sats > 90% Add HT  saline Nebs Repeat abd 1 view. His abd is still very distended in spite of liquid stools Repeat cxr->may need bronch/lavage Cont abx day 7 of cefepime, vanc and doxy. 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  He has been more hypotensive after titration up of sedating medications Plan  Keep euvolemic Added back norepinephrine Continuing vasopressin Repeat SVO 2 per heart failure team and defer additional therapy per them  AKI due to ATN-, renal function a little worse today comparing from day prior Plan Keep euvolemic  map goal greater than 65 Renal dose medications Strict intake output  DM2- A1c 7.7%, hyperglycemic still Plan Sliding scale insulin Continue Levemir As we will be holding off on tube feeds will wait off on titration of basal dosing.  Anemia-  Plan Monitor for bleeding  Deconditioning-suspect he is going to need quite some time to recover, I worry this is going to be a very prolonged process, have encouraged family to discuss goals of care  Best practice (evaluated daily)   Diet: TF-->placed on hold for ileus  12/10 Pain/Anxiety/Delirium protocol (if indicated): Precedex changed to prop. Cont fent  VAP protocol (if indicated): ordered DVT prophylaxis: heparin GI prophylaxis: PPI Glucose control: see above Mobility: BR Code Status: Full Disposition: ICU  Critical care time 60 minutes  Erick Colace ACNP-BC [facility] Pager # [phone] OR # [phone] if no answer

## 2020-10-19 NOTE — Progress Notes (Signed)
Patient with worsening pulmonary function.  Becoming more febrile.  Heart rate stable.  Blood pressure requiring support.  Unfortunately patient's pulmonary status is failing.  He has significant cardiac dysfunction as well.  Continue with IV antibiotics and maximal supportive care for now.  Family is aware that situation is worsening and they recognize that this is something we may not be able to turn around.

## 2020-10-19 NOTE — Progress Notes (Signed)
Peripherally Inserted Central Catheter Placement  The IV Nurse has discussed with the patient and/or persons authorized to consent for the patient, the purpose of this procedure and the potential benefits and risks involved with this procedure.  The benefits include less needle sticks, lab draws from the catheter, and the patient may be discharged home with the catheter. Risks include, but not limited to, infection, bleeding, blood clot (thrombus formation), and puncture of an artery; nerve damage and irregular heartbeat and possibility to perform a PICC exchange if needed/ordered by physician.  Alternatives to this procedure were also discussed.  Bard Power PICC patient education guide, fact sheet on infection prevention and patient information card has been provided to patient /or left at bedside.    PICC Placement Documentation  PICC Triple Lumen 67/34/19 PICC Left Basilic 47 cm 1 cm (Active)  Indication for Insertion or Continuance of Line Prolonged intravenous therapies 10/19/20 1248  Exposed Catheter (cm) 1 cm 10/19/20 1248  Site Assessment Dry;Clean;Intact 10/19/20 1248  Lumen #1 Status Flushed;Blood return noted 10/19/20 1248  Lumen #2 Status Flushed;Blood return noted 10/19/20 1248  Lumen #3 Status Flushed;Blood return noted 10/19/20 1248  Dressing Type Transparent;Other (Comment) 10/19/20 1248  Dressing Status Clean;Dry;Intact 10/19/20 1248  Antimicrobial disc in place? Yes 10/19/20 1248  Dressing Intervention New dressing 10/19/20 1248  Dressing Change Due 10/26/20 10/19/20 1248    [place] signed consent   Christella Noa Albarece 10/19/2020, 12:54 PM

## 2020-10-20 LAB — GLUCOSE, CAPILLARY
Glucose-Capillary: 101 mg/dL — ABNORMAL HIGH (ref 70–99)
Glucose-Capillary: 125 mg/dL — ABNORMAL HIGH (ref 70–99)
Glucose-Capillary: 150 mg/dL — ABNORMAL HIGH (ref 70–99)
Glucose-Capillary: 187 mg/dL — ABNORMAL HIGH (ref 70–99)
Glucose-Capillary: 226 mg/dL — ABNORMAL HIGH (ref 70–99)
Glucose-Capillary: 231 mg/dL — ABNORMAL HIGH (ref 70–99)

## 2020-10-20 LAB — COOXEMETRY PANEL
Carboxyhemoglobin: 1.1 % (ref 0.5–1.5)
Carboxyhemoglobin: 0.9 % (ref 0.5–1.5)
Methemoglobin: 0.8 % (ref 0.0–1.5)
Methemoglobin: 0.8 % (ref 0.0–1.5)
O2 Saturation: 80.6 %
O2 Saturation: 68.2 %
Total hemoglobin: 7.3 g/dL — ABNORMAL LOW (ref 12.0–16.0)
Total hemoglobin: 9.9 g/dL — ABNORMAL LOW (ref 12.0–16.0)

## 2020-10-20 LAB — BASIC METABOLIC PANEL
Anion gap: 11 (ref 5–15)
Anion gap: 11 (ref 5–15)
BUN: 52 mg/dL — ABNORMAL HIGH (ref 8–23)
BUN: 60 mg/dL — ABNORMAL HIGH (ref 8–23)
CO2: 19 mmol/L — ABNORMAL LOW (ref 22–32)
CO2: 19 mmol/L — ABNORMAL LOW (ref 22–32)
Calcium: 7.7 mg/dL — ABNORMAL LOW (ref 8.9–10.3)
Calcium: 8.3 mg/dL — ABNORMAL LOW (ref 8.9–10.3)
Chloride: 109 mmol/L (ref 98–111)
Chloride: 108 mmol/L (ref 98–111)
Creatinine, Ser: 2.18 mg/dL — ABNORMAL HIGH (ref 0.61–1.24)
Creatinine, Ser: 2.03 mg/dL — ABNORMAL HIGH (ref 0.61–1.24)
GFR, Estimated: 29 mL/min — ABNORMAL LOW (ref >60)
GFR, Estimated: 32 mL/min — ABNORMAL LOW (ref >60)
Glucose, Bld: 235 mg/dL — ABNORMAL HIGH (ref 70–99)
Glucose, Bld: 117 mg/dL — ABNORMAL HIGH (ref 70–99)
Potassium: 4.7 mmol/L (ref 3.5–5.1)
Potassium: 4.1 mmol/L (ref 3.5–5.1)
Sodium: 139 mmol/L (ref 135–145)
Sodium: 138 mmol/L (ref 135–145)

## 2020-10-20 LAB — PHOSPHORUS: Phosphorus: 5.9 mg/dL — ABNORMAL HIGH (ref 2.5–4.6)

## 2020-10-20 LAB — CBC
HCT: 26.7 % — ABNORMAL LOW (ref 39.0–52.0)
Hemoglobin: 8.4 g/dL — ABNORMAL LOW (ref 13.0–17.0)
MCH: 30.8 pg (ref 26.0–34.0)
MCHC: 31.5 g/dL (ref 30.0–36.0)
MCV: 97.8 fL (ref 80.0–100.0)
Platelets: 240 10*3/uL (ref 150–400)
RBC: 2.73 MIL/uL — ABNORMAL LOW (ref 4.22–5.81)
RDW: 13.4 % (ref 11.5–15.5)
WBC: 25.6 10*3/uL — ABNORMAL HIGH (ref 4.0–10.5)
nRBC: 0.4 % — ABNORMAL HIGH (ref 0.0–0.2)

## 2020-10-20 LAB — VANCOMYCIN, TROUGH: Vancomycin Tr: 25 ug/mL (ref 15–20)

## 2020-10-20 LAB — MAGNESIUM: Magnesium: 2.2 mg/dL (ref 1.7–2.4)

## 2020-10-20 LAB — TRIGLYCERIDES: Triglycerides: 117 mg/dL (ref <150)

## 2020-10-20 MED ORDER — VITAL HIGH PROTEIN PO LIQD: 1000.0000 mL | ORAL | Status: DC

## 2020-10-20 MED ORDER — QUETIAPINE FUMARATE 50 MG PO TABS
50.0000 mg | ORAL_TABLET | Freq: Two times a day (BID) | ORAL | Status: DC
  Administered 2020-10-20 – 2020-10-22 (×4): 50 mg
  Filled 2020-10-20 (×4): qty 1

## 2020-10-20 MED ORDER — VANCOMYCIN HCL 1250 MG/250ML IV SOLN
1250.0000 mg | INTRAVENOUS | Status: DC
  Filled 2020-10-20: qty 250

## 2020-10-20 MED ORDER — VITAL 1.5 CAL PO LIQD
1000.0000 mL | ORAL | Status: DC
  Administered 2020-10-22 – 2020-10-23 (×2): 1000 mL
  Filled 2020-10-20 (×2): qty 1000

## 2020-10-20 MED ORDER — SODIUM CHLORIDE 0.9 % IV SOLN
1.0000 g | Freq: Two times a day (BID) | INTRAVENOUS | Status: AC
  Administered 2020-10-20 – 2020-10-25 (×11): 1 g via INTRAVENOUS
  Filled 2020-10-20 (×14): qty 1

## 2020-10-20 MED ORDER — NOREPINEPHRINE 16 MG/250ML-% IV SOLN
0.0000 ug/min | INTRAVENOUS | Status: DC
  Administered 2020-10-21: 02:00:00 3 ug/min via INTRAVENOUS
  Administered 2020-10-22: 13:00:00 10 ug/min via INTRAVENOUS
  Filled 2020-10-20 (×2): qty 250

## 2020-10-20 NOTE — Progress Notes (Signed)
Nutrition Follow-up / Consult  DOCUMENTATION CODES:   Non-severe (moderate) malnutrition in context of chronic illness  INTERVENTION:   Tube feeding via OG tube: Vital 1.5 at 20 ml/h, increase by 10 ml every 4 hours to goal rate of 45 ml/h (1080 ml per day) Prosource TF 90 ml BID  Provides 1780 kcal (2020 kcal total with propofol), 117 gm protein, 820 ml free water daily.  If patient does not tolerate advancement of TF rate, recommend post-pyloric Cortrak tube.  NUTRITION DIAGNOSIS:   Moderate Malnutrition related to chronic illness as evidenced by moderate fat depletion,severe muscle depletion,moderate muscle depletion.  Ongoing   GOAL:   Patient will meet greater than or equal to 90% of their needs  Progressing  MONITOR:   TF tolerance,Labs  REASON FOR ASSESSMENT:   Consult Enteral/tube feeding initiation and management  ASSESSMENT:   Pt with PMH of HTN, HLD, stage III CKD, CAD, who recently was admitted 09/29/20 for L5-S1 decompression and fusion and discharged home now readmitted for poorly controlled back and bilateral lower extremity pain and PNA.  12/08 Extubated, but required re-intubation. 12/10 TF held; KUB showed ileus; reglan initiated.  Received MD Consult for TF initiation and management. OG tube in place. Currently receiving Vital 1.5 at 20 ml/h with Prosource TF 90 ml BID.   Patient is currently intubated on ventilator support MV: 13.7 L/min Temp (24hrs), Avg:99.1 F (37.3 C), Min:96.4 F (35.8 C), Max:102.56 F (39.2 C)  Propofol: 9.1 ml/hr providing 240 kcal from lipid  Labs reviewed. Phos 5.9 CBG: [phone]  Medications reviewed and include novolog, levemir, reglan 5 mg q 8 h, propofol. Currently off levophed and vasopressin.  I/O +691 ml since admission UOP 1580 ml x 24 hours Stool 500 ml via rectal tube x 24 hours  Weight 69.5 kg (12/6), currently 76.4 kg  Diet Order:   Diet Order            Diet NPO time specified  Diet  effective now                 EDUCATION NEEDS:   No education needs have been identified at this time  Skin:  Skin Assessment: Reviewed RN Assessment (closed incision to back)  Last BM:  12/10 rectal tube  Height:   Ht Readings from Last 1 Encounters:  10/12/20 5\' 7"  (1.702 m)    Weight:   Wt Readings from Last 1 Encounters:  10/20/20 76.4 kg    Ideal Body Weight:  67.2 kg  BMI:  Body mass index is 26.38 kg/m.  Estimated Nutritional Needs:   Kcal:  2060  Protein:  105-120 grams  Fluid:  >1.8 L/day    Lucas Mallow, RD, LDN, CNSC Please refer to Amion for contact information.

## 2020-10-20 NOTE — Progress Notes (Signed)
Patient ID: Dennis Carey, male   DOB: 10-19-1935, 86 y.o.   MRN: [id number]     Advanced Heart Failure Rounding Note   Subjective:   12/7 Bronch with copious secretions that were removed.  12/8 Extubated but required re intubation.   Continues on Vent w/ heavy secretions. FiO2 40%.   Sputum cultures w/ normal respiratory flora. No staph aureus or pseudomonas growing.  Now afebrile. WBC 9.9>>12.4>>25.6. Continues on cefepime, doxy and vanc.   Off NE. Currently on vasopressin 0.02 units. CO-OX 68%.   Creatinine up to 2.18 today.  MAP 80s.  CVP 8. I/Os net negative.   Objective:   Weight Range:  Vital Signs:   Temp:  [96.4 F (35.8 C)-103.28 F (39.6 C)] 96.4 F (35.8 C) (12/11 0733) Pulse Rate:  [70-128] 85 (12/11 0806) Resp:  [11-35] 15 (12/11 0700) BP: (83-174)/(40-109) 132/63 (12/11 0700) SpO2:  [93 %-100 %] 98 % (12/11 0806) Arterial Line BP: (94-248)/(34-86) 161/53 (12/11 0700) FiO2 (%):  [40 %-60 %] 40 % (12/11 0806) Weight:  [76.4 kg] 76.4 kg (12/11 0416) Last BM Date: 10/19/20  Weight change: Filed Weights   10/16/20 0432 10/17/20 0500 10/20/20 0416  Weight: 70.4 kg 75.9 kg 76.4 kg    Intake/Output:   Intake/Output Summary (Last 24 hours) at 10/20/2020 0955 Last data filed at 10/20/2020 0700 Gross per 24 hour  Intake 2181.76 ml  Output 2390 ml  Net -208.24 ml     Physical Exam: CVP 8 General: Sedated on vent.  Neck: No JVD, no thyromegaly or thyroid nodule.  Lungs: Crackles at bases.  CV: Nondisplaced PMI.  Heart regular S1/S2, no S3/S4, no murmur.  No peripheral edema.  Abdomen: Soft, nontender, no hepatosplenomegaly, mild distention. No BS.  Skin: Intact without lesions or rashes.  Neurologic: Sedated on vent.  Extremities: No clubbing or cyanosis.  HEENT: Normal.   Telemetry: NSR 80s  Labs: Basic Metabolic Panel: Recent Labs  Lab 11/09/2020 0547 10/14/2020 1114 10/16/20 0158 10/17/20 0308 10/17/20 1548 10/17/20 2000 10/18/20 0404  10/18/20 0734 10/19/20 0324 10/20/20 0233  NA 140   < > 141 140 145  --   --  141 141 139  K 3.5   < > 4.1 3.3* 3.7  --   --  4.2 3.9 4.7  CL 104  --  107 108  --   --   --  110 108 109  CO2 25  --  25 24  --   --   --  21* 22 19*  GLUCOSE 252*  --  329* 311*  --   --   --  195* 134* 235*  BUN 47*  --  54* 38*  --   --   --  30* 32* 52*  CREATININE 1.53*  --  1.51* 1.22  --   --   --  1.23 1.40* 2.18*  CALCIUM 7.4*  --  7.1* 7.2*  --   --   --  7.4* 7.5* 7.7*  MG 1.9  --   --   --   --   --   --   --  2.0 2.2  PHOS 2.7  --  2.6 1.5*  --  3.5 2.5  --  3.8 5.9*   < > = values in this interval not displayed.    Liver Function Tests: Recent Labs  Lab 10/13/20 1513 10/14/20 0345 10/14/20 1550 10/13/2020 0547  AST 56* 43* 42* 43*  ALT 43 38 36 40  ALKPHOS  81 62 60 58  BILITOT 0.7 1.3* 1.4* 1.1  PROT 5.9* 5.0* 5.0* 5.0*  ALBUMIN 2.9* 2.4* 2.2* 1.9*   No results for input(s): LIPASE, AMYLASE in the last 168 hours. No results for input(s): AMMONIA in the last 168 hours.  CBC: Recent Labs  Lab 10/14/20 0345 10/14/20 1550 10/16/20 0158 10/17/20 0308 10/17/20 1548 10/18/20 0404 10/19/20 0324 10/20/20 0233  WBC 10.3   < > 10.6* 9.4  --  9.9 12.4* 25.6*  NEUTROABS 8.0*  --   --   --   --   --   --   --   HGB 10.6*   < > 8.3* 7.7* 7.5* 8.0* 8.1* 8.4*  HCT 31.9*   < > 25.9* 25.3* 22.0* 25.9* 25.4* 26.7*  MCV 91.4   < > 95.2 95.5  --  95.2 94.1 97.8  PLT 319   < > 211 192  --  207 223 240   < > = values in this interval not displayed.    Cardiac Enzymes: No results for input(s): CKTOTAL, CKMB, CKMBINDEX, TROPONINI in the last 168 hours.  BNP: BNP (last 3 results) Recent Labs    10/13/20 1513  BNP 589.0*    ProBNP (last 3 results) No results for input(s): PROBNP in the last 8760 hours.    Other results:  Imaging: DG Chest Port 1 View  Result Date: 10/19/2020 CLINICAL DATA:  Endotracheal tube. EXAM: PORTABLE CHEST 1 VIEW COMPARISON:  10/18/2020 FINDINGS:  Endotracheal tube tip 2 cm above the carina. Swan-Ganz catheter in the right lower lobe pulmonary artery distally unchanged. NG tube enters the stomach with the tip not visualized. Improvement in bilateral airspace disease mostly in the upper lobes. Small left pleural effusion. No pneumothorax identified. IMPRESSION: Bilateral airspace disease with improvement from yesterday. No pneumothorax. Electronically Signed   By: Franchot Gallo M.D.   On: 10/19/2020 09:33   DG Abd Portable 1V  Result Date: 10/19/2020 CLINICAL DATA:  Evaluate bowel gas pattern.  Evaluate for ileus. EXAM: PORTABLE ABDOMEN - 1 VIEW COMPARISON:  10/18/2020 and chest radiograph 10/19/2020 FINDINGS: A Swan-Ganz catheter extending into the right infrahilar region and probably into a right lower lobe pulmonary artery. Findings are similar to the recent chest examinations. There is a nasogastric tube in the mid abdomen near the distal stomach. Gas-filled bowel structures throughout the abdomen are similar to the recent comparison examination. Postsurgical changes near the lumbosacral junction. Basilar chest densities are incompletely evaluated. Limited evaluation for free air. IMPRESSION: 1. Gas-filled loops of bowel throughout the abdomen most likely represent colon. Findings are similar to the previous examination. Findings could be related to a colonic ileus. 2. Stable position of the nasogastric tube in the distal stomach region. 3. Stable position of Swan-Ganz catheter. Electronically Signed   By: Markus Daft M.D.   On: 10/19/2020 09:26   VAS [place] LOWER EXTREMITY VENOUS (DVT)  Result Date: 10/19/2020  Lower Venous DVT Study Indications: Swelling.  Limitations: Bandages. Comparison Study: 10/12/20 previous Performing Technologist: Abram Sander RVS  Examination Guidelines: A complete evaluation includes B-mode imaging, spectral Doppler, color Doppler, and power Doppler as needed of all accessible portions of each vessel. Bilateral testing is  considered an integral part of a complete examination. Limited examinations for reoccurring indications may be performed as noted. The reflux portion of the exam is performed with the patient in reverse Trendelenburg.  +---------+---------------+---------+-----------+----------+--------------+ RIGHT    CompressibilityPhasicitySpontaneityPropertiesThrombus Aging +---------+---------------+---------+-----------+----------+--------------+ CFV  bandages       +---------+---------------+---------+-----------+----------+--------------+ SFJ                                                   bandages       +---------+---------------+---------+-----------+----------+--------------+ FV Prox  Full                                                        +---------+---------------+---------+-----------+----------+--------------+ FV Mid   Full                                                        +---------+---------------+---------+-----------+----------+--------------+ FV DistalFull                                                        +---------+---------------+---------+-----------+----------+--------------+ PFV      Full                                                        +---------+---------------+---------+-----------+----------+--------------+ POP      Full           Yes      Yes                                 +---------+---------------+---------+-----------+----------+--------------+ PTV      Full                                                        +---------+---------------+---------+-----------+----------+--------------+ PERO     Full                                                        +---------+---------------+---------+-----------+----------+--------------+   +---------+---------------+---------+-----------+----------+--------------+ LEFT      CompressibilityPhasicitySpontaneityPropertiesThrombus Aging +---------+---------------+---------+-----------+----------+--------------+ CFV      Full           Yes      Yes                                 +---------+---------------+---------+-----------+----------+--------------+ SFJ      Full                                                        +---------+---------------+---------+-----------+----------+--------------+  FV Prox  Full                                                        +---------+---------------+---------+-----------+----------+--------------+ FV Mid   Full                                                        +---------+---------------+---------+-----------+----------+--------------+ FV DistalFull                                                        +---------+---------------+---------+-----------+----------+--------------+ PFV      Full                                                        +---------+---------------+---------+-----------+----------+--------------+ POP      Full           Yes      Yes                                 +---------+---------------+---------+-----------+----------+--------------+ PTV      Full                                                        +---------+---------------+---------+-----------+----------+--------------+ PERO     Full                                                        +---------+---------------+---------+-----------+----------+--------------+     Summary: BILATERAL: - No evidence of deep vein thrombosis seen in the lower extremities, bilaterally. - No evidence of superficial venous thrombosis in the lower extremities, bilaterally. -No evidence of popliteal cyst, bilaterally.   *See table(s) above for measurements and observations.    Preliminary    [place] EKG SITE RITE  Result Date: 10/19/2020 If Site Rite image not attached, placement could not be confirmed due to current  cardiac rhythm.    Medications:     Scheduled Medications: . aspirin  81 mg Per Tube Daily  . chlorhexidine gluconate (MEDLINE KIT)  15 mL Mouth Rinse BID  . Chlorhexidine Gluconate Cloth  6 each Topical Daily  . diazepam  2.5 mg Per Tube Q6H  . ezetimibe  10 mg Per Tube Daily  . feeding supplement (PROSource TF)  90 mL Per Tube BID  . feeding supplement (VITAL HIGH PROTEIN)  1,000 mL Per Tube Q24H  . fentaNYL (SUBLIMAZE) injection  25 mcg Intravenous Once  . heparin injection (subcutaneous)  5,000 Units Subcutaneous Q8H  . insulin aspart  0-15 Units Subcutaneous Q4H  . insulin aspart  3 Units Subcutaneous Q4H  . insulin detemir  15 Units Subcutaneous BID  . ipratropium-albuterol  3 mL Nebulization Q6H  . mouth rinse  15 mL Mouth Rinse 10 times per day  . metoCLOPramide (REGLAN) injection  5 mg Intravenous Q8H  . pantoprazole sodium  40 mg Per Tube Q1200  . pregabalin  75 mg Per Tube BID  . rosuvastatin  20 mg Per Tube Daily  . sodium chloride flush  10-40 mL Intracatheter Q12H  . sodium chloride flush  10-40 mL Intracatheter Q12H  . sodium chloride flush  3 mL Intravenous Q12H  . sodium chloride HYPERTONIC  4 mL Nebulization BID    Infusions: . sodium chloride    . sodium chloride Stopped (10/18/20 1958)  . feeding supplement (VITAL 1.5 CAL) 20 mL/hr at 10/20/20 0700  . fentaNYL infusion INTRAVENOUS 75 mcg/hr (10/20/20 0708)  . meropenem (MERREM) IV 2 g (10/20/20 0851)  . norepinephrine (LEVOPHED) Adult infusion 17 mcg/min (10/19/20 2148)  . propofol (DIPRIVAN) infusion 12 mcg/kg/min (10/20/20 0700)  . vancomycin Stopped (10/19/20 1630)  . vasopressin 0.02 Units/min (10/20/20 0700)    PRN Medications: Place/Maintain arterial line **AND** sodium chloride, sodium chloride, acetaminophen (TYLENOL) oral liquid 160 mg/5 mL, fentaNYL, midazolam, ondansetron **OR** ondansetron (ZOFRAN) IV, sodium chloride flush, sodium chloride flush   Assessment/Plan:   1. Acute Hypoxemic  Respiratory Failure - due to PNA and HF - Developed acute respiratory failure on 12/4 and required intubation.  - CXR with pulmonary edema + infiltrates on 12/4.  - PCT 109 -> 84->43  - Developed mucous plugging 12/6 with severe respiratory distress and hypotension. Resolved with deep suctioning. Bronch with diffuse secretions.  - Extubated 12/8 but required re intubation.  - Sputum cultures w/ normal respiratory flora. No staph aureus or pseudomonas growing - on triple therapy abx, now afebrile but WBCs trending up.  - vent management per PCCM   2. Shock - likely sepsis/cardiogenic combined. Co-ox excellent today at 68%.   - MAP elevated but creatinine up to 2.18.   - Can stop vasopressin today.  - CVP 8, no diuretic with AKI.  - Echo- reduced EF with EF 25-30% and WMA. RV normal.  - On antimicrobial coverage for HCAP- expanded 12/8.  - Blood CX- NGTD   - Sputum Cx-  w/ normal respiratory flora. No staph aureus or pseudomonas growing  3. Acute Systolic Heart Failure  - As above Echo with EF 25-30% . No previous ECHO. No previous cath.  - Hs Trop [phone]. - RHC 12/6 with low filling pressures after diuresis and low index 1.9. SVR 1467 - Unable to add GDMT due to shock.  - off inotropes. CO-OX 68%.  - CVP 8, no diuretic today with AKI.  - Suspect combination of septic and cardiogenic shock, now appears resolved. Troponin moderately elevated but flat. Possibility of Tako-tsubo/septic CM has been raised and seems to fit clinically but cannot rule out CAD.  If he recovers and creatinine comes down, needs coronary angiography.   4. Back Pain--> S/P L5-S1 laminectomy with fusion 09/28/2020  Per Neurosugery  5. AKI Creatinine worsening today with stable MAP and co-ox.   - Vancomycin level high, discussed with pharmacy.  - No diuretics.   6. Ileus - Mild abdominal distention, no bowel sounds.  - Reglan.  - Wean sedation.   Length of Stay: 10  CRITICAL CARE Performed by:  [place] Aundra Dubin  Total critical care time: 35 minutes  Critical care time was exclusive of separately billable procedures and treating other patients.  Critical care was necessary to treat or prevent imminent or life-threatening deterioration.  Critical care was time spent personally by me (independent of midlevel providers or residents) on the following activities: development of treatment plan with patient and/or surrogate as well as nursing, discussions with consultants, evaluation of patient's response to treatment, examination of patient, obtaining history from patient or surrogate, ordering and performing treatments and interventions, ordering and review of laboratory studies, ordering and review of radiographic studies, pulse oximetry and re-evaluation of patient's condition.   Loralie Champagne, MD  9:55 AM

## 2020-10-20 NOTE — Progress Notes (Signed)
Neurosurgery Service Progress Note  Subjective: No acute events overnight, Dr. Aundra Dubin was in the room when I rounded to discuss Dr. Eleanora Neighbor case, no big changes in past 24h   Objective: Vitals:   10/20/20 0900 10/20/20 1000 10/20/20 1100 10/20/20 1134  BP: 131/67 130/62 (!) 147/62   Pulse:      Resp: 20 (!) 23 (!) 25   Temp:    98.9 F (37.2 C)  TempSrc:    Axillary  SpO2:      Weight:      Height:        Physical Exam: Intubated and sedated, incision c/d/i  Assessment & Plan: -No change in neurosurgical plan of care  Dennis Carey  10/20/20 12:09 PM

## 2020-10-20 NOTE — Progress Notes (Signed)
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 He had no operative complications and was discharged home. He returned to [facility] 10 days post-op due to fatigue, weakness, and poorly controlled back pain. He was also found to have sick contact and he was admit to neurosurgery service for pain control, IV hydration and possible dispo to CIR.   Past Medical History  CAD, CKD3, HTN, [facility]   10/16/2020 Admisson 12/10 for hypoxic this morning, increased work of breathing.  Seem to respond nicely to titration of PEEP.  Changed sedation to propofol and fentanyl.  Broad-spectrum antibiotics continued.  Primary barriers to progress seem to be atelectasis, adynamic ileus, resolving pneumonia, and severe deconditioning all superimposed on his underlying stress cardiomyopathy  Consults:  Neurosurg  Procedures:  10/13/20 Intubation 12/5 bronch  Significant Diagnostic Tests:  10/11/20 Chest X-ray, minimal left basilar atelectasis 12/3 LE venous doppler: neg 12/4 echo: LVEF 25-30% 12/10 KUB: Consistent with colonic ileus-gas present down to rectum Micro Data:  10/13/2020 COVID, Flu neg 12/4:  MRSA PCR neg 12/4 Bronch tracheal aspirate - GNR, GPC growing 12/4 blood: ngtd  Antimicrobials:  Vanc>> Cefepime>> Doxycycline>>12/9 Interim history/subjective:  Fever has improved.  Remains unresponsive on mechanical ventilation.  Objective   Blood pressure (!) 147/62, pulse 85, temperature 98.9 F (37.2 C), temperature source Axillary, resp. rate (!) 25, height 5\' 7"  (1.702 m), weight 76.4 kg, SpO2 98 %. PAP: (30-36)/(17-23)  36/23 CVP:  [5 mmHg-15 mmHg] 10 mmHg  Vent Mode: PRVC FiO2 (%):  [40 %-60 %] 40 % Set Rate:  [20 bmp] 20 bmp Vt Set:  [520 mL] 520 mL PEEP:  [12 cmH20] 12 cmH20 Plateau Pressure:  [25 cmH20] 25 cmH20   Intake/Output Summary (Last 24 hours) at 10/20/2020 1357 Last data filed at 10/20/2020 1100 Gross per 24 hour  Intake 1959.86 ml  Output 2680 ml  Net -720.14 ml   Filed Weights   10/16/20 0432 10/17/20 0500 10/20/20 0416  Weight: 70.4 kg 75.9 kg 76.4 kg   General: This is an 85 year old white male he is currently sedated on propofol and fentanyl infusion  HEENT: ET tube, OG tube in place Pulmonary: Clear throughout Cardiac: Regular, tachycardic no murmur rub or gallop Abdomen: distended, tympanic percussion, hypoactive, liquid stools Extremities Slightly mottled, pulses palpable Neuro: Once to voice GU: concentrated yellow urine  Resolved Hospital Problem list     Assessment & Plan:   L5-S1 DJD s/p laminectomy/decompression Acute hypoxic respiratory failure due to acute pulm edema and likely aspiration pneumonitis vs HCAP Adynamic ileus 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 -Has improved with meropenem likely better covering gut translocation, complete 7-day course -Commence trophic feeds -Continue Reglan, stop all other laxatives. -Wean sedation to permit initiation of SBT's  Best practice (evaluated daily)   Diet: TF-->placed on hold for ileus  12/10 Pain/Anxiety/Delirium protocol (if indicated): Precedex changed to prop. Cont fentanyl  VAP protocol (if indicated): ordered DVT prophylaxis: heparin twice daily GI prophylaxis: PPI Glucose control: see  above Mobility: BR Code Status: Full Communication: Son updated at bedside 12/11 Disposition: ICU  CRITICAL CARE Performed by: Kipp Brood   Total critical care time: 40 minutes  Critical care time was exclusive of separately billable procedures and treating other patients.  Critical care was necessary to treat or prevent imminent or life-threatening deterioration.  Critical care was time spent personally by me on the following activities: development of treatment plan with patient and/or surrogate as well as nursing, discussions with consultants, evaluation of patient's response to treatment, examination of patient, obtaining history from patient or surrogate, ordering and performing treatments and interventions, ordering and review of laboratory studies, ordering and review of radiographic studies, pulse oximetry, re-evaluation of patient's condition and participation in multidisciplinary rounds.  Kipp Brood, MD [facility] ICU Physician [place]  Pager: [phone] Mobile: [phone] After hours: [phone].

## 2020-10-20 NOTE — Progress Notes (Signed)
Pharmacy Antibiotic Note  Dennis Carey is a 85 y.o. male admitted on 11/03/2020 with back s/p L5-S1 decompression and fusion 10 days prior. Pharmacy has been consulted for meropenem and vancomycin dosing for pneumonia.   Scr up significantly this morning 1.4>2.1 today (CrCl ~20 mL/min). WBC up to 25, temp 103.2 - concern for adynamic ileus. Good uop 0.63m/kg/hr.   Vancomycin trough checked prior to morning dose was elevated at 25, given acute renal failure will hold dosing today and adjust dosing to start tomorrow. Will consider random level in am based on am labs.   Plan: - Vancomycin [id number] hours - starting 12/12 - Reduce Meropenem to 1g IV q 12 hrs  - Will continue to follow renal function, culture results, LOT, and antibiotic de-escalation plans   Height: _0  (170.2 cm) Weight: 76.4 kg (168 lb 6.9 oz) IBW/kg (Calculated) : 66.1  Temp (24hrs), Avg:101 F (38.3 C), Min:96.4 F (35.8 C), Max:103.28 F (39.6 C)  Recent Labs  Lab 10/14/20 1050 10/14/20 1550 11/01/2020 0547 10/16/20 0158 10/16/20 0947 10/17/20 0308 10/17/20 0427 10/18/20 0404 10/18/20 0734 10/19/20 0324 10/20/20 0233  WBC  --  12.6* 9.8 10.6*  --  9.4  --  9.9  --  12.4* 25.6*  CREATININE  --  1.72* 1.53* 1.51*  --  1.22  --   --  1.23 1.40* 2.18*  LATICACIDVEN 4.6* 2.8* 2.6*  --  3.4*  --  1.8  --   --   --   --   VANCOTROUGH  --   --   --   --   --   --   --   --   --   --  25*    Estimated Creatinine Clearance: 24 mL/min (A) (by C-G formula based on SCr of 2.18 mg/dL (H)).    Allergies  Allergen Reactions  . Sulfa Antibiotics Hives    Antimicrobials this admission: Vancomycin 12/4 x1, 12/8>>  Cefepime 12/4 >> 12/10 Doxy 12/6>>12/9 Meropenem 12/10>>  Microbiology results: 12/4 MRSA PCR - neg 12/4 BCx - ngtd 12/4 TA -  mod GPC+ GNR, abund WBC (reicnubated)>nf 12/5 TA - few nf 12/7 TA (BAL) - few normal flora  12/10 cdiff - neg  Thank you for allowing pharmacy to be a part of this  patient's care.  FrErin HearingharmD., BCPS Clinical Pharmacist 10/20/2020 11:38 AM   Please check AMION for all [place] numbers After 10:00 PM, call [phone]

## 2020-10-21 ENCOUNTER — Inpatient Hospital Stay (HOSPITAL_COMMUNITY): Payer: Medicare Other

## 2020-10-21 LAB — BASIC METABOLIC PANEL
Anion gap: 12 (ref 5–15)
BUN: 71 mg/dL — ABNORMAL HIGH (ref 8–23)
CO2: 18 mmol/L — ABNORMAL LOW (ref 22–32)
Calcium: 7.8 mg/dL — ABNORMAL LOW (ref 8.9–10.3)
Chloride: 112 mmol/L — ABNORMAL HIGH (ref 98–111)
Creatinine, Ser: 2.37 mg/dL — ABNORMAL HIGH (ref 0.61–1.24)
GFR, Estimated: 27 mL/min — ABNORMAL LOW (ref >60)
Glucose, Bld: 138 mg/dL — ABNORMAL HIGH (ref 70–99)
Potassium: 4.1 mmol/L (ref 3.5–5.1)
Sodium: 142 mmol/L (ref 135–145)

## 2020-10-21 LAB — CBC
HCT: 26.3 % — ABNORMAL LOW (ref 39.0–52.0)
Hemoglobin: 8 g/dL — ABNORMAL LOW (ref 13.0–17.0)
MCH: 29.1 pg (ref 26.0–34.0)
MCHC: 30.4 g/dL (ref 30.0–36.0)
MCV: 95.6 fL (ref 80.0–100.0)
Platelets: 243 10*3/uL (ref 150–400)
RBC: 2.75 MIL/uL — ABNORMAL LOW (ref 4.22–5.81)
RDW: 13.5 % (ref 11.5–15.5)
WBC: 20.8 10*3/uL — ABNORMAL HIGH (ref 4.0–10.5)
nRBC: 0.2 % (ref 0.0–0.2)

## 2020-10-21 LAB — VANCOMYCIN, TROUGH: Vancomycin Tr: 16 ug/mL (ref 15–20)

## 2020-10-21 LAB — POCT I-STAT 7, (LYTES, BLD GAS, ICA,H+H)
Acid-base deficit: 6 mmol/L — ABNORMAL HIGH (ref 0.0–2.0)
Bicarbonate: 18.4 mmol/L — ABNORMAL LOW (ref 20.0–28.0)
Calcium, Ion: 1.12 mmol/L — ABNORMAL LOW (ref 1.15–1.40)
HCT: 21 % — ABNORMAL LOW (ref 39.0–52.0)
Hemoglobin: 7.1 g/dL — ABNORMAL LOW (ref 13.0–17.0)
O2 Saturation: 97 %
Patient temperature: 98.1
Potassium: 3.9 mmol/L (ref 3.5–5.1)
Sodium: 144 mmol/L (ref 135–145)
TCO2: 19 mmol/L — ABNORMAL LOW (ref 22–32)
pCO2 arterial: 31.9 mmHg — ABNORMAL LOW (ref 32.0–48.0)
pH, Arterial: 7.368 (ref 7.350–7.450)
pO2, Arterial: 92 mmHg (ref 83.0–108.0)

## 2020-10-21 LAB — GLUCOSE, CAPILLARY
Glucose-Capillary: 110 mg/dL — ABNORMAL HIGH (ref 70–99)
Glucose-Capillary: 126 mg/dL — ABNORMAL HIGH (ref 70–99)
Glucose-Capillary: 158 mg/dL — ABNORMAL HIGH (ref 70–99)
Glucose-Capillary: 177 mg/dL — ABNORMAL HIGH (ref 70–99)
Glucose-Capillary: 201 mg/dL — ABNORMAL HIGH (ref 70–99)
Glucose-Capillary: 153 mg/dL — ABNORMAL HIGH (ref 70–99)

## 2020-10-21 LAB — COOXEMETRY PANEL
Carboxyhemoglobin: 1.2 % (ref 0.5–1.5)
Methemoglobin: 1 % (ref 0.0–1.5)
O2 Saturation: 60.6 %
Total hemoglobin: 7.9 g/dL — ABNORMAL LOW (ref 12.0–16.0)

## 2020-10-21 LAB — TRIGLYCERIDES: Triglycerides: 138 mg/dL (ref <150)

## 2020-10-21 LAB — MAGNESIUM: Magnesium: 2.3 mg/dL (ref 1.7–2.4)

## 2020-10-21 LAB — PHOSPHORUS: Phosphorus: 5.5 mg/dL — ABNORMAL HIGH (ref 2.5–4.6)

## 2020-10-21 MED ORDER — SODIUM CHLORIDE 0.9 % IV BOLUS
1000.0000 mL | Freq: Once | INTRAVENOUS | Status: AC
  Administered 2020-10-21: 17:00:00 1000 mL via INTRAVENOUS

## 2020-10-21 MED ORDER — AMIODARONE HCL IN DEXTROSE 360-4.14 MG/200ML-% IV SOLN
60.0000 mg/h | INTRAVENOUS | Status: DC
  Administered 2020-10-21 (×2): 60 mg/h via INTRAVENOUS
  Filled 2020-10-21: qty 200

## 2020-10-21 MED ORDER — AMIODARONE LOAD VIA INFUSION
150.0000 mg | Freq: Once | INTRAVENOUS | Status: AC
  Administered 2020-10-21: 17:00:00 150 mg via INTRAVENOUS
  Filled 2020-10-21: qty 83.34

## 2020-10-21 MED ORDER — AMIODARONE HCL IN DEXTROSE 360-4.14 MG/200ML-% IV SOLN
INTRAVENOUS | Status: AC
  Filled 2020-10-21: qty 200

## 2020-10-21 MED ORDER — FUROSEMIDE 10 MG/ML IJ SOLN
80.0000 mg | Freq: Once | INTRAMUSCULAR | Status: AC
  Administered 2020-10-21: 11:00:00 80 mg via INTRAVENOUS
  Filled 2020-10-21: qty 8

## 2020-10-21 MED ORDER — ALBUMIN HUMAN 5 % IV SOLN
INTRAVENOUS | Status: AC
  Filled 2020-10-21: qty 250

## 2020-10-21 MED ORDER — AMIODARONE HCL IN DEXTROSE 360-4.14 MG/200ML-% IV SOLN
30.0000 mg/h | INTRAVENOUS | Status: DC
  Administered 2020-10-21 – 2020-10-25 (×8): 30 mg/h via INTRAVENOUS
  Filled 2020-10-21 (×6): qty 200

## 2020-10-21 MED ORDER — VANCOMYCIN HCL 1250 MG/250ML IV SOLN
1250.0000 mg | INTRAVENOUS | Status: DC
  Administered 2020-10-21: 17:00:00 1250 mg via INTRAVENOUS
  Filled 2020-10-21: qty 250

## 2020-10-21 MED ORDER — CLONAZEPAM 0.25 MG PO TBDP
0.2500 mg | ORAL_TABLET | Freq: Two times a day (BID) | ORAL | Status: DC
  Administered 2020-10-21 – 2020-10-22 (×3): 0.25 mg
  Filled 2020-10-21 (×3): qty 1

## 2020-10-21 NOTE — Progress Notes (Signed)
Pharmacy Antibiotic Note  Dennis Carey is a 85 y.o. male admitted on 11/07/2020 with back s/p L5-S1 decompression and fusion 10 days prior. Pharmacy has been consulted for meropenem and vancomycin dosing for pneumonia.   Scr up significantly this morning 1.4>2.12.3 today (CrCl ~20 mL/min). WBC down to 20, temp 101.4 - concern for adynamic ileus. Good uop 0.53m/kg/hr.   Vancomycin trough checked prior to morning now at goal at 16, scr worse at 2.3 this morning.   Plan: - Vancomycin [id number] hours - follow need for addition random levels - Reduce Meropenem to 1g IV q 12 hrs -low threshold to reduce further - Will continue to follow renal function, culture results, LOT, and antibiotic de-escalation plans   Height: _0  (170.2 cm) Weight: 72.3 kg (159 lb 6.3 oz) IBW/kg (Calculated) : 66.1  Temp (24hrs), Avg:99.3 F (37.4 C), Min:98.1 F (36.7 C), Max:101.8 F (38.8 C)  Recent Labs  Lab 10/14/20 1050 10/14/20 1550 10/14/20 1550 10/23/2020 0547 10/16/20 0158 10/16/20 0947 10/17/20 0308 10/17/20 0427 10/18/20 0404 10/18/20 0734 10/19/20 0324 10/20/20 0233 10/20/20 1433 10/21/20 0500  WBC  --  12.6*   < > 9.8   < >  --  9.4  --  9.9  --  12.4* 25.6*  --  20.8*  CREATININE  --  1.72*   < > 1.53*   < >  --  1.22  --   --  1.23 1.40* 2.18* 2.03* 2.37*  LATICACIDVEN 4.6* 2.8*  --  2.6*  --  3.4*  --  1.8  --   --   --   --   --   --   VANCOTROUGH  --   --   --   --   --   --   --   --   --   --   --  25*  --   --    < > = values in this interval not displayed.    Estimated Creatinine Clearance: 22.1 mL/min (A) (by C-G formula based on SCr of 2.37 mg/dL (H)).    Allergies  Allergen Reactions  . Sulfa Antibiotics Hives    Antimicrobials this admission: Vancomycin 12/4 x1, 12/8>>  Cefepime 12/4 >> 12/10 Doxy 12/6>>12/9 Meropenem 12/10>>  Microbiology results: 12/4 MRSA PCR - neg 12/4 BCx - ngtd 12/4 TA -  mod GPC+ GNR, abund WBC (reicnubated)>nf 12/5 TA - few nf 12/7  TA (BAL) - few normal flora  12/10 cdiff - neg  Thank you for allowing pharmacy to be a part of this patient's care.  FrErin HearingharmD., BCPS Clinical Pharmacist 10/21/2020 10:20 AM   Please check AMION for all [place] numbers After 10:00 PM, call [phone]

## 2020-10-21 NOTE — Progress Notes (Signed)
[place] Progress Note Patient Name: Dennis Carey DOB: 1935-01-22 MRN: [id number]   Date of Service  10/21/2020  HPI/Events of Note  Patient with episodic SVT, he is currently ib sinus tachycardia with rate of 113,  He has severe cardiomyopathy with systolic dysfunction ( EF 20-30 %). Mg+ and K+ are within acceptable limits.  eICU Interventions  Will check and ABG to r/o hypercapnia driving the ectopy, will also increase Fentanyl infusion rate as his blood pressure is elevated suggesting sub-optimal sedation as an alternative etiology.     Intervention Category Intermediate Interventions: Arrhythmia - evaluation and management  Frederik Pear 10/21/2020, 5:52 AM

## 2020-10-21 NOTE — Progress Notes (Signed)
  Amiodarone Drug - Drug Interaction Consult Note  Recommendations: -no changes recommended  Amiodarone is metabolized by the cytochrome P450 system and therefore has the potential to cause many drug interactions. Amiodarone has an average plasma half-life of 50 days (range 20 to 100 days).   There is potential for drug interactions to occur several weeks or months after stopping treatment and the onset of drug interactions may be slow after initiating amiodarone.   [x]  Statins: Increased risk of myopathy. Simvastatin- restrict dose to 20mg  daily. Other statins: counsel patients to report any muscle pain or weakness immediately.  []  Anticoagulants: Amiodarone can increase anticoagulant effect. Consider warfarin dose reduction. Patients should be monitored closely and the dose of anticoagulant altered accordingly, remembering that amiodarone levels take several weeks to stabilize.  []  Antiepileptics: Amiodarone can increase plasma concentration of phenytoin, the dose should be reduced. Note that small changes in phenytoin dose can result in large changes in levels. Monitor patient and counsel on signs of toxicity.  []  Beta blockers: increased risk of bradycardia, AV block and myocardial depression. Sotalol - avoid concomitant use.  []   Calcium channel blockers (diltiazem and verapamil): increased risk of bradycardia, AV block and myocardial depression.  []   Cyclosporine: Amiodarone increases levels of cyclosporine. Reduced dose of cyclosporine is recommended.  []  Digoxin dose should be halved when amiodarone is started.  []  Diuretics: increased risk of cardiotoxicity if hypokalemia occurs.  []  Oral hypoglycemic agents (glyburide, glipizide, glimepiride): increased risk of hypoglycemia. Patient's glucose levels should be monitored closely when initiating amiodarone therapy.   []  Drugs that prolong the QT interval:  Torsades de pointes risk may be increased with concurrent use - avoid if  possible.  Monitor QTc, also keep magnesium/potassium WNL if concurrent therapy can't be avoided. Marland Kitchen Antibiotics: e.g. fluoroquinolones, erythromycin. . Antiarrhythmics: e.g. quinidine, procainamide, disopyramide, sotalol. . Antipsychotics: e.g. phenothiazines, haloperidol.  . Lithium, tricyclic antidepressants, and methadone. Thank You,   Hildred Laser, PharmD Clinical Pharmacist **Pharmacist phone directory can now be found on [email or web address] ([email or web address]).  Listed under [place].   e

## 2020-10-21 NOTE — Progress Notes (Signed)
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 He had no operative complications and was discharged home. He returned to [facility] 10 days post-op due to fatigue, weakness, and poorly controlled back pain. He was also found to have sick contact and he was admit to neurosurgery service for pain control, IV hydration and possible dispo to CIR.   Past Medical History  CAD, CKD3, HTN, [place] [facility] Events   10/15/2020 Admisson 12/10 for hypoxic this morning, increased work of breathing.  Seem to respond nicely to titration of PEEP.  Changed sedation to propofol and fentanyl.  Broad-spectrum antibiotics continued.  Primary barriers to progress seem to be atelectasis, adynamic ileus, resolving pneumonia, and severe deconditioning all superimposed on his underlying stress cardiomyopathy  Consults:  Neurosurgery Cardiology  Procedures:  10/13/20 Intubation 12/5 bronch  Significant Diagnostic Tests:  10/11/20 Chest X-ray, minimal left basilar atelectasis 12/3 LE venous doppler: neg 12/4 echo: LVEF 25-30% 12/10 KUB: Consistent with colonic ileus-gas present down to rectum Micro Data:  10/15/2020 COVID, Flu neg 12/4:  MRSA PCR neg 12/4 Bronch tracheal aspirate - GNR, GPC growing 12/4 blood: ngtd  Antimicrobials:  Vanc>> Cefepime>> Doxycycline>>12/9 Interim history/subjective:  Fever has improved.  Remains unresponsive on mechanical ventilation, when sedation weaned patient becomes tachycardic.  Attempted diuresis today but patient developed tachycardia and relative hypotension  Objective   Blood pressure (!) 95/51, pulse (!) 139,  temperature (!) 101.4 F (38.6 C), temperature source Axillary, resp. rate 17, height 5\' 7"  (1.702 m), weight 72.3 kg, SpO2 97 %. CVP:  [10 mmHg-14 mmHg] 10 mmHg  Vent Mode: PRVC FiO2 (%):  [40 %] 40 % Set Rate:  [20 bmp] 20 bmp Vt Set:  [520 mL] 520 mL PEEP:  [5 [id number] cmH20] 8 cmH20 Plateau Pressure:  [28 cmH20] 28 cmH20   Intake/Output Summary (Last 24 hours) at 10/21/2020 1809 Last data filed at 10/21/2020 1700 Gross per 24 hour  Intake 2071.86 ml  Output 5650 ml  Net -3578.14 ml   Filed Weights   10/17/20 0500 10/20/20 0416 10/21/20 0500  Weight: 75.9 kg 76.4 kg 72.3 kg   General: This is an 85 year old white male he is currently sedated on propofol and fentanyl infusion  HEENT: ET tube, OG tube in place Pulmonary: Clear throughout Cardiac: Regular, tachycardic no murmur rub or gallop Abdomen: Less distended hyperactive bowel sounds Extremities mottling has improved, pulses palpable Neuro: No response to verbal stimulation GU: concentrated yellow urine  Resolved Hospital Problem list     Assessment & Plan:   L5-S1 DJD s/p laminectomy/decompression Acute hypoxic respiratory failure due to acute pulm edema and likely aspiration pneumonitis vs HCAP Hypoactive delirium Adynamic ileus 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 -Introduce enteral sedatives and wean  sedation as tolerated to permit SBT  Best practice (evaluated daily)   Diet: TF-->placed on hold for ileus   12/10 Pain/Anxiety/Delirium protocol (if indicated): Precedex changed to prop. Cont fentanyl.  Added Seroquel and Clonazepam enterally. VAP protocol (if indicated): ordered DVT prophylaxis: heparin twice daily GI prophylaxis: PPI Glucose control: see above Mobility: BR Code Status: Full Communication: Son updated at bedside 12/11 Disposition: ICU  CRITICAL CARE Performed by: Kipp Brood   Total critical care time: 40 minutes  Critical care time was exclusive of separately billable procedures and treating other patients.  Critical care was necessary to treat or prevent imminent or life-threatening deterioration.  Critical care was time spent personally by me on the following activities: development of treatment plan with patient and/or surrogate as well as nursing, discussions with consultants, evaluation of patient's response to treatment, examination of patient, obtaining history from patient or surrogate, ordering and performing treatments and interventions, ordering and review of laboratory studies, ordering and review of radiographic studies, pulse oximetry, re-evaluation of patient's condition and participation in multidisciplinary rounds.  Kipp Brood, MD [facility] ICU Physician [place]  Pager: [phone] Mobile: [phone] After hours: [phone].

## 2020-10-21 NOTE — Progress Notes (Signed)
Neurosurgery Service Progress Note  Subjective: No acute events overnight  Objective: Vitals:   10/21/20 1100 10/21/20 1127 10/21/20 1200 10/21/20 1210  BP: 135/60  (!) 121/57   Pulse: (!) 123  (!) 120   Resp: (!) 24  (!) 25   Temp:    (!) 101.6 F (38.7 C)  TempSrc:    Axillary  SpO2: 96% 96% 97%   Weight:      Height:        Physical Exam: Intubated and sedated  Assessment & Plan: Cr uptrending, episodic SVT, but PEEP down to +5 today, otherwise stable, no change in neurosurgical plan of care  DARUIS SWAIM  10/21/20 2:31 PM

## 2020-10-21 NOTE — Progress Notes (Signed)
Patient ID: Dennis Carey, male   DOB: Sep 26, 1935, 85 y.o.   MRN: [id number]     Advanced Heart Failure Rounding Note   Subjective:   12/7 Bronch with copious secretions that were removed.  12/8 Extubated but required re-intubation.   Continues on vent, stable settings. FiO2 40%.   Sputum cultures w/ normal respiratory flora. No staph aureus or pseudomonas growing.  Now afebrile. WBC 9.9>>12.4>>25.6>>20.8. Now on meropenem.   Currently on NE 6.  With increasing sedation, BP drops and with decreasing sedation, HR rises.  Currently sinus tachy about 120.  CVP 10 today.  No co-ox yet.   Creatinine up to 2.37 today, still with good UOP.  MAP 80s.    Objective:   Weight Range:  Vital Signs:   Temp:  [98.1 F (36.7 C)-101.8 F (38.8 C)] 99.7 F (37.6 C) (12/12 0747) Pulse Rate:  [96-122] 122 (12/12 1000) Resp:  [14-29] 24 (12/12 1000) BP: (91-147)/(43-64) 116/55 (12/12 1000) SpO2:  [94 %-100 %] 96 % (12/12 1000) Arterial Line BP: (107-210)/(24-58) 120/43 (12/12 1000) FiO2 (%):  [40 %] 40 % (12/12 0755) Weight:  [72.3 kg] 72.3 kg (12/12 0500) Last BM Date: 10/21/20  Weight change: Filed Weights   10/17/20 0500 10/20/20 0416 10/21/20 0500  Weight: 75.9 kg 76.4 kg 72.3 kg    Intake/Output:   Intake/Output Summary (Last 24 hours) at 10/21/2020 1038 Last data filed at 10/21/2020 0900 Gross per 24 hour  Intake 1771.27 ml  Output 3450 ml  Net -1678.73 ml     Physical Exam: CVP 10 General: Sedated on vent Neck: JVP 10 cm, no thyromegaly or thyroid nodule.  Lungs: Crackles at bases. CV: Nondisplaced PMI.  Heart mildly tachy, regular S1/S2, no S3/S4, no murmur.  No peripheral edema.   Abdomen: Soft, nontender, no hepatosplenomegaly, mild distention.  Skin: Intact without lesions or rashes.  Neurologic: Sedated on vent.  Extremities: No clubbing or cyanosis.  HEENT: Normal.   Telemetry: NSR 80s  Labs: Basic Metabolic Panel: Recent Labs  Lab 10/30/2020 0547  10/20/2020 1114 10/17/20 2000 10/18/20 0404 10/18/20 0734 10/19/20 0324 10/20/20 0233 10/20/20 1433 10/21/20 0500 10/21/20 0612  NA 140   < >  --   --  141 141 139 138 142 144  K 3.5   < >  --   --  4.2 3.9 4.7 4.1 4.1 3.9  CL 104   < >  --   --  110 108 109 108 112*  --   CO2 25   < >  --   --  21* 22 19* 19* 18*  --   GLUCOSE 252*   < >  --   --  195* 134* 235* 117* 138*  --   BUN 47*   < >  --   --  30* 32* 52* 60* 71*  --   CREATININE 1.53*   < >  --   --  1.23 1.40* 2.18* 2.03* 2.37*  --   CALCIUM 7.4*   < >  --   --  7.4* 7.5* 7.7* 8.3* 7.8*  --   MG 1.9  --   --   --   --  2.0 2.2  --  2.3  --   PHOS 2.7   < > 3.5 2.5  --  3.8 5.9*  --  5.5*  --    < > = values in this interval not displayed.    Liver Function Tests: Recent Labs  Lab 10/14/20  1550 11/02/2020 0547  AST 42* 43*  ALT 36 40  ALKPHOS 60 58  BILITOT 1.4* 1.1  PROT 5.0* 5.0*  ALBUMIN 2.2* 1.9*   No results for input(s): LIPASE, AMYLASE in the last 168 hours. No results for input(s): AMMONIA in the last 168 hours.  CBC: Recent Labs  Lab 10/17/20 0308 10/17/20 1548 10/18/20 0404 10/19/20 0324 10/20/20 0233 10/21/20 0500 10/21/20 0612  WBC 9.4  --  9.9 12.4* 25.6* 20.8*  --   HGB 7.7*   < > 8.0* 8.1* 8.4* 8.0* 7.1*  HCT 25.3*   < > 25.9* 25.4* 26.7* 26.3* 21.0*  MCV 95.5  --  95.2 94.1 97.8 95.6  --   PLT 192  --  207 223 240 243  --    < > = values in this interval not displayed.    Cardiac Enzymes: No results for input(s): CKTOTAL, CKMB, CKMBINDEX, TROPONINI in the last 168 hours.  BNP: BNP (last 3 results) Recent Labs    10/13/20 1513  BNP 589.0*    ProBNP (last 3 results) No results for input(s): PROBNP in the last 8760 hours.    Other results:  Imaging: VAS [place] LOWER EXTREMITY VENOUS (DVT)  Result Date: 10/19/2020  Lower Venous DVT Study Indications: Swelling.  Limitations: Bandages. Comparison Study: 10/12/20 previous Performing Technologist: Abram Sander RVS  Examination  Guidelines: A complete evaluation includes B-mode imaging, spectral Doppler, color Doppler, and power Doppler as needed of all accessible portions of each vessel. Bilateral testing is considered an integral part of a complete examination. Limited examinations for reoccurring indications may be performed as noted. The reflux portion of the exam is performed with the patient in reverse Trendelenburg.  +---------+---------------+---------+-----------+----------+--------------+ RIGHT    CompressibilityPhasicitySpontaneityPropertiesThrombus Aging +---------+---------------+---------+-----------+----------+--------------+ CFV                                                   bandages       +---------+---------------+---------+-----------+----------+--------------+ SFJ                                                   bandages       +---------+---------------+---------+-----------+----------+--------------+ FV Prox  Full                                                        +---------+---------------+---------+-----------+----------+--------------+ FV Mid   Full                                                        +---------+---------------+---------+-----------+----------+--------------+ FV DistalFull                                                        +---------+---------------+---------+-----------+----------+--------------+ PFV  Full                                                        +---------+---------------+---------+-----------+----------+--------------+ POP      Full           Yes      Yes                                 +---------+---------------+---------+-----------+----------+--------------+ PTV      Full                                                        +---------+---------------+---------+-----------+----------+--------------+ PERO     Full                                                         +---------+---------------+---------+-----------+----------+--------------+   +---------+---------------+---------+-----------+----------+--------------+ LEFT     CompressibilityPhasicitySpontaneityPropertiesThrombus Aging +---------+---------------+---------+-----------+----------+--------------+ CFV      Full           Yes      Yes                                 +---------+---------------+---------+-----------+----------+--------------+ SFJ      Full                                                        +---------+---------------+---------+-----------+----------+--------------+ FV Prox  Full                                                        +---------+---------------+---------+-----------+----------+--------------+ FV Mid   Full                                                        +---------+---------------+---------+-----------+----------+--------------+ FV DistalFull                                                        +---------+---------------+---------+-----------+----------+--------------+ PFV      Full                                                        +---------+---------------+---------+-----------+----------+--------------+  POP      Full           Yes      Yes                                 +---------+---------------+---------+-----------+----------+--------------+ PTV      Full                                                        +---------+---------------+---------+-----------+----------+--------------+ PERO     Full                                                        +---------+---------------+---------+-----------+----------+--------------+     Summary: BILATERAL: - No evidence of deep vein thrombosis seen in the lower extremities, bilaterally. - No evidence of superficial venous thrombosis in the lower extremities, bilaterally. -No evidence of popliteal cyst, bilaterally.   *See table(s) above for measurements  and observations.    Preliminary    [place] EKG SITE RITE  Result Date: 10/19/2020 If Site Rite image not attached, placement could not be confirmed due to current cardiac rhythm.    Medications:     Scheduled Medications: . aspirin  81 mg Per Tube Daily  . chlorhexidine gluconate (MEDLINE KIT)  15 mL Mouth Rinse BID  . Chlorhexidine Gluconate Cloth  6 each Topical Daily  . clonazepam  0.25 mg Per Tube BID  . ezetimibe  10 mg Per Tube Daily  . feeding supplement (PROSource TF)  90 mL Per Tube BID  . fentaNYL (SUBLIMAZE) injection  25 mcg Intravenous Once  . furosemide  80 mg Intravenous Once  . heparin injection (subcutaneous)  5,000 Units Subcutaneous Q8H  . insulin aspart  0-15 Units Subcutaneous Q4H  . insulin aspart  3 Units Subcutaneous Q4H  . insulin detemir  15 Units Subcutaneous BID  . ipratropium-albuterol  3 mL Nebulization Q6H  . mouth rinse  15 mL Mouth Rinse 10 times per day  . metoCLOPramide (REGLAN) injection  5 mg Intravenous Q8H  . pantoprazole sodium  40 mg Per Tube Q1200  . pregabalin  75 mg Per Tube BID  . QUEtiapine  50 mg Per Tube BID  . rosuvastatin  20 mg Per Tube Daily  . sodium chloride flush  10-40 mL Intracatheter Q12H  . sodium chloride flush  10-40 mL Intracatheter Q12H  . sodium chloride flush  3 mL Intravenous Q12H  . sodium chloride HYPERTONIC  4 mL Nebulization BID    Infusions: . sodium chloride    . sodium chloride Stopped (10/18/20 1958)  . feeding supplement (VITAL 1.5 CAL) 30 mL/hr at 10/21/20 0900  . fentaNYL infusion INTRAVENOUS 100 mcg/hr (10/21/20 0900)  . meropenem (MERREM) IV Stopped (10/21/20 0839)  . norepinephrine (LEVOPHED) Adult infusion 6 mcg/min (10/21/20 0900)  . propofol (DIPRIVAN) infusion 10 mcg/kg/min (10/21/20 0900)  . vasopressin Stopped (10/20/20 1051)    PRN Medications: Place/Maintain arterial line **AND** sodium chloride, sodium chloride, acetaminophen (TYLENOL) oral liquid 160 mg/5 mL, fentaNYL, midazolam,  ondansetron **OR** ondansetron (ZOFRAN) IV, sodium chloride flush, sodium chloride flush   Assessment/Plan:  1. Acute Hypoxemic Respiratory Failure - due to PNA and HF - Developed acute respiratory failure on 12/4 and required intubation.  - CXR with pulmonary edema + infiltrates on 12/4.  - PCT 109 -> 84->43  - Developed mucous plugging 12/6 with severe respiratory distress and hypotension. Resolved with deep suctioning. Bronch with diffuse secretions.  - Extubated 12/8 but required re intubation.  - Sputum cultures w/ normal respiratory flora. No staph aureus or pseudomonas growing - On meropenem now, WBCs high but trending down and afebrile (20.8).   - vent management per PCCM   2. Shock - likely sepsis/cardiogenic combined. Good co-ox yesterday, pending today.  - Echo- reduced EF with EF 25-30% and WMA. RV normal.   - Pressor requirement up and down with sedation, currently on NE 6.  - Add low dose milrinone if co-ox is low on check today.  - Creatinine up to 2.37, CVP 10.  Ordered for 1 dose Lasix 80 mg IV by CCM, would only give the one dose.  - On antimicrobial coverage for HCAP- expanded 12/8.  - Blood CX- NGTD   - Sputum Cx-  w/ normal respiratory flora. No staph aureus or pseudomonas growing  3. Acute Systolic Heart Failure  - As above Echo with EF 25-30% . No previous ECHO. No previous cath.  - Hs Trop [phone]. - RHC 12/6 with low filling pressures after diuresis and low index 1.9. SVR 1467 - Unable to add GDMT due to shock.  - CVP 10, 1 dose of Lasix today.  Need to be careful with rising creatinine.  - On NE 6 currently, able to wean as sedation lightened but gets agitated.  CCM adjusting sedation.   - Send co-ox today, can use low dose milrinone if needed.  - Suspect combination of septic and cardiogenic shock. Troponin moderately elevated but flat. Possibility of Tako-tsubo/septic CM has been raised and seems to fit clinically but cannot rule out CAD.  If he  recovers and creatinine comes down, needs coronary angiography.   4. Back Pain--> S/P L5-S1 laminectomy with fusion 09/28/2020  Per Neurosugery  5. AKI Creatinine worsening today with stable MAP, pending co-ox.    6. Ileus - Improved, has had BM though still distended.    Length of Stay: [place] Performed by: Loralie Champagne  Total critical care time: 35 minutes  Critical care time was exclusive of separately billable procedures and treating other patients.  Critical care was necessary to treat or prevent imminent or life-threatening deterioration.  Critical care was time spent personally by me (independent of midlevel providers or residents) on the following activities: development of treatment plan with patient and/or surrogate as well as nursing, discussions with consultants, evaluation of patient's response to treatment, examination of patient, obtaining history from patient or surrogate, ordering and performing treatments and interventions, ordering and review of laboratory studies, ordering and review of radiographic studies, pulse oximetry and re-evaluation of patient's condition.   Loralie Champagne, MD  10:38 AM

## 2020-10-22 ENCOUNTER — Inpatient Hospital Stay (HOSPITAL_COMMUNITY): Payer: Medicare Other

## 2020-10-22 DIAGNOSIS — J9601 Acute respiratory failure with hypoxia: Secondary | ICD-10-CM

## 2020-10-22 DIAGNOSIS — R140 Abdominal distension (gaseous): Secondary | ICD-10-CM

## 2020-10-22 DIAGNOSIS — K567 Ileus, unspecified: Secondary | ICD-10-CM

## 2020-10-22 DIAGNOSIS — I509 Heart failure, unspecified: Secondary | ICD-10-CM

## 2020-10-22 DIAGNOSIS — Z978 Presence of other specified devices: Secondary | ICD-10-CM

## 2020-10-22 DIAGNOSIS — N179 Acute kidney failure, unspecified: Secondary | ICD-10-CM

## 2020-10-22 LAB — BASIC METABOLIC PANEL
Anion gap: 15 (ref 5–15)
BUN: 84 mg/dL — ABNORMAL HIGH (ref 8–23)
CO2: 20 mmol/L — ABNORMAL LOW (ref 22–32)
Calcium: 7.5 mg/dL — ABNORMAL LOW (ref 8.9–10.3)
Chloride: 113 mmol/L — ABNORMAL HIGH (ref 98–111)
Creatinine, Ser: 2.75 mg/dL — ABNORMAL HIGH (ref 0.61–1.24)
GFR, Estimated: 22 mL/min — ABNORMAL LOW (ref >60)
Glucose, Bld: 212 mg/dL — ABNORMAL HIGH (ref 70–99)
Potassium: 4.4 mmol/L (ref 3.5–5.1)
Sodium: 148 mmol/L — ABNORMAL HIGH (ref 135–145)

## 2020-10-22 LAB — CBC
HCT: 24.5 % — ABNORMAL LOW (ref 39.0–52.0)
Hemoglobin: 7.6 g/dL — ABNORMAL LOW (ref 13.0–17.0)
MCH: 29.2 pg (ref 26.0–34.0)
MCHC: 31 g/dL (ref 30.0–36.0)
MCV: 94.2 fL (ref 80.0–100.0)
Platelets: 254 10*3/uL (ref 150–400)
RBC: 2.6 MIL/uL — ABNORMAL LOW (ref 4.22–5.81)
RDW: 13.9 % (ref 11.5–15.5)
WBC: 17.4 10*3/uL — ABNORMAL HIGH (ref 4.0–10.5)
nRBC: 0.1 % (ref 0.0–0.2)

## 2020-10-22 LAB — COOXEMETRY PANEL
Carboxyhemoglobin: 1.2 % (ref 0.5–1.5)
Methemoglobin: 0.9 % (ref 0.0–1.5)
O2 Saturation: 79 %
Total hemoglobin: 7.2 g/dL — ABNORMAL LOW (ref 12.0–16.0)

## 2020-10-22 LAB — MAGNESIUM: Magnesium: 2.3 mg/dL (ref 1.7–2.4)

## 2020-10-22 LAB — GLUCOSE, CAPILLARY
Glucose-Capillary: 102 mg/dL — ABNORMAL HIGH (ref 70–99)
Glucose-Capillary: 148 mg/dL — ABNORMAL HIGH (ref 70–99)
Glucose-Capillary: 156 mg/dL — ABNORMAL HIGH (ref 70–99)
Glucose-Capillary: 159 mg/dL — ABNORMAL HIGH (ref 70–99)
Glucose-Capillary: 163 mg/dL — ABNORMAL HIGH (ref 70–99)
Glucose-Capillary: 166 mg/dL — ABNORMAL HIGH (ref 70–99)
Glucose-Capillary: 186 mg/dL — ABNORMAL HIGH (ref 70–99)
Glucose-Capillary: 186 mg/dL — ABNORMAL HIGH (ref 70–99)
Glucose-Capillary: 190 mg/dL — ABNORMAL HIGH (ref 70–99)
Glucose-Capillary: 203 mg/dL — ABNORMAL HIGH (ref 70–99)
Glucose-Capillary: 299 mg/dL — ABNORMAL HIGH (ref 70–99)

## 2020-10-22 LAB — PROCALCITONIN: Procalcitonin: 18.21 ng/mL

## 2020-10-22 LAB — PHOSPHORUS: Phosphorus: 6.4 mg/dL — ABNORMAL HIGH (ref 2.5–4.6)

## 2020-10-22 LAB — TRIGLYCERIDES: Triglycerides: 64 mg/dL (ref <150)

## 2020-10-22 MED ORDER — ALBUMIN HUMAN 25 % IV SOLN
25.0000 g | Freq: Four times a day (QID) | INTRAVENOUS | Status: DC
  Administered 2020-10-22 – 2020-10-23 (×2): 25 g via INTRAVENOUS
  Filled 2020-10-22 (×3): qty 100

## 2020-10-22 MED ORDER — MIDAZOLAM HCL 2 MG/2ML IJ SOLN
1.0000 mg | INTRAMUSCULAR | Status: DC | PRN
Start: 2020-10-22 — End: 2020-10-23

## 2020-10-22 MED ORDER — BETHANECHOL CHLORIDE 10 MG PO TABS
10.0000 mg | ORAL_TABLET | Freq: Three times a day (TID) | ORAL | Status: DC
  Administered 2020-10-22 – 2020-10-30 (×26): 10 mg
  Filled 2020-10-22 (×26): qty 1

## 2020-10-22 NOTE — Progress Notes (Signed)
NAME:  Dennis Carey, MRN:  [id number], DOB:  1935-09-07, LOS: 30 ADMISSION DATE:  10/11/2020, CONSULTATION DATE:  10/13/20 REFERRING MD:  Dennis Carey, CHIEF COMPLAINT:  Respiratory distress   Brief History   Dennis Carey is 85 yo M w/ PMH of L5-S1 decompression surgery admit for pnumonia  History of present illness   Dennis Carey was recently admitted on 09/28/20 for severe R- sided L5-S1 decompressive laminotomy. He had no operative complications and was discharged home. He returned to [facility] 10 days post-op due to fatigue, weakness, and poorly controlled back pain. He was also found to have sick contact and he was admit to neurosurgery service for pain control, IV hydration and possible dispo to CIR.   Past Medical History  CAD, CKD3, HTN, [facility] Events   11/04/2020 Admisson 12/10 intubated for hypoxia this morning, increased work of breathing.  Seem to respond nicely to titration of PEEP.  Changed sedation to propofol and fentanyl.  Broad-spectrum antibiotics continued.  Primary barriers to progress seem to be atelectasis, adynamic ileus, resolving pneumonia, and severe deconditioning all superimposed on his underlying stress cardiomyopathy   Consults:  Neurosurgery Cardiology  Procedures:  10/13/20 Intubation 12/5 bronch  Significant Diagnostic Tests:  10/11/20 Chest X-ray, minimal left basilar atelectasis 12/3 LE venous doppler: neg 12/4 echo: LVEF 25-30% 12/10 KUB: Consistent with colonic ileus-gas present down to rectum  Micro Data:  11/02/2020 COVID, Flu neg 12/4:  MRSA PCR neg 12/4 Bronch tracheal aspirate - GNR, GPC growing 12/4 blood: ngtd  Antimicrobials:  Vanc 12/4 > 12/13 Cefepime 12/4 > 12/10 Doxycycline 12/6 > 2/9 Merrem 12/10 >   Interim history/subjective:  Fever improved to 100.4 Attempted some diuresis 12/12 but had relative hypotension and A.flutter. Mental status remains flat despite sedation off for past 4 - 5 hours.  Had been started on seroquel at  50mg  BID 12/11 and clonazepam 0.25mg  BID 12/12.  Objective   Blood pressure (!) 134/59, pulse (!) 102, temperature (!) 100.4 F (38 C), temperature source Axillary, resp. rate (!) 22, height 5\' 7"  (1.702 m), weight 69.7 kg, SpO2 97 %. CVP:  [4 mmHg-10 mmHg] 6 mmHg  Vent Mode: PRVC FiO2 (%):  [40 %] 40 % Set Rate:  [20 bmp] 20 bmp Vt Set:  [520 mL] 520 mL PEEP:  [5 cmH20-8 cmH20] 8 cmH20 Plateau Pressure:  [18 cmH20-25 cmH20] 25 cmH20   Intake/Output Summary (Last 24 hours) at 10/22/2020 0933 Last data filed at 10/22/2020 0900 Gross per 24 hour  Intake 3121.16 ml  Output 5525 ml  Net -2403.84 ml   Filed Weights   10/20/20 0416 10/21/20 0500 10/22/20 0500  Weight: 76.4 kg 72.3 kg 69.7 kg   Physical Exam: General: Elderly male, resting in bed, in NAD. Neuro: Sedated, not following commands. HEENT: [place]/AT. Sclerae anicteric. ETT in place. Cardiovascular: RRR, no M/R/G.  Lungs: Respirations even and unlabored.  CTA bilaterally, No W/R/R. Abdomen: BS x 4, soft, NT/ND.  Musculoskeletal: No gross deformities, no edema.  Skin: Intact, warm, no rashes.  Assessment & Plan:   L5-S1 DJD s/p laminectomy/decompression - Per neurosurgery.  Acute hypoxic respiratory failure due to acute pulm edema and likely aspiration pneumonitis vs HCAP - Continue vent support. - Weaning / SBT has been a challenge due to sedation needs and tachycardia. - Continue to attempt weaning. - Stop vanc (10 days total). - Continue merrem x 7 days total (stop date 12/16, felt to have improved after starting, possibly 2/2 better coverage of gut translocation). -  Follow CXR.  Acute encephalopathy - remains unresponsive despite propofol off for 4 - 5 hours.  Complicated by slowed med clearance in setting AKI. Hypoactive delirium. - Hold sedation as able. - D/c seroquel and clonazepam (had been started on seroquel at 50mg  BID 12/11 and clonazepam 0.25mg  BID 12/12). - If no improvement by tomorrow, consider  EEG.  Adynamic ileus - Continue Reglan, stop all other laxatives.  A.flutter with RVR after diuresis - improved with amio. 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 - Continue amio.  AKI due to ATN. - Continue supportive care. - Assess renal US. - Follow BMP and UOP.  Type 2 diabetes. - SSI, levemir.  Anemia of acute illness. - Transfuse for Hgb < 7.  Generalized deconditioning. - PT / OT when able.   Best practice (evaluated daily)  Diet: TF Pain/Anxiety/Delirium protocol (if indicated): Hold all sedation as able (propofol gtt).  D/c Seroquel and Clonazepam for now. VAP protocol (if indicated): ordered DVT prophylaxis: heparin twice daily GI prophylaxis: PPI Glucose control: SSI, levemir Mobility: BR Code Status: Full Communication: Son updated at bedside 12/13 Disposition: ICU   CC time: 35 min.   Montey Hora, [facility] 10/22/2020, 10:06 AM

## 2020-10-22 NOTE — Progress Notes (Signed)
Dr. Eleanora Neighbor Foley was D/C'd according to policy with typical patients at around 11:00 this morning.  During the ultrasound of his kidneys and bladder, the technologist advised that his bladder was "full" (at 14:00).  Another nurse, overhearing the conversation, informed me that pt's Foley had been placed Saturday d/t significant urinary retention and the need for multiple in and out catheterizations.  Dr. Tacy Learn was notified of pt's Hx w/catheterizations and he advised to place a Foley catheter.    Thayer Headings, RN placed catheter using sterile technique per hospital policy and 115 mL of urine were immediately returned.

## 2020-10-22 NOTE — Progress Notes (Addendum)
Patient ID: Dennis Carey, male   DOB: 09-18-1935, 85 y.o.   MRN: [id number]     Advanced Heart Failure Rounding Note   Subjective:   12/7 Bronch with copious secretions that were removed.  12/8 Extubated but required re-intubation.   Continues on vent, stable settings. FiO2 40%.   Sputum cultures w/ normal respiratory flora. No staph aureus or pseudomonas growing. Now on meropenem. WBC improving, 25.6>>20.8>>17.4 but febrile overnight, mTemp 102.1. C-diff negative.   Currently on NE 12.  With increasing sedation, BP drops and with decreasing sedation, HR rises.  Co-ox 79% today.   SCr continues to rise, 2.03>>2.37>>2.75.  BUN 60>>71>>84  CVP 6. Off IV Lasix   Na 148   Hgb 7.6   Developed Afib w/ RVR yesterday. Started on amio gtt. Now sinus tach.   Son, Gershon Mussel, present at bedside.   Objective:   Weight Range:  Vital Signs:   Temp:  [98.4 F (36.9 C)-102.1 F (38.9 C)] 100.4 F (38 C) (12/13 0739) Pulse Rate:  [93-157] 94 (12/13 0751) Resp:  [16-27] 27 (12/13 0751) BP: (95-135)/(38-63) 118/55 (12/13 0700) SpO2:  [92 %-100 %] 97 % (12/13 0751) Arterial Line BP: (70-173)/(30-70) 115/37 (12/13 0700) FiO2 (%):  [40 %] 40 % (12/13 0751) Weight:  [69.7 kg] 69.7 kg (12/13 0500) Last BM Date: 10/21/20  Weight change: Filed Weights   10/20/20 0416 10/21/20 0500 10/22/20 0500  Weight: 76.4 kg 72.3 kg 69.7 kg    Intake/Output:   Intake/Output Summary (Last 24 hours) at 10/22/2020 0821 Last data filed at 10/22/2020 0700 Gross per 24 hour  Intake 3072.29 ml  Output 5300 ml  Net -2227.71 ml     PHYSICAL EXAM: CVP 6 General: intubated and sedated, mildly diaphoretic.  HEENT: + ETT Neck: supple. no JVD. Carotids 2+ bilat; no bruits. No lymphadenopathy or thyromegaly appreciated. Cor: PMI nondisplaced. Regular rhythm, tachy rate. No rubs, gallops or murmurs. Lungs: intubated, course BS bilaterally  Abdomen: soft, nontender, nondistended. No hepatosplenomegaly. No  bruits or masses. Good bowel sounds. Extremities: no cyanosis, clubbing, rash, edema Neuro: intubated and sedated    Telemetry: sinus tach 110s   Labs: Basic Metabolic Panel: Recent Labs  Lab 10/18/20 0404 10/18/20 0734 10/19/20 0324 10/20/20 0233 10/20/20 1433 10/21/20 0500 10/21/20 0612 10/22/20 0641  NA  --    < > 141 139 138 142 144 148*  K  --    < > 3.9 4.7 4.1 4.1 3.9 4.4  CL  --    < > 108 109 108 112*  --  113*  CO2  --    < > 22 19* 19* 18*  --  20*  GLUCOSE  --    < > 134* 235* 117* 138*  --  212*  BUN  --    < > 32* 52* 60* 71*  --  84*  CREATININE  --    < > 1.40* 2.18* 2.03* 2.37*  --  2.75*  CALCIUM  --    < > 7.5* 7.7* 8.3* 7.8*  --  7.5*  MG  --   --  2.0 2.2  --  2.3  --  2.3  PHOS 2.5  --  3.8 5.9*  --  5.5*  --  6.4*   < > = values in this interval not displayed.    Liver Function Tests: No results for input(s): AST, ALT, ALKPHOS, BILITOT, PROT, ALBUMIN in the last 168 hours. No results for input(s): LIPASE, AMYLASE in the last  168 hours. No results for input(s): AMMONIA in the last 168 hours.  CBC: Recent Labs  Lab 10/18/20 0404 10/19/20 0324 10/20/20 0233 10/21/20 0500 10/21/20 0612 10/22/20 0641  WBC 9.9 12.4* 25.6* 20.8*  --  17.4*  HGB 8.0* 8.1* 8.4* 8.0* 7.1* 7.6*  HCT 25.9* 25.4* 26.7* 26.3* 21.0* 24.5*  MCV 95.2 94.1 97.8 95.6  --  94.2  PLT 207 223 240 243  --  254    Cardiac Enzymes: No results for input(s): CKTOTAL, CKMB, CKMBINDEX, TROPONINI in the last 168 hours.  BNP: BNP (last 3 results) Recent Labs    10/13/20 1513  BNP 589.0*    ProBNP (last 3 results) No results for input(s): PROBNP in the last 8760 hours.    Other results:  Imaging: DG CHEST PORT 1 VIEW  Result Date: 10/21/2020 CLINICAL DATA:  CHF EXAM: PORTABLE CHEST 1 VIEW COMPARISON:  10/19/2020 FINDINGS: Slight interval improvement in diffuse bilateral interstitial pulmonary opacity, particularly the upper lungs. Interval removal of previously noted  right-sided pulmonary artery catheter. Support apparatus otherwise remains unchanged, with endotracheal tube over the lower trachea esophagogastric tube tip and side port below the diaphragm. IMPRESSION: 1. Slight interval improvement in diffuse bilateral interstitial pulmonary opacity, particularly the upper lungs, consistent with improved edema. 2. Interval removal of previously noted right-sided pulmonary artery catheter. Other support apparatus unchanged. Electronically Signed   By: Eddie Candle M.D.   On: 10/21/2020 13:28     Medications:     Scheduled Medications: . aspirin  81 mg Per Tube Daily  . chlorhexidine gluconate (MEDLINE KIT)  15 mL Mouth Rinse BID  . Chlorhexidine Gluconate Cloth  6 each Topical Daily  . clonazepam  0.25 mg Per Tube BID  . ezetimibe  10 mg Per Tube Daily  . feeding supplement (PROSource TF)  90 mL Per Tube BID  . fentaNYL (SUBLIMAZE) injection  25 mcg Intravenous Once  . heparin injection (subcutaneous)  5,000 Units Subcutaneous Q8H  . insulin aspart  0-15 Units Subcutaneous Q4H  . insulin aspart  3 Units Subcutaneous Q4H  . insulin detemir  15 Units Subcutaneous BID  . ipratropium-albuterol  3 mL Nebulization Q6H  . mouth rinse  15 mL Mouth Rinse 10 times per day  . metoCLOPramide (REGLAN) injection  5 mg Intravenous Q8H  . pantoprazole sodium  40 mg Per Tube Q1200  . pregabalin  75 mg Per Tube BID  . QUEtiapine  50 mg Per Tube BID  . rosuvastatin  20 mg Per Tube Daily  . sodium chloride flush  10-40 mL Intracatheter Q12H  . sodium chloride flush  10-40 mL Intracatheter Q12H  . sodium chloride flush  3 mL Intravenous Q12H    Infusions: . sodium chloride    . sodium chloride Stopped (10/18/20 1958)  . amiodarone 30 mg/hr (10/22/20 0700)  . feeding supplement (VITAL 1.5 CAL) 45 mL/hr at 10/22/20 0700  . fentaNYL infusion INTRAVENOUS Stopped (10/22/20 0641)  . meropenem (MERREM) IV Stopped (10/21/20 2054)  . norepinephrine (LEVOPHED) Adult  infusion 15 mcg/min (10/22/20 0700)  . propofol (DIPRIVAN) infusion Stopped (10/22/20 0641)  . vancomycin Stopped (10/21/20 1839)  . vasopressin Stopped (10/20/20 1051)    PRN Medications: Place/Maintain arterial line **AND** sodium chloride, sodium chloride, acetaminophen (TYLENOL) oral liquid 160 mg/5 mL, fentaNYL, midazolam, ondansetron **OR** ondansetron (ZOFRAN) IV, sodium chloride flush, sodium chloride flush   Assessment/Plan:   1. Acute Hypoxemic Respiratory Failure - due to PNA and HF - Developed acute respiratory failure on  12/4 and required intubation.  - CXR with pulmonary edema + infiltrates on 12/4.  - PCT 109 -> 84->43  - Developed mucous plugging 12/6 with severe respiratory distress and hypotension. Resolved with deep suctioning. Bronch with diffuse secretions.  - Extubated 12/8 but required re intubation.  - Sputum cultures w/ normal respiratory flora. No staph aureus or pseudomonas growing - On meropenem now, WBCs trending down 25.6>>20.8>>17.4 but febrile overnight, mTemp 102.1 - continue meropenem  - vent management per PCCM   2. Shock - likely sepsis/cardiogenic combined. Good co-ox yesterday, pending today.  - Echo- reduced EF with EF 25-30% and WMA. RV normal.   - Pressor requirement up and down with sedation, currently on NE 12  - Co-ox stable at 79% - Creatinine up to 2.75, CVP 6.  Hold diuretics - On antimicrobial coverage for HCAP- expanded 12/8.  - Blood CX- NGTD   - Sputum Cx-  w/ normal respiratory flora. No staph aureus or pseudomonas growing - C-diff negative   3. Acute Systolic Heart Failure  - As above Echo with EF 25-30% . No previous ECHO. No previous cath.  - Hs Trop [phone]. - RHC 12/6 with low filling pressures after diuresis and low index 1.9. SVR 1467 - Unable to add GDMT due to shock.  - Co-ox 79% - CVP 6. SCr/ BUN elevated. Hold diuretics today  - Suspect combination of septic and cardiogenic shock. Troponin moderately  elevated but flat. Possibility of Tako-tsubo/septic CM has been raised and seems to fit clinically but cannot rule out CAD.  If he recovers and creatinine comes down, needs coronary angiography.   4. Back Pain--> S/P L5-S1 laminectomy with fusion 09/28/2020  Per Neurosugery  5. AKI - Creatinine/BUN worse today with stable MAP and co-ox - SCr  2.03>>2.37>>2.75.  - suspect over diuresis, CVP 6 - hold diuretics, follow BMP   6. Ileus - Improved, has had BM though still distended.    7. Hypernatremia - Na 148 - free water boluses   8. Anemia - Hgb 7.6 - monitor, transfuse for hgb <7.0   Length of Stay: 12  Brittainy Simmons, PA-C  8:21 AM  Agree with above.  He remains very tenuous. Intubated/sedated. On NE 15 co-ox 79%. CVP ok. Renal function continues to deteriorate. CXR looking slightly better and much less secretions on exam (I personally did deep suction via ETT). Ileus improving. No further AF on IV amio.   General:  Intubated sedated HEENT: normal + ETT Neck: supple. no JVD. Carotids 2+ bilat; no bruits. No lymphadenopathy or thryomegaly appreciated. Cor: PMI nondisplaced. Regular rate & rhythm. No rubs, gallops or murmurs. Lungs: coarse Abdomen: soft, nontender, nondistended. No hepatosplenomegaly. No bruits or masses. Good bowel sounds. Extremities: no cyanosis, clubbing, rash, tr edema Neuro: intubated sedated.   Remains critically ill. Cardiac output stable on NE. Volume status ok. PNA seems to be improving. Hopefully renal function turns the corner soon. Continue aggressive care. D/w CCM and son at bedside.   CRITICAL CARE Performed by: Glori Bickers  Total critical care time: 45 minutes  Critical care time was exclusive of separately billable procedures and treating other patients.  Critical care was necessary to treat or prevent imminent or life-threatening deterioration.  Critical care was time spent personally by me (independent of midlevel  providers or residents) on the following activities: development of treatment plan with patient and/or surrogate as well as nursing, discussions with consultants, evaluation of patient's response to treatment, examination of patient, obtaining history from  patient or surrogate, ordering and performing treatments and interventions, ordering and review of laboratory studies, ordering and review of radiographic studies, pulse oximetry and re-evaluation of patient's condition.  Glori Bickers, MD  1:19 PM

## 2020-10-23 DIAGNOSIS — R0902 Hypoxemia: Secondary | ICD-10-CM

## 2020-10-23 DIAGNOSIS — G9340 Encephalopathy, unspecified: Secondary | ICD-10-CM

## 2020-10-23 LAB — CBC
HCT: 23.8 % — ABNORMAL LOW (ref 39.0–52.0)
Hemoglobin: 7 g/dL — ABNORMAL LOW (ref 13.0–17.0)
MCH: 28.7 pg (ref 26.0–34.0)
MCHC: 29.4 g/dL — ABNORMAL LOW (ref 30.0–36.0)
MCV: 97.5 fL (ref 80.0–100.0)
Platelets: 228 10*3/uL (ref 150–400)
RBC: 2.44 MIL/uL — ABNORMAL LOW (ref 4.22–5.81)
RDW: 14.1 % (ref 11.5–15.5)
WBC: 14 10*3/uL — ABNORMAL HIGH (ref 4.0–10.5)
nRBC: 0 % (ref 0.0–0.2)

## 2020-10-23 LAB — BASIC METABOLIC PANEL
Anion gap: 13 (ref 5–15)
Anion gap: 16 — ABNORMAL HIGH (ref 5–15)
BUN: 103 mg/dL — ABNORMAL HIGH (ref 8–23)
BUN: 108 mg/dL — ABNORMAL HIGH (ref 8–23)
CO2: 22 mmol/L (ref 22–32)
CO2: 20 mmol/L — ABNORMAL LOW (ref 22–32)
Calcium: 7.9 mg/dL — ABNORMAL LOW (ref 8.9–10.3)
Calcium: 8 mg/dL — ABNORMAL LOW (ref 8.9–10.3)
Chloride: 114 mmol/L — ABNORMAL HIGH (ref 98–111)
Chloride: 115 mmol/L — ABNORMAL HIGH (ref 98–111)
Creatinine, Ser: 3.31 mg/dL — ABNORMAL HIGH (ref 0.61–1.24)
Creatinine, Ser: 3.49 mg/dL — ABNORMAL HIGH (ref 0.61–1.24)
GFR, Estimated: 18 mL/min — ABNORMAL LOW (ref >60)
GFR, Estimated: 17 mL/min — ABNORMAL LOW (ref >60)
Glucose, Bld: 236 mg/dL — ABNORMAL HIGH (ref 70–99)
Glucose, Bld: 199 mg/dL — ABNORMAL HIGH (ref 70–99)
Potassium: 5.2 mmol/L — ABNORMAL HIGH (ref 3.5–5.1)
Potassium: 4.1 mmol/L (ref 3.5–5.1)
Sodium: 149 mmol/L — ABNORMAL HIGH (ref 135–145)
Sodium: 151 mmol/L — ABNORMAL HIGH (ref 135–145)

## 2020-10-23 LAB — TRIGLYCERIDES: Triglycerides: 57 mg/dL (ref <150)

## 2020-10-23 LAB — GLUCOSE, CAPILLARY
Glucose-Capillary: 158 mg/dL — ABNORMAL HIGH (ref 70–99)
Glucose-Capillary: 164 mg/dL — ABNORMAL HIGH (ref 70–99)
Glucose-Capillary: 187 mg/dL — ABNORMAL HIGH (ref 70–99)
Glucose-Capillary: 189 mg/dL — ABNORMAL HIGH (ref 70–99)
Glucose-Capillary: 213 mg/dL — ABNORMAL HIGH (ref 70–99)
Glucose-Capillary: 214 mg/dL — ABNORMAL HIGH (ref 70–99)
Glucose-Capillary: 241 mg/dL — ABNORMAL HIGH (ref 70–99)

## 2020-10-23 LAB — PREPARE RBC (CROSSMATCH)

## 2020-10-23 LAB — COOXEMETRY PANEL
Carboxyhemoglobin: 1 % (ref 0.5–1.5)
Methemoglobin: 1 % (ref 0.0–1.5)
O2 Saturation: 83.1 %
Total hemoglobin: 6.9 g/dL — CL (ref 12.0–16.0)

## 2020-10-23 LAB — MAGNESIUM: Magnesium: 2.8 mg/dL — ABNORMAL HIGH (ref 1.7–2.4)

## 2020-10-23 LAB — PHOSPHORUS: Phosphorus: 8.4 mg/dL — ABNORMAL HIGH (ref 2.5–4.6)

## 2020-10-23 MED ORDER — SODIUM ZIRCONIUM CYCLOSILICATE 10 G PO PACK
10.0000 g | PACK | Freq: Two times a day (BID) | ORAL | Status: DC
  Administered 2020-10-23: 10:00:00 10 g via ORAL
  Filled 2020-10-23: qty 1

## 2020-10-23 MED ORDER — SODIUM CHLORIDE 0.9% IV SOLUTION: Freq: Once | INTRAVENOUS | Status: AC

## 2020-10-23 MED ORDER — SODIUM ZIRCONIUM CYCLOSILICATE 10 G PO PACK
10.0000 g | PACK | Freq: Two times a day (BID) | ORAL | Status: AC
  Administered 2020-10-23: 22:00:00 10 g
  Filled 2020-10-23: qty 1

## 2020-10-23 MED ORDER — INSULIN ASPART 100 UNIT/ML ~~LOC~~ SOLN
5.0000 [IU] | SUBCUTANEOUS | Status: DC
  Administered 2020-10-23 – 2020-10-29 (×33): 5 [IU] via SUBCUTANEOUS

## 2020-10-23 MED ORDER — FUROSEMIDE 10 MG/ML IJ SOLN
120.0000 mg | Freq: Once | INTRAVENOUS | Status: AC
  Administered 2020-10-23: 10:00:00 120 mg via INTRAVENOUS
  Filled 2020-10-23: qty 2

## 2020-10-23 NOTE — Progress Notes (Signed)
Patient's pulmonary status is significantly improved.  Cardiac status appears acceptable although patient still has evidence of some degree of cardiomyopathy.  Main issue of concern is that of worsening renal failure etiology unclear but likely secondary to hypoperfusion.  Continues to make urine however and hopefully renal failure will stabilize and begin to slowly improve.  Patient with continued decreased level of consciousness despite being off sedation.  Likely a combination of residual drug effect coupled with some metabolic derangement from uremia.

## 2020-10-23 NOTE — Progress Notes (Signed)
Late entry note.  The patient's pulmonary status has improved somewhat.  Unfortunately is renal failure is worsening somewhat.  Patient still with evidence of heavy sedation secondary narcotics despite them being stopped earlier this morning.  Overall patient looks better than he did on Friday but still remains critically ill.  Hopeful resumption of ventilatory weaning to begin once sedation improved.

## 2020-10-23 NOTE — Progress Notes (Signed)
CRITICAL VALUE ALERT  Critical Value:  Hemoglobin 6.9  Date & Time Notied:  10/23/2020 0415  Provider Notified: Dr. Oletta Darter  Orders Received/Actions taken: orders received for one unit RBC

## 2020-10-23 NOTE — Progress Notes (Signed)
[facility] Progress Note Patient Name: Dennis Carey DOB: 01/08/1935 MRN: [id number]   Date of Service  10/23/2020  HPI/Events of Note  Anemia - Hgb = 6.9.   eICU Interventions  Will transfuse 1 unit PRBC now.      Intervention Category Major Interventions: Other:  Shailynn Fong Cornelia Copa 10/23/2020, 4:18 AM

## 2020-10-23 NOTE — Progress Notes (Signed)
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 He had no operative complications and was discharged home. He returned to [facility] 10 days post-op due to fatigue, weakness, and poorly controlled back pain. He was also found to have sick contact and he was admit to neurosurgery service for pain control, IV hydration and possible dispo to CIR.   Past Medical History  CAD, CKD3, HTN, [facility] Events   11/01/2020 Admisson 12/10 intubated for hypoxia this morning, increased work of breathing.  Seem to respond nicely to titration of PEEP.  Changed sedation to propofol and fentanyl.  Broad-spectrum antibiotics continued.  Primary barriers to progress seem to be atelectasis, adynamic ileus, resolving pneumonia, and severe deconditioning all superimposed on his underlying stress cardiomyopathy   Consults:  Neurosurgery Cardiology  Procedures:  10/13/20 Intubation 12/5 bronch  Significant Diagnostic Tests:  10/11/20 Chest X-ray, minimal left basilar atelectasis 12/3 LE venous doppler: neg 12/4 echo: LVEF 25-30% 12/10 KUB: Consistent with colonic ileus-gas present down to rectum 12/13 Renal US > mild perinephric fluid, no obstruction.  Micro Data:  11/06/2020 COVID, Flu neg 12/4:  MRSA PCR neg 12/4 Bronch tracheal aspirate - GNR, GPC growing 12/4 blood: ngtd  Antimicrobials:  Vanc 12/4 > 12/13 Cefepime 12/4 > 12/10 Doxycycline 12/6 > 2/9 Merrem 12/10 >   Interim history/subjective:  Afebrile.  Hgb 6.9 > getting 1u PRBC now. Renal function unfortunately worse. Mental status remains poor; although, having more purposeful  movements today.  Objective   Blood pressure (!) 111/51, pulse 90, temperature 98.4 F (36.9 C), temperature source Oral, resp. rate (!) 27, height 5\' 7"  (1.702 m), weight 68.5 kg, SpO2 100 %. CVP:  [8 mmHg-13 mmHg] 10 mmHg  Vent Mode: PSV;CPAP FiO2 (%):  [40 %] 40 % Set Rate:  [20 bmp] 20 bmp Vt Set:  [520 mL] 520 mL PEEP:  [8 cmH20] 8 cmH20 Pressure Support:  [10 cmH20] 10 cmH20 Plateau Pressure:  [23 cmH20-36 cmH20] 26 cmH20   Intake/Output Summary (Last 24 hours) at 10/23/2020 0838 Last data filed at 10/23/2020 0748 Gross per 24 hour  Intake 1834.91 ml  Output 3375 ml  Net -1540.09 ml   Filed Weights   10/21/20 0500 10/22/20 0500 10/23/20 0500  Weight: 72.3 kg 69.7 kg 68.5 kg   Physical Exam: General: Elderly male, resting in bed, in NAD. Neuro: Sedated, not following commands but does have increased purposeful movements. HEENT: [place]/AT. Sclerae anicteric. ETT in place. Cardiovascular: RRR, no M/R/G.  Lungs: Respirations even and unlabored.  CTA bilaterally, No W/R/R. Abdomen: BS x 4, soft, NT/ND.  Musculoskeletal: No gross deformities, no edema.  Skin: Intact, warm, no rashes.  Assessment & Plan:   L5-S1 DJD s/p laminectomy/decompression - Per neurosurgery.  Acute hypoxic respiratory failure due to acute pulm edema and likely aspiration pneumonitis vs HCAP (s/p 10 days vanc, currently on 7 days merrem). - Continue vent support. - Weaning / SBT has been a challenge due to mental status; however, is tolerating well at 8/5 today. - Continue SBT but if no improvement in mental status, will need to consider trach over the next few days (especially  if he progresses to trial of CRRT / iHD and continues to be encephalopathic). - Continue merrem x 7 days total (stop date 12/16, felt to have improved after starting, possibly 2/2 better coverage of gut translocation). - Follow CXR.  Acute encephalopathy - remains unresponsive despite propofol off for 4 - 5 hours.  Complicated  by slowed med clearance in setting AKI. Hypoactive delirium. - Hold all sedation as able. - D/c seroquel and clonazepam (had been started on seroquel at 50mg  BID 12/11 and clonazepam 0.25mg  BID 12/12). - Trial high dose lasix today to see if we can lower BUN, if no improvement then will need to consider trial CRRT / iHD.  Adynamic ileus - Continue Reglan.  A.flutter with RVR after diuresis - improved with amio. 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 - Continue amio.  AKI due to ATN - renal US 12/13 with mild fluid and no obstruction. - Trial of 120mg  lasix x 1 today.  If no improvement in renal function, then will need to consider trial CRRT / iHD 12/15. - Follow BMP and UOP.  Hypernatremia - 2/2 albumin. - D/c albumin.  Hyperkalemia. - 10mg  Lokelma x 2 doses today. - Lasix as above.  Type 2 diabetes. - SSI, levemir.  Anemia of acute illness - Hgb drop to 6.9 overnight 12/13, s/p 1u PRBC. - Transfuse for Hgb < 7.  Generalized deconditioning. - PT / OT when able.   Best practice (evaluated daily)  Diet: TF Pain/Anxiety/Delirium protocol (if indicated): Hold all sedation as able (propofol gtt).  D/c Seroquel and Clonazepam for now. VAP protocol (if indicated): ordered DVT prophylaxis: heparin twice daily GI prophylaxis: PPI Glucose control: SSI, levemir Mobility: BR Code Status: Full Communication: Daughter updated at bedside 12/13 extensively. Disposition: ICU   CC time: 35 min.   Montey Hora, [facility] 10/23/2020, 8:38 AM

## 2020-10-23 NOTE — Progress Notes (Signed)
Patient ID: Dennis Carey, male   DOB: 1935-03-01, 85 y.o.   MRN: [id number]     Advanced Heart Failure Rounding Note   Subjective:   12/7 Bronch with copious secretions that were removed.  12/8 Extubated but required re-intubation.   Continues on vent, stable settings. FiO2 40%.  Not waking up.   Sputum cultures w/ normal respiratory flora.. Now on meropenem. WBC and PCT falling. Secretions much improved   NE off. CVP 9-10  Co-ox 83%  SCr continues to rise, 2.03>>2.37>>2.75> 3.31 BUN 103.   Na 149  Hgb 7.0  Maintainingg NSR on IV amio  Objective:   Weight Range:  Vital Signs:   Temp:  [97.6 F (36.4 C)-98.8 F (37.1 C)] 98.4 F (36.9 C) (12/14 0746) Pulse Rate:  [90-113] 96 (12/14 0900) Resp:  [19-33] 25 (12/14 0900) BP: (91-153)/(47-89) 135/63 (12/14 0900) SpO2:  [92 %-100 %] 100 % (12/14 0900) Arterial Line BP: (75-183)/(31-93) 165/93 (12/14 0800) FiO2 (%):  [40 %] 40 % (12/14 0741) Weight:  [68.5 kg] 68.5 kg (12/14 0500) Last BM Date: 10/23/20  Weight change: Filed Weights   10/21/20 0500 10/22/20 0500 10/23/20 0500  Weight: 72.3 kg 69.7 kg 68.5 kg    Intake/Output:   Intake/Output Summary (Last 24 hours) at 10/23/2020 1027 Last data filed at 10/23/2020 0951 Gross per 24 hour  Intake 1655.22 ml  Output 3205 ml  Net -1549.78 ml     PHYSICAL EXAM: CVP 9-10 General: intubated and sedated HEENT: normal + ETT Neck: supple. JVP 9 Carotids 2+ bilat; no bruits. No lymphadenopathy or thryomegaly appreciated. Cor: PMI nondisplaced. Regular rate & rhythm. No rubs, gallops or murmurs. Lungs: bronchial Abdomen: soft, nontender, nondistended. No hepatosplenomegaly. No bruits or masses. Good bowel sounds. Extremities: no cyanosis, clubbing, rash, mild UE edema Trace LE edema Neuro: sedated will respond to noxious stimul   Telemetry: sinus 90-100 Personally reviewed   Labs: Basic Metabolic Panel: Recent Labs  Lab 10/19/20 0324 10/20/20 0233  10/20/20 1433 10/21/20 0500 10/21/20 0612 10/22/20 0641 10/23/20 0324  NA 141 139 138 142 144 148* 149*  K 3.9 4.7 4.1 4.1 3.9 4.4 5.2*  CL 108 109 108 112*  --  113* 114*  CO2 22 19* 19* 18*  --  20* 22  GLUCOSE 134* 235* 117* 138*  --  212* 236*  BUN 32* 52* 60* 71*  --  84* 103*  CREATININE 1.40* 2.18* 2.03* 2.37*  --  2.75* 3.31*  CALCIUM 7.5* 7.7* 8.3* 7.8*  --  7.5* 7.9*  MG 2.0 2.2  --  2.3  --  2.3 2.8*  PHOS 3.8 5.9*  --  5.5*  --  6.4* 8.4*    Liver Function Tests: No results for input(s): AST, ALT, ALKPHOS, BILITOT, PROT, ALBUMIN in the last 168 hours. No results for input(s): LIPASE, AMYLASE in the last 168 hours. No results for input(s): AMMONIA in the last 168 hours.  CBC: Recent Labs  Lab 10/19/20 0324 10/20/20 0233 10/21/20 0500 10/21/20 0612 10/22/20 0641 10/23/20 0324  WBC 12.4* 25.6* 20.8*  --  17.4* 14.0*  HGB 8.1* 8.4* 8.0* 7.1* 7.6* 7.0*  HCT 25.4* 26.7* 26.3* 21.0* 24.5* 23.8*  MCV 94.1 97.8 95.6  --  94.2 97.5  PLT 223 240 243  --  254 228    Cardiac Enzymes: No results for input(s): CKTOTAL, CKMB, CKMBINDEX, TROPONINI in the last 168 hours.  BNP: BNP (last 3 results) Recent Labs    10/13/20 1513  BNP  589.0*    ProBNP (last 3 results) No results for input(s): PROBNP in the last 8760 hours.    Other results:  Imaging: US RENAL  Result Date: 10/22/2020 CLINICAL DATA:  Acute renal injury EXAM: RENAL / URINARY TRACT ULTRASOUND COMPLETE COMPARISON:  None. FINDINGS: Right Kidney: Renal measurements: 9.7 x 4.5 x 4.6 cm. = volume: 105 mL. Minimal perinephric fluid is noted. No mass lesion or hydronephrosis is seen. Left Kidney: Renal measurements: 9.9 x 4.8 x 4.3 cm. = volume: 106 mL. Minimal perinephric fluid is noted. No mass lesion or hydronephrosis is seen. Bladder: Appears normal for degree of bladder distention. Other: None. IMPRESSION: Mild perinephric fluid bilaterally. No obstructive changes are noted. Electronically Signed   By:  Inez Catalina M.D.   On: 10/22/2020 15:24   DG CHEST PORT 1 VIEW  Result Date: 10/21/2020 CLINICAL DATA:  CHF EXAM: PORTABLE CHEST 1 VIEW COMPARISON:  10/19/2020 FINDINGS: Slight interval improvement in diffuse bilateral interstitial pulmonary opacity, particularly the upper lungs. Interval removal of previously noted right-sided pulmonary artery catheter. Support apparatus otherwise remains unchanged, with endotracheal tube over the lower trachea esophagogastric tube tip and side port below the diaphragm. IMPRESSION: 1. Slight interval improvement in diffuse bilateral interstitial pulmonary opacity, particularly the upper lungs, consistent with improved edema. 2. Interval removal of previously noted right-sided pulmonary artery catheter. Other support apparatus unchanged. Electronically Signed   By: Eddie Candle M.D.   On: 10/21/2020 13:28     Medications:     Scheduled Medications: . aspirin  81 mg Per Tube Daily  . bethanechol  10 mg Per Tube TID  . chlorhexidine gluconate (MEDLINE KIT)  15 mL Mouth Rinse BID  . Chlorhexidine Gluconate Cloth  6 each Topical Daily  . ezetimibe  10 mg Per Tube Daily  . feeding supplement (PROSource TF)  90 mL Per Tube BID  . heparin injection (subcutaneous)  5,000 Units Subcutaneous Q8H  . insulin aspart  0-15 Units Subcutaneous Q4H  . insulin aspart  5 Units Subcutaneous Q4H  . insulin detemir  15 Units Subcutaneous BID  . ipratropium-albuterol  3 mL Nebulization Q6H  . mouth rinse  15 mL Mouth Rinse 10 times per day  . metoCLOPramide (REGLAN) injection  5 mg Intravenous Q8H  . pantoprazole sodium  40 mg Per Tube Q1200  . rosuvastatin  20 mg Per Tube Daily  . sodium chloride flush  10-40 mL Intracatheter Q12H  . sodium chloride flush  10-40 mL Intracatheter Q12H  . sodium chloride flush  3 mL Intravenous Q12H  . sodium zirconium cyclosilicate  10 g Oral BID    Infusions: . sodium chloride    . sodium chloride Stopped (10/18/20 1958)  .  amiodarone 30 mg/hr (10/23/20 0800)  . feeding supplement (VITAL 1.5 CAL) 1,000 mL (10/22/20 1643)  . furosemide 120 mg (10/23/20 0939)  . meropenem (MERREM) IV 200 mL/hr at 10/23/20 0800  . norepinephrine (LEVOPHED) Adult infusion Stopped (10/22/20 1700)    PRN Medications: Place/Maintain arterial line **AND** sodium chloride, sodium chloride, acetaminophen (TYLENOL) oral liquid 160 mg/5 mL, ondansetron **OR** ondansetron (ZOFRAN) IV, sodium chloride flush, sodium chloride flush   Assessment/Plan:   1. Acute Hypoxemic Respiratory Failure - due to PNA and HF - Developed acute respiratory failure on 12/4 and required intubation.  - CXR with pulmonary edema + infiltrates on 12/4.  - PCT 109 -> 84->43 -> 18- Developed mucous plugging 12/6 with severe respiratory distress and hypotension. Resolved with deep suctioning. Bronch with diffuse  secretions.  - Extubated 12/8 but required re intubation.  - Sputum cultures w/ normal respiratory flora.  - On meropenem now. WBC and PCT down secretions much improved - PNA much improved. Main barrier to extubation is his mental status but this is complicated by uremia. Long discussion with family and CCM about next steps. Will challenge with IV lasix. If no response will have very low threshold to start CVVHD. Ideally would like to get him awake and have trial of extubation otherwise may need trach.   2. Shock - likely sepsis/cardiogenic combined. Good co-ox yesterday, pending today.  - Echo- reduced EF with EF 25-30% and WMA. RV normal.   - Now off NE - Co-ox stable at 83% Can restart as needed - Creatinine up to 3.01, CVP 9-10. One dose lasix today - On antimicrobial coverage for HCAP- expanded 12/8.  - Blood CX- NGTD   - Sputum Cx-  w/ normal respiratory flora. No staph aureus or pseudomonas growing - C-diff negative   3. Acute Systolic Heart Failure  - As above Echo with EF 25-30% . No previous ECHO. No previous cath.  - Hs Trop  [phone]. - RHC 12/6 with low filling pressures after diuresis and low index 1.9. SVR 1467 - Unable to add GDMT due to shock.  - Co-ox 83% - Management as above - Suspect combination of septic and cardiogenic shock. Troponin moderately elevated but flat. Possibility of Tako-tsubo/septic CM has been raised and seems to fit clinically but cannot rule out CAD.  If he recovers and creatinine comes down, needs coronary angiography.   4. Back Pain--> S/P L5-S1 laminectomy with fusion 09/28/2020  Per [facility]  5. AKI - Creatinine/BUN worse today with stable MAP and co-ox - SCr  2.03>>2.37>>2.75. >> 3.01 - Test dose lasix today. - Low threshold for CVVHD. Give one dose lokelma. - Keep MAP > 65 Avoid hypotension  6. Ileus - Improved, has had BM though still distended.    7. Hypernatremia - Na 149 - free water boluses   8. Anemia - Hgb 7.0 - monitor, transfuse for hgb <7.0   CRITICAL CARE Performed by: Glori Bickers  Total critical care time: 45 minutes  Critical care time was exclusive of separately billable procedures and treating other patients.  Critical care was necessary to treat or prevent imminent or life-threatening deterioration.  Critical care was time spent personally by me (independent of midlevel providers or residents) on the following activities: development of treatment plan with patient and/or surrogate as well as nursing, discussions with consultants, evaluation of patient's response to treatment, examination of patient, obtaining history from patient or surrogate, ordering and performing treatments and interventions, ordering and review of laboratory studies, ordering and review of radiographic studies, pulse oximetry and re-evaluation of patient's condition.    Length of Stay: 13  Glori Bickers, MD  10:27 AM

## 2020-10-24 ENCOUNTER — Inpatient Hospital Stay (HOSPITAL_COMMUNITY): Payer: Medicare Other

## 2020-10-24 DIAGNOSIS — I5181 Takotsubo syndrome: Secondary | ICD-10-CM

## 2020-10-24 DIAGNOSIS — J80 Acute respiratory distress syndrome: Secondary | ICD-10-CM

## 2020-10-24 LAB — BASIC METABOLIC PANEL
Anion gap: 18 — ABNORMAL HIGH (ref 5–15)
BUN: 124 mg/dL — ABNORMAL HIGH (ref 8–23)
CO2: 22 mmol/L (ref 22–32)
Calcium: 7.8 mg/dL — ABNORMAL LOW (ref 8.9–10.3)
Chloride: 110 mmol/L (ref 98–111)
Creatinine, Ser: 3.88 mg/dL — ABNORMAL HIGH (ref 0.61–1.24)
GFR, Estimated: 15 mL/min — ABNORMAL LOW (ref >60)
Glucose, Bld: 288 mg/dL — ABNORMAL HIGH (ref 70–99)
Potassium: 3.9 mmol/L (ref 3.5–5.1)
Sodium: 150 mmol/L — ABNORMAL HIGH (ref 135–145)

## 2020-10-24 LAB — CBC
HCT: 30.5 % — ABNORMAL LOW (ref 39.0–52.0)
Hemoglobin: 9.5 g/dL — ABNORMAL LOW (ref 13.0–17.0)
MCH: 29.1 pg (ref 26.0–34.0)
MCHC: 31.1 g/dL (ref 30.0–36.0)
MCV: 93.6 fL (ref 80.0–100.0)
Platelets: 268 10*3/uL (ref 150–400)
RBC: 3.26 MIL/uL — ABNORMAL LOW (ref 4.22–5.81)
RDW: 14.6 % (ref 11.5–15.5)
WBC: 12.1 10*3/uL — ABNORMAL HIGH (ref 4.0–10.5)
nRBC: 0 % (ref 0.0–0.2)

## 2020-10-24 LAB — TYPE AND SCREEN
ABO/RH(D): A POS
Antibody Screen: NEGATIVE
Unit division: 0

## 2020-10-24 LAB — BPAM RBC
Blood Product Expiration Date: 202201082359
ISSUE DATE / TIME: 202112140554
Unit Type and Rh: 6200

## 2020-10-24 LAB — GLUCOSE, CAPILLARY
Glucose-Capillary: 167 mg/dL — ABNORMAL HIGH (ref 70–99)
Glucose-Capillary: 126 mg/dL — ABNORMAL HIGH (ref 70–99)
Glucose-Capillary: 171 mg/dL — ABNORMAL HIGH (ref 70–99)
Glucose-Capillary: 217 mg/dL — ABNORMAL HIGH (ref 70–99)
Glucose-Capillary: 241 mg/dL — ABNORMAL HIGH (ref 70–99)
Glucose-Capillary: 234 mg/dL — ABNORMAL HIGH (ref 70–99)

## 2020-10-24 LAB — TRIGLYCERIDES: Triglycerides: 55 mg/dL (ref <150)

## 2020-10-24 LAB — COOXEMETRY PANEL
Carboxyhemoglobin: 1 % (ref 0.5–1.5)
Carboxyhemoglobin: 0.7 % (ref 0.5–1.5)
Methemoglobin: 0.9 % (ref 0.0–1.5)
Methemoglobin: 0.6 % (ref 0.0–1.5)
O2 Saturation: 94.1 %
O2 Saturation: 82 %
Total hemoglobin: 9.8 g/dL — ABNORMAL LOW (ref 12.0–16.0)
Total hemoglobin: 15.1 g/dL (ref 12.0–16.0)

## 2020-10-24 LAB — MAGNESIUM: Magnesium: 2.7 mg/dL — ABNORMAL HIGH (ref 1.7–2.4)

## 2020-10-24 LAB — HEPATITIS B SURFACE ANTIGEN: Hepatitis B Surface Ag: NONREACTIVE

## 2020-10-24 LAB — HEPATITIS B SURFACE ANTIBODY,QUALITATIVE: Hep B S Ab: NONREACTIVE

## 2020-10-24 LAB — PHOSPHORUS: Phosphorus: 8.2 mg/dL — ABNORMAL HIGH (ref 2.5–4.6)

## 2020-10-24 MED ORDER — SODIUM CHLORIDE 0.9 % IV SOLN: 100.0000 mL | INTRAVENOUS | Status: DC | PRN

## 2020-10-24 MED ORDER — PROSOURCE TF PO LIQD
45.0000 mL | Freq: Two times a day (BID) | ORAL | Status: DC
  Administered 2020-10-24 – 2020-10-30 (×11): 45 mL
  Filled 2020-10-24 (×11): qty 45

## 2020-10-24 MED ORDER — VITAL 1.5 CAL PO LIQD
1000.0000 mL | ORAL | Status: DC
  Administered 2020-10-24 – 2020-10-28 (×5): 1000 mL

## 2020-10-24 MED ORDER — ALTEPLASE 2 MG IJ SOLR: 2.0000 mg | Freq: Once | INTRAMUSCULAR | Status: DC | PRN

## 2020-10-24 MED ORDER — HEPARIN SODIUM (PORCINE) 1000 UNIT/ML DIALYSIS
1000.0000 [IU] | INTRAMUSCULAR | Status: DC | PRN
  Administered 2020-10-25: 03:00:00 1000 [IU] via INTRAVENOUS_CENTRAL
  Filled 2020-10-24 (×3): qty 1

## 2020-10-24 MED ORDER — SODIUM CHLORIDE 0.9 % IV SOLN
100.0000 mL | INTRAVENOUS | Status: DC | PRN
Start: 2020-10-24 — End: 2020-11-01
  Administered 2020-10-28 (×3): 100 mL via INTRAVENOUS

## 2020-10-24 MED ORDER — FREE WATER
200.0000 mL | Status: DC
  Administered 2020-10-24 – 2020-10-27 (×21): 200 mL

## 2020-10-24 NOTE — Plan of Care (Signed)
°  Problem: Clinical Measurements: °Goal: Will remain free from infection °Outcome: Progressing °Goal: Respiratory complications will improve °Outcome: Progressing °  °

## 2020-10-24 NOTE — Progress Notes (Addendum)
Patient ID: KHAMBREL AMSDEN, male   DOB: 04-21-1935, 85 y.o.   MRN: [id number]     Advanced Heart Failure Rounding Note   Subjective:   12/7 Bronch with copious secretions that were removed.  12/8 Extubated but required re-intubation.   Continues on vent, stable settings. FiO2 40%.  Off sedation > 24 hours.    Sputum cultures w/ normal respiratory flora. Now on meropenem. WBC and PCT falling. Secretions much improved   Off pressors. CO-OX 94%--> repeating.    SCr continues to rise, 2.03>>2.37>>2.75> 3.31>>3.88 BUN 124  Na 150   Hgb 9.5   Yesterday given 120 mg IV lasix with brisk diuresis noted--> Urine output >5 liters.    Objective:   Weight Range:  Vital Signs:   Temp:  [98.4 F (36.9 C)-99.9 F (37.7 C)] 98.6 F (37 C) (12/15 0400) Pulse Rate:  [93-107] 98 (12/15 0600) Resp:  [11-36] 31 (12/15 0600) BP: (112-139)/(51-69) 116/59 (12/15 0600) SpO2:  [95 %-100 %] 100 % (12/15 0600) Arterial Line BP: (122-197)/(42-93) 147/57 (12/15 0600) FiO2 (%):  [40 %] 40 % (12/15 0600) Weight:  [69.3 kg] 69.3 kg (12/15 0500) Last BM Date: 10/23/20  Weight change: Filed Weights   10/22/20 0500 10/23/20 0500 10/24/20 0500  Weight: 69.7 kg 68.5 kg 69.3 kg    Intake/Output:   Intake/Output Summary (Last 24 hours) at 10/24/2020 0733 Last data filed at 10/24/2020 2563 Gross per 24 hour  Intake 2551.53 ml  Output 5820 ml  Net -3268.47 ml    CVP 5 PHYSICAL EXAM: General:  Intubated.  HEENT: ETT Neck: supple. JVP 5-6. Carotids 2+ bilat; no bruits. No lymphadenopathy or thryomegaly appreciated. Cor: PMI nondisplaced. Regular rate & rhythm. No rubs, gallops or murmurs. Lungs: Coarse throughout Abdomen: soft, nontender, nondistended. No hepatosplenomegaly. No bruits or masses. Good bowel sounds. Extremities: no cyanosis, clubbing, rash, edema Neuro: Intubated . No following commands  Telemetry:SR 90-100s   Labs: Basic Metabolic Panel: Recent Labs  Lab 10/20/20 0233  10/20/20 1433 10/21/20 0500 10/21/20 0612 10/22/20 0641 10/23/20 0324 10/23/20 1415 10/24/20 0440  NA 139   < > 142 144 148* 149* 151* 150*  K 4.7   < > 4.1 3.9 4.4 5.2* 4.1 3.9  CL 109   < > 112*  --  113* 114* 115* 110  CO2 19*   < > 18*  --  20* 22 20* 22  GLUCOSE 235*   < > 138*  --  212* 236* 199* 288*  BUN 52*   < > 71*  --  84* 103* 108* 124*  CREATININE 2.18*   < > 2.37*  --  2.75* 3.31* 3.49* 3.88*  CALCIUM 7.7*   < > 7.8*  --  7.5* 7.9* 8.0* 7.8*  MG 2.2  --  2.3  --  2.3 2.8*  --  2.7*  PHOS 5.9*  --  5.5*  --  6.4* 8.4*  --  8.2*   < > = values in this interval not displayed.    Liver Function Tests: No results for input(s): AST, ALT, ALKPHOS, BILITOT, PROT, ALBUMIN in the last 168 hours. No results for input(s): LIPASE, AMYLASE in the last 168 hours. No results for input(s): AMMONIA in the last 168 hours.  CBC: Recent Labs  Lab 10/20/20 0233 10/21/20 0500 10/21/20 0612 10/22/20 0641 10/23/20 0324 10/24/20 0440  WBC 25.6* 20.8*  --  17.4* 14.0* 12.1*  HGB 8.4* 8.0* 7.1* 7.6* 7.0* 9.5*  HCT 26.7* 26.3* 21.0* 24.5* 23.8*  30.5*  MCV 97.8 95.6  --  94.2 97.5 93.6  PLT 240 243  --  254 228 268    Cardiac Enzymes: No results for input(s): CKTOTAL, CKMB, CKMBINDEX, TROPONINI in the last 168 hours.  BNP: BNP (last 3 results) Recent Labs    10/13/20 1513  BNP 589.0*    ProBNP (last 3 results) No results for input(s): PROBNP in the last 8760 hours.    Other results:  Imaging: US RENAL  Result Date: 10/22/2020 CLINICAL DATA:  Acute renal injury EXAM: RENAL / URINARY TRACT ULTRASOUND COMPLETE COMPARISON:  None. FINDINGS: Right Kidney: Renal measurements: 9.7 x 4.5 x 4.6 cm. = volume: 105 mL. Minimal perinephric fluid is noted. No mass lesion or hydronephrosis is seen. Left Kidney: Renal measurements: 9.9 x 4.8 x 4.3 cm. = volume: 106 mL. Minimal perinephric fluid is noted. No mass lesion or hydronephrosis is seen. Bladder: Appears normal for degree of  bladder distention. Other: None. IMPRESSION: Mild perinephric fluid bilaterally. No obstructive changes are noted. Electronically Signed   By: Inez Catalina M.D.   On: 10/22/2020 15:24     Medications:     Scheduled Medications: . aspirin  81 mg Per Tube Daily  . bethanechol  10 mg Per Tube TID  . chlorhexidine gluconate (MEDLINE KIT)  15 mL Mouth Rinse BID  . Chlorhexidine Gluconate Cloth  6 each Topical Daily  . ezetimibe  10 mg Per Tube Daily  . feeding supplement (PROSource TF)  90 mL Per Tube BID  . heparin injection (subcutaneous)  5,000 Units Subcutaneous Q8H  . insulin aspart  0-15 Units Subcutaneous Q4H  . insulin aspart  5 Units Subcutaneous Q4H  . insulin detemir  15 Units Subcutaneous BID  . ipratropium-albuterol  3 mL Nebulization Q6H  . mouth rinse  15 mL Mouth Rinse 10 times per day  . metoCLOPramide (REGLAN) injection  5 mg Intravenous Q8H  . pantoprazole sodium  40 mg Per Tube Q1200  . rosuvastatin  20 mg Per Tube Daily  . sodium chloride flush  10-40 mL Intracatheter Q12H  . sodium chloride flush  10-40 mL Intracatheter Q12H  . sodium chloride flush  3 mL Intravenous Q12H    Infusions: . sodium chloride    . sodium chloride Stopped (10/18/20 1958)  . amiodarone 30 mg/hr (10/24/20 0409)  . feeding supplement (VITAL 1.5 CAL) 1,000 mL (10/23/20 1801)  . meropenem (MERREM) IV Stopped (10/23/20 2313)  . norepinephrine (LEVOPHED) Adult infusion Stopped (10/22/20 1700)    PRN Medications: Place/Maintain arterial line **AND** sodium chloride, sodium chloride, acetaminophen (TYLENOL) oral liquid 160 mg/5 mL, ondansetron **OR** ondansetron (ZOFRAN) IV, sodium chloride flush, sodium chloride flush   Assessment/Plan:   1. Acute Hypoxemic Respiratory Failure - due to PNA and HF - Developed acute respiratory failure on 12/4 and required intubation.  - CXR with pulmonary edema + infiltrates on 12/4.  - PCT 109 -> 84->43 -> 18- Developed mucous plugging 12/6 with severe  respiratory distress and hypotension. Resolved with deep suctioning. Bronch with diffuse secretions.  - Extubated 12/8 but required re intubation.  - Sputum cultures w/ normal respiratory flora.  - On meropenem now.  - PNA much improved. Main barrier to extubation is his mental status but this is complicated by uremia. - Off sedation > 24 hours.   2. Shock - likely sepsis/cardiogenic combined. Good co-ox yesterday, pending today.  - Echo- reduced EF with EF 25-30% and WMA. RV normal.   - Now off NE -  Co-ox 90%. Repeat now.  - Creatinine up to 3.88, BUN up to 124 - On antimicrobial coverage for HCAP- expanded 12/8.  - Blood CX- NGTD   - Sputum Cx-  w/ normal respiratory flora. No staph aureus or pseudomonas growing - C-diff negative   3. Acute Systolic Heart Failure  - As above Echo with EF 25-30% . No previous ECHO. No previous cath.  - Hs Trop [phone]. - RHC 12/6 with low filling pressures after diuresis and low index 1.9. SVR 1467 - Unable to add GDMT due to shock.  - Repeat CO-OX 82%.  - Management as above - Suspect combination of septic and cardiogenic shock. Troponin moderately elevated but flat. Possibility of Tako-tsubo/septic CM has been raised and seems to fit clinically but cannot rule out CAD.  If he recovers and creatinine comes down, needs coronary angiography.   4. Back Pain--> S/P L5-S1 laminectomy with fusion 09/28/2020  Per [facility]  5. AKI - Creatinine/BUN worse today with stable MAP and co-ox - SCr  2.03>>2.37>>2.75. >> 3.01>>3.88 - Nephrology consulted.  - Keep MAP > 65 Avoid hypotension  6. Ileus - Improved, has had BM though still distended.    7. Hypernatremia - Na 150  - free water boluses   8. Anemia - Hgb 9.5 today.  - monitor, transfuse for hgb <7.0   Length of Stay: [place], NP  7:33 AM  Agree with above.  Remains intubated/sedated. Was mildly responsive today. Got 1 dose IV lasix yesterday with good urine output but  BUN creatinine continue to rise. CXR improved. Co-ox ok of NE. Remains in NSR on IV amio  General: Intubated/sedated HEENT: normal+ ETT Neck: supple. no JVD. Carotids 2+ bilat; no bruits. No lymphadenopathy or thryomegaly appreciated. Cor: PMI nondisplaced. Regular rate & rhythm. No rubs, gallops or murmurs. Lungs: coarse Abdomen: soft, nontender, nondistended. No hepatosplenomegaly. No bruits or masses. Good bowel sounds. Extremities: no cyanosis, clubbing, rash, tr edema Neuro: intubated/sedated  Remains stable from cardiac standpoint. Will need to start CVVHD today for uremia in hopes of getting him extubated and avoid trach. Continue abx. D/w CCM personally. Son updated at bedside.   CRITICAL CARE Performed by: Glori Bickers  Total critical care time: 35 minutes  Critical care time was exclusive of separately billable procedures and treating other patients.  Critical care was necessary to treat or prevent imminent or life-threatening deterioration.  Critical care was time spent personally by me (independent of midlevel providers or residents) on the following activities: development of treatment plan with patient and/or surrogate as well as nursing, discussions with consultants, evaluation of patient's response to treatment, examination of patient, obtaining history from patient or surrogate, ordering and performing treatments and interventions, ordering and review of laboratory studies, ordering and review of radiographic studies, pulse oximetry and re-evaluation of patient's condition.  Glori Bickers, MD  4:50 PM

## 2020-10-24 NOTE — Consult Note (Signed)
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  He has baseline chronic kidney disease stage III with creatinine that ranges 1.3-1.5 based on previous records.  He was readmitted to the hospital on 10/30/2020 with increasing back pain and fatigue and weakness.  After admission for pain management/possible admission to CIR, he suffered acute hypoxic respiratory failure with chest x-ray showing evidence of pneumonia.  After failing NIPPV, he was intubated and seen by cardiology/heart failure service for elevated troponin and congestive heart failure (EF 25-30%) with possible Takotsubo.  He was started on meropenem for his pneumonia with improving markers of infection.  Concern raised with his worsening renal insufficiency and persistent encephalopathy in the setting of worsening azotemia.  He has had some intermittent dosing of furosemide and last dose 120 mg IV at 9:30 AM yesterday.  Exuberant urine output of 5.2 L overnight.    10/20/2020  10/21/2020  10/22/2020  10/23/2020  10/24/2020   Sodium 139 142 148 (H) 151 (H) 150 (H)  BUN 52 (H) 71 (H) 84 (H) 108 (H) 124 (H)  Creatinine 2.18 (H) 2.37 (H) 2.75 (H) 3.49 (H) 3.88 (H)    Past Medical History:  Diagnosis Date  . Anxiety   . Back pain   . CAD (coronary artery disease)    significant CAD by cardiac calcium scoring test  . CKD (chronic kidney disease), stage III ([place])    by serum creatinine is stable  . HTN (hypertension)    pt denies   . Hyperlipidemia   . Microhematuria   . PSA elevation 2014   4.75 February 2017, 6.9 in 2/18 and declines urologist eval or trial of antibiotics    Past Surgical History:  Procedure Laterality Date  . dental implants      3 of them 2008  . LUMBAR LAMINECTOMY/DECOMPRESSION MICRODISCECTOMY Right 04/20/2020   Procedure: Laminectomy and Foraminotomy - right - Lumbar five-Sacral one;  Surgeon: [name], MD;  Location: [facility];  Service: Neurosurgery;  Laterality: Right;  . LUMBAR LAMINECTOMY/DECOMPRESSION MICRODISCECTOMY Right 06/08/2020   Procedure: Right Lumbar Two-Three Laminectomy and Foraminotomy;  Surgeon: [name], MD;  Location: [facility];  Service: Neurosurgery;  Laterality: Right;  . RIGHT HEART CATH N/A 10/30/2020   Procedure: RIGHT HEART CATH;  Surgeon: Adrian Prows, MD;  Location: [facility];  Service: Cardiovascular;  Laterality: N/A;  . submucous resection nose     1956    Family History  Problem Relation Age of Onset  . Colon cancer Neg Hx   . Migraines Neg Hx     Social History:  reports that he has never smoked. He has never used smokeless tobacco. He reports current alcohol use. He reports that he does not use drugs.  Allergies:  Allergies  Allergen Reactions  . Sulfa Antibiotics Hives    Medications:  Scheduled: . aspirin  81 mg Per Tube Daily  . bethanechol  10 mg Per Tube TID  . chlorhexidine gluconate (MEDLINE KIT)  15 mL Mouth Rinse BID  . Chlorhexidine Gluconate Cloth  6 each Topical Daily  . ezetimibe  10 mg Per Tube Daily  . feeding supplement (PROSource TF)  90 mL Per Tube BID  . heparin injection (subcutaneous)  5,000 Units Subcutaneous Q8H  . insulin aspart  0-15  Units Subcutaneous Q4H  . insulin aspart  5 Units Subcutaneous Q4H  . insulin detemir  15 Units Subcutaneous BID  . ipratropium-albuterol  3 mL Nebulization Q6H  . mouth rinse  15 mL Mouth Rinse 10 times per day  . metoCLOPramide (REGLAN) injection  5 mg Intravenous Q8H  . pantoprazole sodium  40 mg Per Tube Q1200  . rosuvastatin  20 mg Per Tube Daily  . sodium chloride flush  10-40 mL Intracatheter Q12H  . sodium chloride flush  10-40 mL Intracatheter Q12H  . sodium chloride flush  3 mL Intravenous  Q12H    BMP Latest Ref Rng & Units 10/24/2020 10/23/2020 10/23/2020  Glucose 70 - 99 mg/dL 288(H) 199(H) 236(H)  BUN 8 - 23 mg/dL 124(H) 108(H) 103(H)  Creatinine 0.61 - 1.24 mg/dL 3.88(H) 3.49(H) 3.31(H)  Sodium 135 - 145 mmol/L 150(H) 151(H) 149(H)  Potassium 3.5 - 5.1 mmol/L 3.9 4.1 5.2(H)  Chloride 98 - 111 mmol/L 110 115(H) 114(H)  CO2 22 - 32 mmol/L 22 20(L) 22  Calcium 8.9 - 10.3 mg/dL 7.8(L) 8.0(L) 7.9(L)   CBC Latest Ref Rng & Units 10/24/2020 10/23/2020 10/22/2020  WBC 4.0 - 10.5 K/uL 12.1(H) 14.0(H) 17.4(H)  Hemoglobin 13.0 - 17.0 g/dL 9.5(L) 7.0(L) 7.6(L)  Hematocrit 39.0 - 52.0 % 30.5(L) 23.8(L) 24.5(L)  Platelets 150 - 400 K/uL 268 228 254     US RENAL  Result Date: 10/22/2020 CLINICAL DATA:  Acute renal injury EXAM: RENAL / URINARY TRACT ULTRASOUND COMPLETE COMPARISON:  None. FINDINGS: Right Kidney: Renal measurements: 9.7 x 4.5 x 4.6 cm. = volume: 105 mL. Minimal perinephric fluid is noted. No mass lesion or hydronephrosis is seen. Left Kidney: Renal measurements: 9.9 x 4.8 x 4.3 cm. = volume: 106 mL. Minimal perinephric fluid is noted. No mass lesion or hydronephrosis is seen. Bladder: Appears normal for degree of bladder distention. Other: None. IMPRESSION: Mild perinephric fluid bilaterally. No obstructive changes are noted. Electronically Signed   By: Inez Catalina M.D.   On: 10/22/2020 15:24   DG Chest Port 1 View  Result Date: 10/24/2020 CLINICAL DATA:  85 year old male central line placement. Respiratory failure. Sepsis. EXAM: PORTABLE CHEST 1 VIEW COMPARISON:  Portable chest 0518 hours today. FINDINGS: Portable AP semi upright view at 0856 hours. Stable endotracheal and enteric tube. Stable left upper extremity approach PICC line. Right IJ central line placed. Tip at the SVC level just above the carina. No pneumothorax. Mediastinal contours remain normal. Stable bilateral ventilation. IMPRESSION: 1. Right IJ central line placed, tip at the SVC level with no adverse  features. 2. Otherwise stable lines and tubes, stable ventilation. Electronically Signed   By: Genevie Ann M.D.   On: 10/24/2020 09:10   DG Chest Port 1 View  Result Date: 10/24/2020 CLINICAL DATA:  Hypoxia EXAM: PORTABLE CHEST 1 VIEW COMPARISON:  October 21, 2020 FINDINGS: Endotracheal tube tip is 4.2 cm above the carina. Central catheter tip is in the superior vena cava. Nasogastric tube tip and side port are below the diaphragm. No pneumothorax. There is persistent ill-defined opacity in the lung bases, more on the right than the left. The lungs elsewhere are clear. Heart size and pulmonary vascularity normal. No adenopathy. No bone lesions. IMPRESSION: Tube and catheter positions as described without pneumothorax. Ill-defined opacity persists in the lung bases, more on the right than on the left. Suspect bibasilar pneumonia, although a degree of pulmonary edema could present in this manner. No new opacity evident. Stable cardiac silhouette. Electronically Signed  By: Lowella Grip III M.D.   On: 10/24/2020 08:02    Review of Systems  Unable to perform ROS: Intubated   Blood pressure (!) 117/56, pulse 94, temperature 99 F (37.2 C), temperature source Oral, resp. rate (!) 29, height 5' 7" (1.702 m), weight 69.3 kg, SpO2 98 %. Physical Exam Vitals and nursing note reviewed.  Constitutional:      Appearance: He is ill-appearing.     Comments: Intubated, sedated  HENT:     Head: Normocephalic and atraumatic.     Right Ear: External ear normal.     Left Ear: External ear normal.     Nose: Nose normal.  Eyes:     Conjunctiva/sclera: Conjunctivae normal.     Pupils: Pupils are equal, round, and reactive to light.  Cardiovascular:     Rate and Rhythm: Normal rate and regular rhythm.     Heart sounds: Normal heart sounds. No murmur heard. No friction rub.  Pulmonary:     Effort: Pulmonary effort is normal.     Breath sounds: Normal breath sounds. No wheezing or rhonchi.  Abdominal:      General: Abdomen is flat. Bowel sounds are normal.     Palpations: Abdomen is soft.     Tenderness: There is no abdominal tenderness.  Musculoskeletal:     Cervical back: Normal range of motion and neck supple.     Right lower leg: No edema.     Left lower leg: No edema.     Comments: 1+ upper extremity edema, trace dependent edema  Skin:    General: Skin is warm and dry.     Assessment/Plan: 1.  Acute kidney injury on chronic kidney disease stage III: Likely hemodynamically mediated with CHF exacerbation/ongoing diuresis with evolution to mild ATN.  Significant hypernatremia noted-we will obtain urine electrolytes as I suspect that he might have intravascular volume contraction and free water deficit.  Will order for hemodialysis today for management of severe azotemia as this might be translating to uremic encephalopathy.  Right IJ hemodialysis catheter placed by critical care earlier. 2.  Hypernatremia: Secondary to free water deficit in this intubated patient with increased insensible losses and ongoing diuresis.  Will monitor cautiously with dialysis and will begin free water flushes with ongoing tube feeds. 3.  Anemia: Likely secondary to acute illness and possibly hemoconcentrated with intravascular volume contraction/ongoing diuresis-no overt loss and transfused 1 unit on 12/13. 4.  Encephalopathy: Suspected to be multifactorial but primarily from severe azotemia.  Will monitor with hemodialysis. 5.  Acute hypoxic respiratory failure: Suspected multifactorial from HCAP and acute pulmonary edema from CHF exacerbation.  Appears to have been diuresed satisfactorily, ventilator settings per critical care.  , K. 10/24/2020, 11:45 AM

## 2020-10-24 NOTE — Progress Notes (Signed)
Nutrition Follow-up  DOCUMENTATION CODES:   Non-severe (moderate) malnutrition in context of chronic illness  INTERVENTION:   Tube Feeding via OG:  Vital 1.5 at 55 ml/hr Pro-Source 45 mL TF BID Provides 111 g of protein, 2060 kcals, 1003 mL of free water Meets 100% estimated calorie and protein needs   NUTRITION DIAGNOSIS:   Moderate Malnutrition related to chronic illness as evidenced by moderate fat depletion,severe muscle depletion,moderate muscle depletion.  Being addressed via TF   GOAL:   Patient will meet greater than or equal to 90% of their needs  Progressing  MONITOR:   TF tolerance,Labs  REASON FOR ASSESSMENT:   Consult Enteral/tube feeding initiation and management  ASSESSMENT:   Pt with PMH of HTN, HLD, stage III CKD, CAD, who recently was admitted 09/29/20 for L5-S1 decompression and fusion and discharged home now readmitted for poorly controlled back and bilateral lower extremity pain and PNA.   12/1 admission 12/4 intubated possibly from flash pulmonary edema; OG tube tip in stomach  12/5 TF started per protocol 12/08 Extubated, but required re-intubation. 12/10 TF held; KUB showed ileus; reglan initiated  Plan for iHD today Pt remains on vent support, encephalopathy continues off sedation Propofol: OFF  Vital 1.5 at 45 ml/hr, Pro-Source TF 90 mL BID  Hypernatremic, Free water flush of 200 mL q 4 hours started today  Current wt 69.3 kg; admit weight 70 kg. Net negative 7 L, Highest wt 76 kg  Phosphorus elevated but should improve with iHD; potassium wdl  Labs: sodium 150 (H), BUN 124 (H), phosphorus 8.2 (H) Meds: reglan   Diet Order:   Diet Order            Diet NPO time specified  Diet effective now                 EDUCATION NEEDS:   No education needs have been identified at this time  Skin:  Skin Assessment: Reviewed RN Assessment (closed incision to back)  Last BM:  12/15 rectal tube  Height:   Ht Readings from Last  1 Encounters:  10/12/20 5\' 7"  (1.702 m)    Weight:   Wt Readings from Last 1 Encounters:  10/24/20 69.3 kg    BMI:  Body mass index is 23.93 kg/m.  Estimated Nutritional Needs:   Kcal:  2060  Protein:  105-120 grams  Fluid:  >1.8 L/day   Kerman Passey MS, RDN, LDN, CNSC Registered Dietitian III Clinical Nutrition RD Pager and On-Call Pager Number Located in [place]

## 2020-10-24 NOTE — Progress Notes (Signed)
Patient resting in bed near end of shift. Patient starting to make slight responses to body moment with touch. Patient responds to pain by wincing eyebrows and will close mouth with suction. Patient Rass -4. No eye opening but pupils equal and reactive, no ability to follow commands. Patient continues on ventilation PRVC with FiO2 40% Peep 8 and RR 20. Longs diminished. ABD taut and distended, stopped tube feeds at 0400 due to high residual volume. U/O 1.375L, 250 cc BM vial flexi seal. Patient bathed and CHG performed. Plan for this patient is to monitor kidney function, Lasis being given and possible CRRT to be done. New dressing place to lumbar surgical site. Will continue to monitor at this time and SBARR to day shift nurse at change of shift.

## 2020-10-24 NOTE — Plan of Care (Signed)
  Problem: Education: Goal: Knowledge of General Education information will improve Description: Including pain rating scale, medication(s)/side effects and non-pharmacologic comfort measures Outcome: Progressing   Problem: Health Behavior/Discharge Planning: Goal: Ability to manage health-related needs will improve Outcome: Progressing   Problem: Clinical Measurements: Goal: Ability to maintain clinical measurements within normal limits will improve Outcome: Progressing Goal: Will remain free from infection Outcome: Progressing Goal: Diagnostic test results will improve Outcome: Progressing Goal: Respiratory complications will improve Outcome: Progressing Goal: Cardiovascular complication will be avoided Outcome: Progressing   Problem: Activity: Goal: Risk for activity intolerance will decrease Outcome: Progressing   Problem: Nutrition: Goal: Adequate nutrition will be maintained Outcome: Progressing   Problem: Coping: Goal: Level of anxiety will decrease Outcome: Progressing   Problem: Elimination: Goal: Will not experience complications related to bowel motility Outcome: Progressing Goal: Will not experience complications related to urinary retention Outcome: Progressing   Problem: Pain Managment: Goal: General experience of comfort will improve Outcome: Progressing   Problem: Safety: Goal: Ability to remain free from injury will improve Outcome: Progressing   Problem: Skin Integrity: Goal: Risk for impaired skin integrity will decrease Outcome: Progressing   Problem: Activity: Goal: Ability to tolerate increased activity will improve Outcome: Progressing   Problem: Respiratory: Goal: Ability to maintain a clear airway and adequate ventilation will improve Outcome: Progressing   Problem: Role Relationship: Goal: Method of communication will improve Outcome: Progressing   Problem: Education: Goal: Understanding of CV disease, CV risk reduction, and  recovery process will improve Outcome: Progressing Goal: Individualized Educational Video(s) Outcome: Progressing   Problem: Activity: Goal: Ability to return to baseline activity level will improve Outcome: Progressing   Problem: Cardiovascular: Goal: Ability to achieve and maintain adequate cardiovascular perfusion will improve Outcome: Progressing Goal: Vascular access site(s) Level 0-1 will be maintained Outcome: Progressing   Problem: Health Behavior/Discharge Planning: Goal: Ability to safely manage health-related needs after discharge will improve Outcome: Progressing   

## 2020-10-24 NOTE — Progress Notes (Signed)
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 He had no operative complications and was discharged home. He returned to [facility] 10 days post-op due to fatigue, weakness, and poorly controlled back pain. He was also found to have sick contact and he was admit to neurosurgery service for pain control, IV hydration and possible dispo to CIR.   Past Medical History  CAD, CKD3, HTN, [facility] Events   11/04/2020 Admisson 12/10 intubated for hypoxia this morning, increased work of breathing.  Seem to respond nicely to titration of PEEP.  Changed sedation to propofol and fentanyl.  Broad-spectrum antibiotics continued.  Primary barriers to progress seem to be atelectasis, adynamic ileus, resolving pneumonia, and severe deconditioning all superimposed on his underlying stress cardiomyopathy   Consults:  Neurosurgery Cardiology  Procedures:  10/13/20 Intubation 12/5 bronch 12/15 > R IJ HD cath  Significant Diagnostic Tests:  10/11/20 Chest X-ray, minimal left basilar atelectasis 12/3 LE venous doppler: neg 12/4 echo: LVEF 25-30% 12/10 KUB: Consistent with colonic ileus-gas present down to rectum 12/13 Renal US > mild perinephric fluid, no obstruction.  Micro Data:  10/14/2020 COVID, Flu neg 12/4:  MRSA PCR neg 12/4 Bronch tracheal aspirate - GNR, GPC growing 12/4 blood: ngtd  Antimicrobials:  Vanc 12/4 > 12/13 Cefepime 12/4 > 12/10 Doxycycline 12/6 > 2/9 Merrem 12/10 >   Interim history/subjective:  Renal function worse despite great UOP after lasix challenge 12/14. HD cath planned for iHD vs CRRT. Mental status  gradually improving but still not alert to follow commands  Objective   Blood pressure 128/67, pulse 98, temperature 99 F (37.2 C), temperature source Oral, resp. rate (!) 31, height 5\' 7"  (1.702 m), weight 69.3 kg, SpO2 98 %. CVP:  [3 mmHg-10 mmHg] 8 mmHg  Vent Mode: PSV;CPAP FiO2 (%):  [40 %] 40 % Set Rate:  [20 bmp] 20 bmp Vt Set:  [520 mL] 520 mL PEEP:  [8 cmH20] 8 cmH20 Pressure Support:  [8 cmH20] 8 cmH20 Plateau Pressure:  [20 cmH20-23 cmH20] 20 cmH20   Intake/Output Summary (Last 24 hours) at 10/24/2020 0923 Last data filed at 10/24/2020 0800 Gross per 24 hour  Intake 1875.13 ml  Output 5725 ml  Net -3849.87 ml   Filed Weights   10/22/20 0500 10/23/20 0500 10/24/20 0500  Weight: 69.7 kg 68.5 kg 69.3 kg   Physical Exam: General: Elderly male, resting in bed, in NAD. Neuro: mental status gradually improving but still encephalopathic. HEENT: [place]/AT. Sclerae anicteric. ETT in place. Cardiovascular: RRR, no M/R/G.  Lungs: Respirations even and unlabored.  CTA bilaterally, No W/R/R. Abdomen: BS x 4, soft, NT/ND.  Musculoskeletal: No gross deformities, no edema.  Skin: Intact, warm, no rashes.  Assessment & Plan:   L5-S1 DJD s/p laminectomy/decompression - Per neurosurgery.  Acute hypoxic respiratory failure due to acute pulm edema and likely aspiration pneumonitis vs HCAP (s/p 10 days vanc, currently on 7 days merrem). - Continue vent support. - Continue daily SBT but not a candidate for extubation trial until mental status improves.  If no improvement in mental status, will need to consider trach over the next few days. -  Continue merrem x 7 days total (stop date 12/16, felt to have improved after starting, possibly 2/2 better coverage of gut translocation). - Follow CXR.  Acute encephalopathy - gradually improving, hopefully will see more improvement as uremia clears with iHD.  Complicated by slowed med clearance in setting AKI. Hypoactive delirium. - Hold all  sedation as able. - Nephrology consulted for iHD vs CRRT.  Adynamic ileus - Continue Reglan.  A.flutter with RVR after diuresis - improved with amio. 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 - Continue amio.  AKI due to ATN - renal US 12/13 with mild fluid and no obstruction.  Great UOP with lasix challenge; however, SCr, BUN, Na climbing. - Hold further lasix. - Nephrology consulted for iHD vs CRRT. - Follow BMP and UOP.  Hypernatremia - 2/2 albumin + lasix. - D/c albumin, lasix.  Hyperkalemia - resolved. - Supportive care.  Type 2 diabetes. - SSI, levemir.  Anemia of acute illness - Hgb drop to 6.9 overnight 12/13, s/p 1u PRBC. - Transfuse for Hgb < 7.  Generalized deconditioning. - PT / OT when able.   Best practice (evaluated daily)  Diet: TF Pain/Anxiety/Delirium protocol (if indicated): Hold all sedation. VAP protocol (if indicated): ordered DVT prophylaxis: heparin twice daily GI prophylaxis: PPI Glucose control: SSI, levemir Mobility: BR Code Status: Full Communication: Son updated at bedside 12/15. Disposition: ICU   CC time: 30 min.   Montey Hora, [place] [facility] 10/24/2020, 9:23 AM

## 2020-10-25 ENCOUNTER — Inpatient Hospital Stay (HOSPITAL_COMMUNITY): Payer: Medicare Other

## 2020-10-25 LAB — CBC
HCT: 28.5 % — ABNORMAL LOW (ref 39.0–52.0)
Hemoglobin: 9.3 g/dL — ABNORMAL LOW (ref 13.0–17.0)
MCH: 29.6 pg (ref 26.0–34.0)
MCHC: 32.6 g/dL (ref 30.0–36.0)
MCV: 90.8 fL (ref 80.0–100.0)
Platelets: 251 10*3/uL (ref 150–400)
RBC: 3.14 MIL/uL — ABNORMAL LOW (ref 4.22–5.81)
RDW: 14.4 % (ref 11.5–15.5)
WBC: 12.3 10*3/uL — ABNORMAL HIGH (ref 4.0–10.5)
nRBC: 0 % (ref 0.0–0.2)

## 2020-10-25 LAB — GLUCOSE, CAPILLARY
Glucose-Capillary: 163 mg/dL — ABNORMAL HIGH (ref 70–99)
Glucose-Capillary: 202 mg/dL — ABNORMAL HIGH (ref 70–99)
Glucose-Capillary: 219 mg/dL — ABNORMAL HIGH (ref 70–99)
Glucose-Capillary: 229 mg/dL — ABNORMAL HIGH (ref 70–99)
Glucose-Capillary: 236 mg/dL — ABNORMAL HIGH (ref 70–99)
Glucose-Capillary: 253 mg/dL — ABNORMAL HIGH (ref 70–99)

## 2020-10-25 LAB — COOXEMETRY PANEL
Carboxyhemoglobin: 1 % (ref 0.5–1.5)
Methemoglobin: 0.8 % (ref 0.0–1.5)
O2 Saturation: 70.7 %
Total hemoglobin: 10.7 g/dL — ABNORMAL LOW (ref 12.0–16.0)

## 2020-10-25 LAB — PHOSPHORUS: Phosphorus: 6.5 mg/dL — ABNORMAL HIGH (ref 2.5–4.6)

## 2020-10-25 LAB — BASIC METABOLIC PANEL
Anion gap: 15 (ref 5–15)
BUN: 97 mg/dL — ABNORMAL HIGH (ref 8–23)
CO2: 24 mmol/L (ref 22–32)
Calcium: 7.7 mg/dL — ABNORMAL LOW (ref 8.9–10.3)
Chloride: 107 mmol/L (ref 98–111)
Creatinine, Ser: 3.23 mg/dL — ABNORMAL HIGH (ref 0.61–1.24)
GFR, Estimated: 18 mL/min — ABNORMAL LOW (ref >60)
Glucose, Bld: 231 mg/dL — ABNORMAL HIGH (ref 70–99)
Potassium: 4.2 mmol/L (ref 3.5–5.1)
Sodium: 146 mmol/L — ABNORMAL HIGH (ref 135–145)

## 2020-10-25 LAB — HEPATITIS B CORE ANTIBODY, TOTAL: Hep B Core Total Ab: NONREACTIVE

## 2020-10-25 LAB — MAGNESIUM: Magnesium: 2.5 mg/dL — ABNORMAL HIGH (ref 1.7–2.4)

## 2020-10-25 LAB — TRIGLYCERIDES: Triglycerides: 64 mg/dL (ref <150)

## 2020-10-25 MED ORDER — AMIODARONE HCL 200 MG PO TABS
200.0000 mg | ORAL_TABLET | Freq: Two times a day (BID) | ORAL | Status: DC
  Administered 2020-10-25 – 2020-10-26 (×3): 200 mg via ORAL
  Filled 2020-10-25 (×3): qty 1

## 2020-10-25 MED ORDER — INSULIN DETEMIR 100 UNIT/ML ~~LOC~~ SOLN
20.0000 [IU] | Freq: Two times a day (BID) | SUBCUTANEOUS | Status: DC
  Administered 2020-10-25 – 2020-10-28 (×8): 20 [IU] via SUBCUTANEOUS
  Filled 2020-10-25 (×10): qty 0.2

## 2020-10-25 NOTE — Progress Notes (Signed)
Patient ID: Dennis Carey, male   DOB: Mar 21, 1935, 85 y.o.   MRN: [id number] [facility] Progress Note   Assessment/ Plan:   1.  Acute kidney injury on chronic kidney disease stage III: Likely hemodynamically mediated with CHF exacerbation/ongoing diuresis with evolution to mild ATN.    Underwent hemodialysis overnight for management of severe azotemia that was thought to be likely culpable for his encephalopathy.  Will order for additional hemodialysis later today for clearance.  He is nonoliguric and anticipated to have good renal recovery as the underlying process improves. 2.  Hypernatremia: Secondary to free water deficit in this intubated patient with increased insensible losses and ongoing diuresis.    Started on free water flushes yesterday and sodium level trending down.   3.  Anemia: Likely secondary to acute illness and possibly hemoconcentrated with intravascular volume contraction/ongoing diuresis-no overt loss and transfused 1 unit on 12/13. 4.  Encephalopathy: Suspected to be multifactorial but primarily from severe azotemia-continue hemodialysis. 5.  Acute hypoxic respiratory failure: Suspected multifactorial from HCAP and acute pulmonary edema from CHF exacerbation.  Appears to have been diuresed satisfactorily, ventilator settings per critical care.  Subjective:   Without acute clinical events overnight.  Had intradialytic hypotension prompting earlier termination of treatment.  Daughter Opal Sidles) at bedside updated of clinical status/progress and plan.   Objective:   BP (!) 153/71 (BP Location: Right Leg)   Pulse 94   Temp 99 F (37.2 C) (Oral)   Resp (!) 28   Ht $R'5\' 7"'aV$  (1.702 m)   Wt 66 kg   SpO2 99%   BMI 22.79 kg/m   Intake/Output Summary (Last 24 hours) at 10/25/2020 0730 Last data filed at 10/25/2020 0600 Gross per 24 hour  Intake 3154.28 ml  Output 1711 ml  Net 1443.28 ml   Weight change: 0.2 kg  Physical Exam: Gen: Intubated, unresponsive CVS:  Pulse regular rhythm, normal rate, S1 and S2 normal without any murmurs, rub or gallop Resp: Anteriorly clear to auscultation, no distinct rales or rhonchi Abd: Soft, flat, nontender Ext: 1+ upper extremity edema, trace dependent edema.  No lower extremity edema.  Imaging: DG Chest Port 1 View  Result Date: 10/24/2020 CLINICAL DATA:  85 year old male central line placement. Respiratory failure. Sepsis. EXAM: PORTABLE CHEST 1 VIEW COMPARISON:  Portable chest 0518 hours today. FINDINGS: Portable AP semi upright view at 0856 hours. Stable endotracheal and enteric tube. Stable left upper extremity approach PICC line. Right IJ central line placed. Tip at the SVC level just above the carina. No pneumothorax. Mediastinal contours remain normal. Stable bilateral ventilation. IMPRESSION: 1. Right IJ central line placed, tip at the SVC level with no adverse features. 2. Otherwise stable lines and tubes, stable ventilation. Electronically Signed   By: Genevie Ann M.D.   On: 10/24/2020 09:10   DG Chest Port 1 View  Result Date: 10/24/2020 CLINICAL DATA:  Hypoxia EXAM: PORTABLE CHEST 1 VIEW COMPARISON:  October 21, 2020 FINDINGS: Endotracheal tube tip is 4.2 cm above the carina. Central catheter tip is in the superior vena cava. Nasogastric tube tip and side port are below the diaphragm. No pneumothorax. There is persistent ill-defined opacity in the lung bases, more on the right than the left. The lungs elsewhere are clear. Heart size and pulmonary vascularity normal. No adenopathy. No bone lesions. IMPRESSION: Tube and catheter positions as described without pneumothorax. Ill-defined opacity persists in the lung bases, more on the right than on the left. Suspect bibasilar pneumonia, although a degree of  pulmonary edema could present in this manner. No new opacity evident. Stable cardiac silhouette. Electronically Signed   By: Lowella Grip III M.D.   On: 10/24/2020 08:02    Labs: BMET Recent Labs  Lab  10/19/20 0324 10/20/20 0233 10/20/20 1433 10/21/20 0500 10/21/20 0612 10/22/20 0641 10/23/20 0324 10/23/20 1415 10/24/20 0440 10/25/20 0422  NA 141 139 138 142 144 148* 149* 151* 150* 146*  K 3.9 4.7 4.1 4.1 3.9 4.4 5.2* 4.1 3.9 4.2  CL 108 109 108 112*  --  113* 114* 115* 110 107  CO2 22 19* 19* 18*  --  20* 22 20* 22 24  GLUCOSE 134* 235* 117* 138*  --  212* 236* 199* 288* 231*  BUN 32* 52* 60* 71*  --  84* 103* 108* 124* 97*  CREATININE 1.40* 2.18* 2.03* 2.37*  --  2.75* 3.31* 3.49* 3.88* 3.23*  CALCIUM 7.5* 7.7* 8.3* 7.8*  --  7.5* 7.9* 8.0* 7.8* 7.7*  PHOS 3.8 5.9*  --  5.5*  --  6.4* 8.4*  --  8.2* 6.5*   CBC Recent Labs  Lab 10/22/20 0641 10/23/20 0324 10/24/20 0440 10/25/20 0422  WBC 17.4* 14.0* 12.1* 12.3*  HGB 7.6* 7.0* 9.5* 9.3*  HCT 24.5* 23.8* 30.5* 28.5*  MCV 94.2 97.5 93.6 90.8  PLT 254 228 268 251    Medications:    . aspirin  81 mg Per Tube Daily  . bethanechol  10 mg Per Tube TID  . chlorhexidine gluconate (MEDLINE KIT)  15 mL Mouth Rinse BID  . Chlorhexidine Gluconate Cloth  6 each Topical Daily  . ezetimibe  10 mg Per Tube Daily  . feeding supplement (PROSource TF)  45 mL Per Tube BID  . free water  200 mL Per Tube Q4H  . heparin injection (subcutaneous)  5,000 Units Subcutaneous Q8H  . insulin aspart  0-15 Units Subcutaneous Q4H  . insulin aspart  5 Units Subcutaneous Q4H  . insulin detemir  15 Units Subcutaneous BID  . ipratropium-albuterol  3 mL Nebulization Q6H  . mouth rinse  15 mL Mouth Rinse 10 times per day  . metoCLOPramide (REGLAN) injection  5 mg Intravenous Q8H  . pantoprazole sodium  40 mg Per Tube Q1200  . rosuvastatin  20 mg Per Tube Daily  . sodium chloride flush  10-40 mL Intracatheter Q12H  . sodium chloride flush  10-40 mL Intracatheter Q12H  . sodium chloride flush  3 mL Intravenous Q12H   Elmarie Shiley, MD 10/25/2020, 7:30 AM

## 2020-10-25 NOTE — Progress Notes (Signed)
[place] Progress Note Patient Name: Dennis Carey DOB: 13-Feb-1935 MRN: [id number]   Date of Service  10/25/2020  HPI/Events of Note  Slight discrepancy between A-line and NIBP but MAPs correlate. Suspect A-line is underdampened. By NIBP, the patient is very mildly hypertensive in the SBP 150s.   eICU Interventions  Would not treat this mild elevation of SBP especially in setting of recent need for vasopressors in last 36 hours. Continue to monitor BP.     Intervention Category Intermediate Interventions: Hypertension - evaluation and management  Marily Lente Marithza Malachi 10/25/2020, 6:29 AM

## 2020-10-25 NOTE — Plan of Care (Signed)
  Problem: Education: Goal: Knowledge of General Education information will improve Description: Including pain rating scale, medication(s)/side effects and non-pharmacologic comfort measures Outcome: Progressing   Problem: Clinical Measurements: Goal: Will remain free from infection Outcome: Progressing   

## 2020-10-25 NOTE — Progress Notes (Signed)
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  Seem to respond nicely to titration of PEEP.  Changed sedation to propofol and fentanyl.  Broad-spectrum antibiotics continued.  Primary barriers to progress seem to be atelectasis, adynamic ileus, resolving pneumonia, and severe deconditioning all superimposed on his underlying stress cardiomyopathy   Consults:  Neurosurgery Cardiology  Procedures:  10/13/20 Intubation 12/5 bronch 12/15 > R IJ HD cath  Significant Diagnostic Tests:  10/11/20 Chest X-ray, minimal left basilar atelectasis 12/3 LE venous doppler: neg 12/4 echo: LVEF 25-30% 12/10 KUB: Consistent with colonic ileus-gas present down to rectum 12/13 Renal US > mild perinephric fluid, no obstruction.  Micro Data:  10/11/2020 COVID, Flu neg 12/4:  MRSA PCR neg 12/4 Bronch tracheal aspirate - GNR, GPC growing 12/4 blood: ngtd  Antimicrobials:  Vanc 12/4 > 12/13 Cefepime 12/4 > 12/10 Doxycycline 12/6 > 2/9 Merrem 12/10 > 12/16  Interim history/subjective:  Patient received first session of hemodialysis yesterday with clearance of BUN, continue to remain encephalopathic, next section of hemodialysis is scheduled for today  Objective   Blood pressure (!) 153/67, pulse 97, temperature 98.1 F (36.7 C), temperature source Oral, resp. rate (!) 32, height 5\' 7"  (1.702 m), weight 66 kg, SpO2 97 %. CVP:  [1 mmHg-5 mmHg] 5 mmHg  Vent Mode: PSV;CPAP FiO2 (%):  [40 %] 40 % Set Rate:  [20 bmp] 20 bmp Vt Set:  [520 mL] 520  mL PEEP:  [8 cmH20] 8 cmH20 Pressure Support:  [8 cmH20] 8 cmH20 Plateau Pressure:  [13 cmH20-14 cmH20] 13 cmH20   Intake/Output Summary (Last 24 hours) at 10/25/2020 1024 Last data filed at 10/25/2020 0800 Gross per 24 hour  Intake 3262.27 ml  Output 1511 ml  Net 1751.27 ml   Filed Weights   10/24/20 0500 10/25/20 0016 10/25/20 0455  Weight: 69.3 kg 69.5 kg 66 kg   Physical Exam: General: Elderly male, lying supine in the bed, orally intubated Neuro: Eyes closed, tries to open eyes with vocal stimuli, following simple commands like sticking out tongue, not moving any extremity.   HEENT: [place]/AT. Sclerae anicteric. ETT/OG in place. Cardiovascular: RRR, no M/R/G.  Lungs: Respirations even and unlabored.  CTA bilaterally, No W/R/R. Abdomen: BS x 4, soft, NT/ND.  Musculoskeletal: No gross deformities, no edema.  Skin: Intact, warm, no rashes.  Assessment & Plan:   L5-S1 DJD s/p laminectomy/decompression Per neurosurgery.  Acute hypoxic respiratory failure due to acute pulm edema and likely aspiration pneumonitis vs HCAP (s/p 10 days vanc and 7 days merrem). Continue lung protective ventilation Patient has been tolerating daily pressure support trial but moving his mental status preclude extubation He completed therapy with vancomycin and meropenem Cultures have been negative so far He is off sedation with RASS goal of 0/-1  Acute metabolic encephalopathy Gradually improving, hopefully will see more improvement as uremia clears with iHD. Complicated by slowed med clearance in setting AKI. Hypoactive delirium. Hold all sedation  AKI due to ATN Obstruction was ruled out by negative venous ultrasound He continued to make good amount  of urine His BUN and creatinine continue to climb up we will Started on hemodialysis to clear BUN to see if that can help with clearing mental status Monitor serum creatinine  Hypernatremia - 2/2 albumin + lasix. Continue free water Serum sodium  is improving  Type 2 diabetes, well controlled Continue SSI, levemir.  Anemia of acute illness Has received blood transfusion couple of times during this admission H&H is stable now Transfuse for Hgb < 7.  Generalized deconditioning. - PT / OT when able.  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 Continue amio.  Best practice (evaluated daily)  Diet: TF Pain/Anxiety/Delirium protocol (if indicated): Hold all sedation. VAP protocol (if indicated): ordered DVT prophylaxis: Subcu heparin GI prophylaxis: PPI Glucose control: SSI, levemir Mobility: BR Code Status: Full Communication: Son updated at bedside 12/15. Disposition: ICU   Total critical care time: 32 minutes  Performed by: Sells care time was exclusive of separately billable procedures and treating other patients.   Critical care was necessary to treat or prevent imminent or life-threatening deterioration.   Critical care was time spent personally by me on the following activities: development of treatment plan with patient and/or surrogate as well as nursing, discussions with consultants, evaluation of patient's response to treatment, examination of patient, obtaining history from patient or surrogate, ordering and performing treatments and interventions, ordering and review of laboratory studies, ordering and review of radiographic studies, pulse oximetry and re-evaluation of patient's condition.   Jacky Kindle MD [facility] Pager: [phone] Mobile: [phone]

## 2020-10-25 NOTE — Progress Notes (Signed)
Providing Compassionate, Quality Care - Together   Subjective: Daughter and nurse are at the bedside. Nurse reports that patient started HD last night. Sedation has been off for three days with patient slow to wake up. Patient has been weaning on the ventilator.  Objective: Vital signs in last 24 hours: Temp:  [98.1 F (36.7 C)-99.9 F (37.7 C)] 98.5 F (36.9 C) (12/16 1117) Pulse Rate:  [92-113] 97 (12/16 1100) Resp:  [27-36] 33 (12/16 1100) BP: (98-162)/(48-71) 151/65 (12/16 1100) SpO2:  [96 %-100 %] 97 % (12/16 1100) Arterial Line BP: (132-186)/(49-90) 161/64 (12/16 1100) FiO2 (%):  [40 %] 40 % (12/16 1029) Weight:  [66 kg-69.5 kg] 66 kg (12/16 0455)  Intake/Output from previous day: 12/15 0701 - 12/16 0700 In: 3154.3 [I.V.:421.2; NG/GT:2533; IV Piggyback:200.1] Out: 1711 [Urine:1815; Stool:310] Intake/Output this shift: Total I/O In: 108 [I.V.:53; NG/GT:55] Out: 150 [Urine:150]  Raises eyebrows to voice, but no eye opening Intubated Unable to follow commands  Lab Results: Recent Labs    10/24/20 0440 10/25/20 0422  WBC 12.1* 12.3*  HGB 9.5* 9.3*  HCT 30.5* 28.5*  PLT 268 251   BMET Recent Labs    10/24/20 0440 10/25/20 0422  NA 150* 146*  K 3.9 4.2  CL 110 107  CO2 22 24  GLUCOSE 288* 231*  BUN 124* 97*  CREATININE 3.88* 3.23*  CALCIUM 7.8* 7.7*    Studies/Results: DG Chest Port 1 View  Result Date: 10/25/2020 CLINICAL DATA:  Intubation. EXAM: PORTABLE CHEST 1 VIEW COMPARISON:  10/24/2020. FINDINGS: Endotracheal tube, NG tube, left PICC line, right IJ line in stable position. Heart size normal. Bilateral interstitial infiltrates unchanged. Tiny right pleural effusion cannot be excluded. No pneumothorax. Left costophrenic angle incompletely imaged. IMPRESSION: 1. Lines and tubes in stable position. 2. Bilateral interstitial infiltrates unchanged. Tiny right pleural effusion cannot be excluded. Electronically Signed   By: Marcello Moores  Register   On:  10/25/2020 07:55   DG Chest Port 1 View  Result Date: 10/24/2020 CLINICAL DATA:  85 year old male central line placement. Respiratory failure. Sepsis. EXAM: PORTABLE CHEST 1 VIEW COMPARISON:  Portable chest 0518 hours today. FINDINGS: Portable AP semi upright view at 0856 hours. Stable endotracheal and enteric tube. Stable left upper extremity approach PICC line. Right IJ central line placed. Tip at the SVC level just above the carina. No pneumothorax. Mediastinal contours remain normal. Stable bilateral ventilation. IMPRESSION: 1. Right IJ central line placed, tip at the SVC level with no adverse features. 2. Otherwise stable lines and tubes, stable ventilation. Electronically Signed   By: Genevie Ann M.D.   On: 10/24/2020 09:10   DG Chest Port 1 View  Result Date: 10/24/2020 CLINICAL DATA:  Hypoxia EXAM: PORTABLE CHEST 1 VIEW COMPARISON:  October 21, 2020 FINDINGS: Endotracheal tube tip is 4.2 cm above the carina. Central catheter tip is in the superior vena cava. Nasogastric tube tip and side port are below the diaphragm. No pneumothorax. There is persistent ill-defined opacity in the lung bases, more on the right than the left. The lungs elsewhere are clear. Heart size and pulmonary vascularity normal. No adenopathy. No bone lesions. IMPRESSION: Tube and catheter positions as described without pneumothorax. Ill-defined opacity persists in the lung bases, more on the right than on the left. Suspect bibasilar pneumonia, although a degree of pulmonary edema could present in this manner. No new opacity evident. Stable cardiac silhouette. Electronically Signed   By: Lowella Grip III M.D.   On: 10/24/2020 08:02    Assessment/Plan:  Patient admitted with pneumonia. He subsequently developed respiratory and renal failure. He is gradually improving on the ventilator and seems to be responding positively to the HD started yesterday evening. [facility], CCM, and Nephrology's input with this  patient's care.   LOS: 15 days    Viona Gilmore, DNP, AGNP-C Nurse Practitioner  [facility] [place] [address], [address], [place], [place] [address] P: [phone]    F: [phone]  10/25/2020, 11:48 AM

## 2020-10-25 NOTE — Progress Notes (Signed)
Treatment ended about 15 minutes early due to low BP. Arterial pressure was bottoming reversed lines to resolve.  A line blood pressure as well as cuff systolic pressure fell below 100. Gave 200 saline bolus x 2 to resolve . BP did not come up until after completely rinsed back. Floor RN aware.

## 2020-10-25 NOTE — Progress Notes (Signed)
Patient ID: Dennis Carey, male   DOB: 09/17/1935, 85 y.o.   MRN: [id number]     Advanced Heart Failure Rounding Note   Subjective:    12/7 Bronch with copious secretions that were removed.  12/8 Extubated but required re-intubation.  12/15 started HD  Remains intubated/sedated. Started HD last night. Sedation off x 3 days. Still not waking up much. Will respond to pain.   CXR much improved. Off pressors. Co-ox 71%  Has completed abx.   Objective:   Weight Range:  Vital Signs:   Temp:  [98.1 F (36.7 C)-99.9 F (37.7 C)] 98.5 F (36.9 C) (12/16 1117) Pulse Rate:  [92-113] 95 (12/16 1029) Resp:  [27-36] 31 (12/16 1029) BP: (98-162)/(48-71) 162/64 (12/16 1029) SpO2:  [96 %-100 %] 97 % (12/16 1029) Arterial Line BP: (132-186)/(49-90) 171/63 (12/16 0900) FiO2 (%):  [40 %] 40 % (12/16 1029) Weight:  [66 kg-69.5 kg] 66 kg (12/16 0455) Last BM Date: 10/24/20  Weight change: Filed Weights   10/24/20 0500 10/25/20 0016 10/25/20 0455  Weight: 69.3 kg 69.5 kg 66 kg    Intake/Output:   Intake/Output Summary (Last 24 hours) at 10/25/2020 1135 Last data filed at 10/25/2020 0800 Gross per 24 hour  Intake 3262.27 ml  Output 1511 ml  Net 1751.27 ml     PHYSICAL EXAM: General:  Intubated. NAD HEENT: normal +ETT and cortrak Neck: supple.RIJ trialysisCarotids 2+ bilat; no bruits. No lymphadenopathy or thryomegaly appreciated. Cor: PMI nondisplaced. Regular rate & rhythm. No rubs, gallops or murmurs. Lungs: clear Abdomen: soft, nontender, nondistended. No hepatosplenomegaly. No bruits or masses. Good bowel sounds. Extremities: no cyanosis, clubbing, rash, edema Neuro: will react to pain. Will not follow commands. Appears very weak  Telemetry: SR 90-100 Personally reviewed  Labs: Basic Metabolic Panel: Recent Labs  Lab 10/21/20 0500 10/21/20 0612 10/22/20 0641 10/23/20 0324 10/23/20 1415 10/24/20 0440 10/25/20 0422  NA 142   < > 148* 149* 151* 150* 146*  K 4.1    < > 4.4 5.2* 4.1 3.9 4.2  CL 112*  --  113* 114* 115* 110 107  CO2 18*  --  20* 22 20* 22 24  GLUCOSE 138*  --  212* 236* 199* 288* 231*  BUN 71*  --  84* 103* 108* 124* 97*  CREATININE 2.37*  --  2.75* 3.31* 3.49* 3.88* 3.23*  CALCIUM 7.8*  --  7.5* 7.9* 8.0* 7.8* 7.7*  MG 2.3  --  2.3 2.8*  --  2.7* 2.5*  PHOS 5.5*  --  6.4* 8.4*  --  8.2* 6.5*   < > = values in this interval not displayed.    Liver Function Tests: No results for input(s): AST, ALT, ALKPHOS, BILITOT, PROT, ALBUMIN in the last 168 hours. No results for input(s): LIPASE, AMYLASE in the last 168 hours. No results for input(s): AMMONIA in the last 168 hours.  CBC: Recent Labs  Lab 10/21/20 0500 10/21/20 0612 10/22/20 0641 10/23/20 0324 10/24/20 0440 10/25/20 0422  WBC 20.8*  --  17.4* 14.0* 12.1* 12.3*  HGB 8.0* 7.1* 7.6* 7.0* 9.5* 9.3*  HCT 26.3* 21.0* 24.5* 23.8* 30.5* 28.5*  MCV 95.6  --  94.2 97.5 93.6 90.8  PLT 243  --  254 228 268 251    Cardiac Enzymes: No results for input(s): CKTOTAL, CKMB, CKMBINDEX, TROPONINI in the last 168 hours.  BNP: BNP (last 3 results) Recent Labs    10/13/20 1513  BNP 589.0*    ProBNP (last 3 results) No  results for input(s): PROBNP in the last 8760 hours.    Other results:  Imaging: DG Chest Port 1 View  Result Date: 10/25/2020 CLINICAL DATA:  Intubation. EXAM: PORTABLE CHEST 1 VIEW COMPARISON:  10/24/2020. FINDINGS: Endotracheal tube, NG tube, left PICC line, right IJ line in stable position. Heart size normal. Bilateral interstitial infiltrates unchanged. Tiny right pleural effusion cannot be excluded. No pneumothorax. Left costophrenic angle incompletely imaged. IMPRESSION: 1. Lines and tubes in stable position. 2. Bilateral interstitial infiltrates unchanged. Tiny right pleural effusion cannot be excluded. Electronically Signed   By: Marcello Moores  Register   On: 10/25/2020 07:55   DG Chest Port 1 View  Result Date: 10/24/2020 CLINICAL DATA:  85 year old male  central line placement. Respiratory failure. Sepsis. EXAM: PORTABLE CHEST 1 VIEW COMPARISON:  Portable chest 0518 hours today. FINDINGS: Portable AP semi upright view at 0856 hours. Stable endotracheal and enteric tube. Stable left upper extremity approach PICC line. Right IJ central line placed. Tip at the SVC level just above the carina. No pneumothorax. Mediastinal contours remain normal. Stable bilateral ventilation. IMPRESSION: 1. Right IJ central line placed, tip at the SVC level with no adverse features. 2. Otherwise stable lines and tubes, stable ventilation. Electronically Signed   By: Genevie Ann M.D.   On: 10/24/2020 09:10   DG Chest Port 1 View  Result Date: 10/24/2020 CLINICAL DATA:  Hypoxia EXAM: PORTABLE CHEST 1 VIEW COMPARISON:  October 21, 2020 FINDINGS: Endotracheal tube tip is 4.2 cm above the carina. Central catheter tip is in the superior vena cava. Nasogastric tube tip and side port are below the diaphragm. No pneumothorax. There is persistent ill-defined opacity in the lung bases, more on the right than the left. The lungs elsewhere are clear. Heart size and pulmonary vascularity normal. No adenopathy. No bone lesions. IMPRESSION: Tube and catheter positions as described without pneumothorax. Ill-defined opacity persists in the lung bases, more on the right than on the left. Suspect bibasilar pneumonia, although a degree of pulmonary edema could present in this manner. No new opacity evident. Stable cardiac silhouette. Electronically Signed   By: Lowella Grip III M.D.   On: 10/24/2020 08:02     Medications:     Scheduled Medications: . amiodarone  200 mg Oral BID  . aspirin  81 mg Per Tube Daily  . bethanechol  10 mg Per Tube TID  . chlorhexidine gluconate (MEDLINE KIT)  15 mL Mouth Rinse BID  . Chlorhexidine Gluconate Cloth  6 each Topical Daily  . ezetimibe  10 mg Per Tube Daily  . feeding supplement (PROSource TF)  45 mL Per Tube BID  . free water  200 mL Per Tube Q4H   . heparin injection (subcutaneous)  5,000 Units Subcutaneous Q8H  . insulin aspart  0-15 Units Subcutaneous Q4H  . insulin aspart  5 Units Subcutaneous Q4H  . insulin detemir  20 Units Subcutaneous BID  . ipratropium-albuterol  3 mL Nebulization Q6H  . mouth rinse  15 mL Mouth Rinse 10 times per day  . metoCLOPramide (REGLAN) injection  5 mg Intravenous Q8H  . pantoprazole sodium  40 mg Per Tube Q1200  . rosuvastatin  20 mg Per Tube Daily  . sodium chloride flush  10-40 mL Intracatheter Q12H  . sodium chloride flush  10-40 mL Intracatheter Q12H  . sodium chloride flush  3 mL Intravenous Q12H    Infusions: . sodium chloride 10 mL/hr at 10/25/20 0800  . sodium chloride Stopped (10/18/20 1958)  . sodium chloride    .  sodium chloride    . feeding supplement (VITAL 1.5 CAL) 1,000 mL (10/24/20 1804)  . meropenem (MERREM) IV 1 g (10/25/20 0947)    PRN Medications: Place/Maintain arterial line **AND** sodium chloride, sodium chloride, sodium chloride, sodium chloride, acetaminophen (TYLENOL) oral liquid 160 mg/5 mL, alteplase, heparin, ondansetron **OR** ondansetron (ZOFRAN) IV, sodium chloride flush, sodium chloride flush   Assessment/Plan:   1. Acute Hypoxemic Respiratory Failure - due to PNA and HF - Developed acute respiratory failure on 12/4 and required intubation.  - CXR with pulmonary edema + infiltrates on 12/4.  - PCT 109 -> 84->43 -> 18 - Extubated 12/8 but required re intubation.  - Sputum cultures w/ normal respiratory flora.  - Completed vanc/meropenem - PNA much improved. Main barrier to extubation is his mental status but this is complicated by uremia. - Off sedation ~ 3 days so far - Continue HD and see if mental status improves - If mental status not improving will have to decide on trach early next week  2. Shock - likely sepsis/cardiogenic combined. Good co-ox yesterday, pending today.  - Echo- reduced EF with EF 25-30% and WMA. RV normal.   - Now off NE.  Co-ox 71% - Completed vanc/mero on 12/15 - Cx all negative - now stabilized   3. Acute Systolic Heart Failure  - As above Echo with EF 25-30% . No previous ECHO. No previous cath.  - Hs Trop [phone]. - RHC 12/6 with low filling pressures after diuresis and low index 1.9. SVR 1467 - Unable to add GDMT due to shock.  - Repeat CO-OX 71% - Management as above - Suspect combination of septic and cardiogenic shock. Troponin moderately elevated but flat. Possibility of Tako-tsubo/septic CM has been raised and seems to fit clinically but cannot rule out CAD.  If he recovers and creatinine comes down, needs coronary angiography.  - Can repeat echo soon  4. Back Pain--> S/P L5-S1 laminectomy with fusion 09/28/2020  Per Neurosugery  5. AKI - Started HD on 12/15. Continue  6. Ileus - Improved, has had BM though still distended.    7. Hypernatremia - Na 146 - free water boluses. Adjust with HD  8. Anemia - Hgb 9.3 today.  - monitor, transfuse for hgb <7.0   9. Encephalopathy, metabolic - ? Uremia. - CT ok - concern for ICU myopathy as well - if not waking up with HD. Consider MRI +/- repeat ECG  Discussed with his daughter at the bedside.   CRITICAL CARE Performed by: Glori Bickers  Total critical care time: 40 minutes  Critical care time was exclusive of separately billable procedures and treating other patients.  Critical care was necessary to treat or prevent imminent or life-threatening deterioration.  Critical care was time spent personally by me (independent of midlevel providers or residents) on the following activities: development of treatment plan with patient and/or surrogate as well as nursing, discussions with consultants, evaluation of patient's response to treatment, examination of patient, obtaining history from patient or surrogate, ordering and performing treatments and interventions, ordering and review of laboratory studies, ordering and review of  radiographic studies, pulse oximetry and re-evaluation of patient's condition.    Length of Stay: 15  Glori Bickers, MD  11:35 AM

## 2020-10-26 LAB — CBC
HCT: 28 % — ABNORMAL LOW (ref 39.0–52.0)
Hemoglobin: 8.7 g/dL — ABNORMAL LOW (ref 13.0–17.0)
MCH: 28.8 pg (ref 26.0–34.0)
MCHC: 31.1 g/dL (ref 30.0–36.0)
MCV: 92.7 fL (ref 80.0–100.0)
Platelets: 241 10*3/uL (ref 150–400)
RBC: 3.02 MIL/uL — ABNORMAL LOW (ref 4.22–5.81)
RDW: 14.1 % (ref 11.5–15.5)
WBC: 10.1 10*3/uL (ref 4.0–10.5)
nRBC: 0 % (ref 0.0–0.2)

## 2020-10-26 LAB — BASIC METABOLIC PANEL
Anion gap: 18 — ABNORMAL HIGH (ref 5–15)
Anion gap: 14 (ref 5–15)
BUN: 139 mg/dL — ABNORMAL HIGH (ref 8–23)
BUN: 83 mg/dL — ABNORMAL HIGH (ref 8–23)
CO2: 20 mmol/L — ABNORMAL LOW (ref 22–32)
CO2: 24 mmol/L (ref 22–32)
Calcium: 7.7 mg/dL — ABNORMAL LOW (ref 8.9–10.3)
Calcium: 7.5 mg/dL — ABNORMAL LOW (ref 8.9–10.3)
Chloride: 109 mmol/L (ref 98–111)
Chloride: 103 mmol/L (ref 98–111)
Creatinine, Ser: 4.71 mg/dL — ABNORMAL HIGH (ref 0.61–1.24)
Creatinine, Ser: 3.24 mg/dL — ABNORMAL HIGH (ref 0.61–1.24)
GFR, Estimated: 12 mL/min — ABNORMAL LOW (ref >60)
GFR, Estimated: 18 mL/min — ABNORMAL LOW (ref >60)
Glucose, Bld: 232 mg/dL — ABNORMAL HIGH (ref 70–99)
Glucose, Bld: 223 mg/dL — ABNORMAL HIGH (ref 70–99)
Potassium: 4.9 mmol/L (ref 3.5–5.1)
Potassium: 4.2 mmol/L (ref 3.5–5.1)
Sodium: 147 mmol/L — ABNORMAL HIGH (ref 135–145)
Sodium: 141 mmol/L (ref 135–145)

## 2020-10-26 LAB — COOXEMETRY PANEL
Carboxyhemoglobin: 1.2 % (ref 0.5–1.5)
Methemoglobin: 0.9 % (ref 0.0–1.5)
O2 Saturation: 81.3 %
Total hemoglobin: 8.7 g/dL — ABNORMAL LOW (ref 12.0–16.0)

## 2020-10-26 LAB — GLUCOSE, CAPILLARY
Glucose-Capillary: 211 mg/dL — ABNORMAL HIGH (ref 70–99)
Glucose-Capillary: 191 mg/dL — ABNORMAL HIGH (ref 70–99)
Glucose-Capillary: 171 mg/dL — ABNORMAL HIGH (ref 70–99)
Glucose-Capillary: 196 mg/dL — ABNORMAL HIGH (ref 70–99)
Glucose-Capillary: 148 mg/dL — ABNORMAL HIGH (ref 70–99)
Glucose-Capillary: 177 mg/dL — ABNORMAL HIGH (ref 70–99)

## 2020-10-26 LAB — TRIGLYCERIDES: Triglycerides: 54 mg/dL (ref <150)

## 2020-10-26 MED ORDER — HEPARIN (PORCINE) 2000 UNITS/L FOR CRRT
INTRAVENOUS_CENTRAL | Status: DC | PRN
  Filled 2020-10-26 (×5): qty 1000

## 2020-10-26 MED ORDER — HEPARIN SODIUM (PORCINE) 1000 UNIT/ML IJ SOLN
INTRAMUSCULAR | Status: AC
  Administered 2020-10-26: 10:00:00 2600 [IU] via INTRAVENOUS_CENTRAL
  Filled 2020-10-26: qty 2

## 2020-10-26 MED ORDER — PRISMASOL BGK 4/2.5 32-4-2.5 MEQ/L EC SOLN: Status: DC

## 2020-10-26 MED ORDER — AMIODARONE HCL 200 MG PO TABS
200.0000 mg | ORAL_TABLET | Freq: Two times a day (BID) | ORAL | Status: DC
  Administered 2020-10-26 – 2020-10-30 (×9): 200 mg
  Filled 2020-10-26 (×9): qty 1

## 2020-10-26 MED ORDER — HEPARIN SODIUM (PORCINE) 1000 UNIT/ML DIALYSIS: 40.0000 [IU]/kg | INTRAMUSCULAR | Status: DC | PRN

## 2020-10-26 MED ORDER — HEPARIN SODIUM (PORCINE) 1000 UNIT/ML IJ SOLN
INTRAMUSCULAR | Status: AC
  Administered 2020-10-26: 08:00:00 2600 [IU] via INTRAVENOUS_CENTRAL
  Filled 2020-10-26: qty 4

## 2020-10-26 MED ORDER — PRISMASOL BGK 4/2.5 32-4-2.5 MEQ/L REPLACEMENT SOLN: Status: DC

## 2020-10-26 MED ORDER — HEPARIN BOLUS VIA INFUSION (CRRT)
1000.0000 [IU] | INTRAVENOUS | Status: DC | PRN
  Filled 2020-10-26: qty 1000

## 2020-10-26 MED ORDER — HEPARIN SODIUM (PORCINE) 1000 UNIT/ML DIALYSIS
40.0000 [IU]/kg | INTRAMUSCULAR | Status: DC | PRN
Start: 2020-10-26 — End: 2020-10-26

## 2020-10-26 MED ORDER — HEPARIN SODIUM (PORCINE) 1000 UNIT/ML DIALYSIS
1000.0000 [IU] | INTRAMUSCULAR | Status: DC | PRN
  Administered 2020-10-29: 15:00:00 2600 [IU] via INTRAVENOUS_CENTRAL
  Filled 2020-10-26 (×2): qty 6
  Filled 2020-10-26: qty 2
  Filled 2020-10-26: qty 6

## 2020-10-26 MED ORDER — SODIUM CHLORIDE 0.9 % IV SOLN
250.0000 [IU]/h | INTRAVENOUS | Status: DC
  Administered 2020-10-26 – 2020-10-27 (×2): 400 [IU]/h via INTRAVENOUS_CENTRAL
  Filled 2020-10-26 (×2): qty 2

## 2020-10-26 NOTE — Progress Notes (Signed)
Patient ID: Dennis Carey, male   DOB: [birth year], 85 y.o.   MRN: [id number] [facility] Progress Note   Assessment/ Plan:   1.  Acute kidney injury on chronic kidney disease stage III: Likely hemodynamically mediated with CHF exacerbation/ongoing diuresis with evolution to mild ATN.  He underwent his initial hemodialysis treatment on early 12/16 and will have hemodialysis done again today (possibly again tomorrow) for management of uremia that is thought to be affecting his mental status and ability to wean from the ventilator.  He is nonoliguric and based on the interval rise of his BUN/creatinine-possibly will have delayed renal recovery. 2.  Hypernatremia: Secondary to free water deficit in this intubated patient with increased insensible losses and ongoing diuresis.  Continue free water flushes monitor with hemodialysis.   3.  Anemia: Likely secondary to acute illness and possibly hemoconcentrated with intravascular volume contraction/ongoing diuresis-no overt loss and transfused 1 unit on 12/13. 4.  Encephalopathy: Suspected to be multifactorial but primarily from severe azotemia-continue hemodialysis. 5.  Acute hypoxic respiratory failure: Suspected multifactorial from HCAP and acute pulmonary edema from CHF exacerbation.  Appears to have been diuresed satisfactorily, ventilator settings per critical care.  Subjective:   Unable to get hemodialysis yesterday due to high patient volume/staff ratio.  Without acute events overnight.   Objective:   BP (!) 109/48   Pulse 95   Temp 97.8 F (36.6 C) (Oral)   Resp (!) 30   Ht '5\' 7"'  (1.702 m)   Wt 66 kg   SpO2 98%   BMI 22.79 kg/m   Intake/Output Summary (Last 24 hours) at 10/26/2020 0726 Last data filed at 10/26/2020 0600 Gross per 24 hour  Intake 2962.01 ml  Output 1735 ml  Net 1227.01 ml   Weight change: -3.5 kg  Physical Exam: Gen: Intubated, intermittently responsive to his daughter at bedside.  Being prepared for  dialysis. CVS: Pulse regular rhythm, normal rate, S1 and S2 normal without any murmurs, rub or gallop Resp: Anteriorly clear to auscultation, no distinct rales or rhonchi Abd: Soft, flat, nontender Ext: 1+ upper extremity edema, trace dependent edema.  No lower extremity edema.  Imaging: DG Chest Port 1 View  Result Date: 10/25/2020 CLINICAL DATA:  Intubation. EXAM: PORTABLE CHEST 1 VIEW COMPARISON:  10/24/2020. FINDINGS: Endotracheal tube, NG tube, left PICC line, right IJ line in stable position. Heart size normal. Bilateral interstitial infiltrates unchanged. Tiny right pleural effusion cannot be excluded. No pneumothorax. Left costophrenic angle incompletely imaged. IMPRESSION: 1. Lines and tubes in stable position. 2. Bilateral interstitial infiltrates unchanged. Tiny right pleural effusion cannot be excluded. Electronically Signed   By: Marcello Moores  Register   On: 10/25/2020 07:55   DG Chest Port 1 View  Result Date: 10/24/2020 CLINICAL DATA:  85 year old male central line placement. Respiratory failure. Sepsis. EXAM: PORTABLE CHEST 1 VIEW COMPARISON:  Portable chest 0518 hours today. FINDINGS: Portable AP semi upright view at 0856 hours. Stable endotracheal and enteric tube. Stable left upper extremity approach PICC line. Right IJ central line placed. Tip at the SVC level just above the carina. No pneumothorax. Mediastinal contours remain normal. Stable bilateral ventilation. IMPRESSION: 1. Right IJ central line placed, tip at the SVC level with no adverse features. 2. Otherwise stable lines and tubes, stable ventilation. Electronically Signed   By: Genevie Ann M.D.   On: 10/24/2020 09:10    Labs: BMET Recent Labs  Lab 10/20/20 0233 10/20/20 1433 10/21/20 0500 10/21/20 0612 10/22/20 6553 10/23/20 0324 10/23/20 1415 10/24/20 0440  10/25/20 0422 10/26/20 0456  NA 139   < > 142 144 148* 149* 151* 150* 146* 147*  K 4.7   < > 4.1 3.9 4.4 5.2* 4.1 3.9 4.2 4.9  CL 109   < > 112*  --  113*  114* 115* 110 107 109  CO2 19*   < > 18*  --  20* 22 20* 22 24 20*  GLUCOSE 235*   < > 138*  --  212* 236* 199* 288* 231* 232*  BUN 52*   < > 71*  --  84* 103* 108* 124* 97* 139*  CREATININE 2.18*   < > 2.37*  --  2.75* 3.31* 3.49* 3.88* 3.23* 4.71*  CALCIUM 7.7*   < > 7.8*  --  7.5* 7.9* 8.0* 7.8* 7.7* 7.7*  PHOS 5.9*  --  5.5*  --  6.4* 8.4*  --  8.2* 6.5*  --    < > = values in this interval not displayed.   CBC Recent Labs  Lab 10/23/20 0324 10/24/20 0440 10/25/20 0422 10/26/20 0456  WBC 14.0* 12.1* 12.3* 10.1  HGB 7.0* 9.5* 9.3* 8.7*  HCT 23.8* 30.5* 28.5* 28.0*  MCV 97.5 93.6 90.8 92.7  PLT 228 268 251 241    Medications:    . amiodarone  200 mg Oral BID  . aspirin  81 mg Per Tube Daily  . bethanechol  10 mg Per Tube TID  . chlorhexidine gluconate (MEDLINE KIT)  15 mL Mouth Rinse BID  . Chlorhexidine Gluconate Cloth  6 each Topical Daily  . ezetimibe  10 mg Per Tube Daily  . feeding supplement (PROSource TF)  45 mL Per Tube BID  . free water  200 mL Per Tube Q4H  . heparin injection (subcutaneous)  5,000 Units Subcutaneous Q8H  . heparin sodium (porcine)      . insulin aspart  0-15 Units Subcutaneous Q4H  . insulin aspart  5 Units Subcutaneous Q4H  . insulin detemir  20 Units Subcutaneous BID  . ipratropium-albuterol  3 mL Nebulization Q6H  . mouth rinse  15 mL Mouth Rinse 10 times per day  . metoCLOPramide (REGLAN) injection  5 mg Intravenous Q8H  . pantoprazole sodium  40 mg Per Tube Q1200  . rosuvastatin  20 mg Per Tube Daily  . sodium chloride flush  10-40 mL Intracatheter Q12H  . sodium chloride flush  10-40 mL Intracatheter Q12H  . sodium chloride flush  3 mL Intravenous Q12H   Elmarie Shiley, MD 10/26/2020, 7:26 AM

## 2020-10-26 NOTE — Progress Notes (Signed)
Dr. Posey Pronto paged to clarify CRRT anticoagulation orders. Verbal order to infuse systemic anticoagulation (Heparin)  in CRRT at 400 units/hr (0.60ml/hr) continuously with no ACT checks at this time. Will continue to monitor patient.

## 2020-10-26 NOTE — Procedures (Signed)
Patient seen on Hemodialysis. BP (!) 109/48   Pulse 95   Temp 99.6 F (37.6 C) (Axillary)   Resp (!) 30   Ht 5\' 7"  (1.702 m)   Wt 66 kg   SpO2 98%   BMI 22.79 kg/m   QB 300, UF goal keeping even (net 0) Tolerating treatment without problems at this time.   Elmarie Shiley MD [facility]. Office # [phone] Pager # [phone] 7:48 AM

## 2020-10-26 NOTE — Progress Notes (Signed)
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  Seem to respond nicely to titration of PEEP.  Changed sedation to propofol and fentanyl.  Broad-spectrum antibiotics continued.  Primary barriers to progress seem to be atelectasis, adynamic ileus, resolving pneumonia, and severe deconditioning all superimposed on his underlying stress cardiomyopathy   Consults:  Neurosurgery Cardiology  Procedures:  10/13/20 Intubation 12/5 bronch 12/15 > R IJ HD cath  Significant Diagnostic Tests:  10/11/20 Chest X-ray, minimal left basilar atelectasis 12/3 LE venous doppler: neg 12/4 echo: LVEF 25-30% 12/10 KUB: Consistent with colonic ileus-gas present down to rectum 12/13 Renal US > mild perinephric fluid, no obstruction.  Micro Data:  11/06/2020 COVID, Flu neg 12/4:  MRSA PCR neg 12/4 Bronch tracheal aspirate - GNR, GPC growing 12/4 blood: ngtd  Antimicrobials:  Vanc 12/4 > 12/13 Cefepime 12/4 > 12/10 Doxycycline 12/6 > 2/9 Merrem 12/10 > 12/16  Interim history/subjective:   Started HD early 2/16, session starting this morning Has completed antibiotics for H CAP Tolerating PSV 12, PEEP 8 this morning, Coating on tongue reported SCR 4.71, anion gap 18, BUN 139 Total I/O- 4.5 L  Objective   Blood pressure (!) 113/55, pulse (!) 107, temperature 99.6 F (37.6 C), temperature source Axillary, resp. rate (!) 30, height 5\' 7"  (1.702 m), weight 66 kg, SpO2 97 %. CVP:  [0 mmHg-4 mmHg] 4 mmHg  Vent Mode: PSV;CPAP FiO2 (%):  [40 %] 40 % Set  Rate:  [20 bmp] 20 bmp Vt Set:  [520 mL] 520 mL PEEP:  [5 cmH20-8 cmH20] 8 cmH20 Pressure Support:  [5 LEX51-70 cmH20] 5 cmH20 Plateau Pressure:  [18 cmH20] 18 cmH20   Intake/Output Summary (Last 24 hours) at 10/26/2020 0926 Last data filed at 10/26/2020 0600 Gross per 24 hour  Intake 2799.02 ml  Output 1585 ml  Net 1214.02 ml   Filed Weights   10/25/20 0455 10/26/20 0402 10/26/20 0730  Weight: 66 kg 66 kg 66 kg   Physical Exam: General: Ill-appearing elderly man, intubated Neuro: Eyes partially open, does follow commands briskly, breathes over the set rate.  Able to move upper extremities but globally weak HEENT: ET tube in place, oropharynx otherwise clear, pupils equal Cardiovascular: Regular, distant, no murmur Lungs: Scattered rhonchi, decreased at both bases, no wheezing Abdomen: Nondistended, positive bowel sounds Musculoskeletal: No significant edema Skin: Warm, no rash noted  Assessment & Plan:   L5-S1 DJD s/p laminectomy/decompression -Postop care as per neurosurgery recommendations, plans  Acute hypoxic respiratory failure due to acute pulm edema and likely aspiration pneumonitis vs HCAP (s/p 10 days vanc and 7 days merrem). -PRVC 8 cc/kg.  Wean PEEP to 5 on 12/17 -PSV as he can tolerate, but mental status and renal status are limiting efforts at extubation currently. -Completed vancomycin, meropenem, follow for any evidence of infection, cultures negative to date -Minimize sedation, RASS goal 0 to -1  Acute metabolic encephalopathy, appears to be slowly improving -Minimize sedation -Continue to correct underlying metabolic issues particularly uremia  AKI due to ATN, Obstruction was ruled out by negative  venous ultrasound -Continue hemodialysis, goal correction uremia will allow improved mental status and overall progress particularly with ventilator weaning.  Appreciate nephrology management -Follow BMP, urine output (still making urine) -Renal dose  medications  Hypernatremia - 2/2 albumin + lasix. -Free water as ordered -Keeping patient even with HD -Follow BMP  Type 2 diabetes, well controlled -Sliding-scale insulin and Levemir as ordered  Anemia of acute illness, Has received blood transfusion couple of times during this admission -Following CBC -Transfusion goal hemoglobin 7  Generalized deconditioning. -PT/OT when able.  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 -Amiodarone as ordered, aspirin as ordered  Best practice (evaluated daily)  Diet: TF Pain/Anxiety/Delirium protocol (if indicated): Hold all sedation. VAP protocol (if indicated): ordered DVT prophylaxis: Subcu heparin GI prophylaxis: PPI Glucose control: SSI, levemir Mobility: BR Code Status: Full Communication: daughter updated at bedside 12/17. Disposition: ICU   Total critical care time: 33 minutes   Critical care time was exclusive of separately billable procedures and treating other patients.   Critical care was necessary to treat or prevent imminent or life-threatening deterioration.   Critical care was time spent personally by me on the following activities: development of treatment plan with patient and/or surrogate as well as nursing, discussions with consultants, evaluation of patient's response to treatment, examination of patient, obtaining history from patient or surrogate, ordering and performing treatments and interventions, ordering and review of laboratory studies, ordering and review of radiographic studies, pulse oximetry and re-evaluation of patient's condition.    Baltazar Apo, MD, PhD 10/26/2020, 9:43 AM [facility] [phone] or if no answer [phone]

## 2020-10-26 NOTE — Progress Notes (Signed)
   10/26/20 1045  Hand-Off documentation  Handoff Given Given to shift RN/LPN  Report given to (Full Name) [facility] Received  (HD)  Report received from (Full Name) Jerrye Bushy  Vitals  Temp 98.8 F (37.1 C)  Temp Source Axillary  BP (!) 129/59  MAP (mmHg) 79  Pulse Rate 100  ECG Heart Rate 100  Resp (!) 27  Oxygen Therapy  SpO2 99 %  O2 Device Ventilator  Post-Hemodialysis Assessment  Rinseback Volume (mL) 250 mL  KECN 198 V  Dialyzer Clearance Lightly streaked  Duration of HD Treatment -hour(s) 3 hour(s)  Hemodialysis Intake (mL) 500 mL  UF Total -Machine (mL) 489 mL  Net UF (mL) -11 mL  Tolerated HD Treatment Yes  Post-Hemodialysis Comments tx completed-pt stable  Hemodialysis Catheter Right Internal jugular Triple lumen Temporary (Non-Tunneled)  Placement Date/Time: 10/24/20 0845   Placed prior to admission: No  Time Out: Correct patient;Correct site;Correct procedure  Maximum sterile barrier precautions: Hand hygiene;Sterile gloves;Large sterile sheet;Cap;Mask;Sterile gown  Site Prep: Chlorh...  Site Condition No complications  Blue Lumen Status Capped (Central line);Heparin locked;Flushed  Red Lumen Status Flushed;Heparin locked;Capped (Central line)  Purple Lumen Status Capped (Central line)  Catheter fill solution Heparin 1000 units/ml  Catheter fill volume (Arterial) 1.3 cc  Catheter fill volume (Venous) 1.3  Dressing Type Occlusive;Gauze/Drain sponge  Dressing Status Clean;Intact;Dry  Antimicrobial disc in place? Yes  Interventions Antimicrobial disc changed  Drainage Description None  Dressing Change Due November 18, 2020  Post treatment catheter status Capped and Clamped  HD tx complete, positional HD cath, frequent high arterial pressures, lines reversed and BFR lowered, pt ran at 259ml/min. BP stable, dropped in 80s x1. No other issues noted.

## 2020-10-26 NOTE — Plan of Care (Signed)
  Problem: Education: Goal: Knowledge of General Education information will improve Description: Including pain rating scale, medication(s)/side effects and non-pharmacologic comfort measures Outcome: Progressing   Problem: Health Behavior/Discharge Planning: Goal: Ability to manage health-related needs will improve Outcome: Progressing   Problem: Clinical Measurements: Goal: Ability to maintain clinical measurements within normal limits will improve Outcome: Progressing Goal: Will remain free from infection Outcome: Progressing Goal: Diagnostic test results will improve Outcome: Progressing Goal: Respiratory complications will improve Outcome: Progressing Goal: Cardiovascular complication will be avoided Outcome: Progressing   Problem: Activity: Goal: Risk for activity intolerance will decrease Outcome: Progressing   Problem: Nutrition: Goal: Adequate nutrition will be maintained Outcome: Progressing   Problem: Coping: Goal: Level of anxiety will decrease Outcome: Progressing   Problem: Elimination: Goal: Will not experience complications related to bowel motility Outcome: Progressing Goal: Will not experience complications related to urinary retention Outcome: Progressing   Problem: Pain Managment: Goal: General experience of comfort will improve Outcome: Progressing   Problem: Safety: Goal: Ability to remain free from injury will improve Outcome: Progressing   Problem: Skin Integrity: Goal: Risk for impaired skin integrity will decrease Outcome: Progressing   Problem: Activity: Goal: Ability to tolerate increased activity will improve Outcome: Progressing   Problem: Respiratory: Goal: Ability to maintain a clear airway and adequate ventilation will improve Outcome: Progressing   Problem: Role Relationship: Goal: Method of communication will improve Outcome: Progressing   Problem: Education: Goal: Understanding of CV disease, CV risk reduction, and  recovery process will improve Outcome: Progressing Goal: Individualized Educational Video(s) Outcome: Progressing   Problem: Activity: Goal: Ability to return to baseline activity level will improve Outcome: Progressing   Problem: Cardiovascular: Goal: Ability to achieve and maintain adequate cardiovascular perfusion will improve Outcome: Progressing Goal: Vascular access site(s) Level 0-1 will be maintained Outcome: Progressing   Problem: Health Behavior/Discharge Planning: Goal: Ability to safely manage health-related needs after discharge will improve Outcome: Progressing   

## 2020-10-26 NOTE — Progress Notes (Signed)
Patient ID: Dennis Carey, male   DOB: [birth year], 85 y.o.   MRN: [id number]     Advanced Heart Failure Rounding Note   Subjective:    12/7 Bronch with copious secretions that were removed.  12/8 Extubated but required re-intubation.  12/15 started HD  Remains intubated.  Sedation off x 4 days. Unresponsive for me. Apparently was sticking his tongue out on command earlier.   Had HD (2nd treatment earlier today)   Has completed abx.   Objective:   Weight Range:  Vital Signs:   Temp:  [97.8 F (36.6 C)-100.7 F (38.2 C)] 99.2 F (37.3 C) (12/17 1125) Pulse Rate:  [93-108] 95 (12/17 1107) Resp:  [27-35] 27 (12/17 1107) BP: (88-176)/(42-66) 176/66 (12/17 1107) SpO2:  [96 %-100 %] 100 % (12/17 1107) Arterial Line BP: (132-167)/(49-60) 144/51 (12/17 0600) FiO2 (%):  [40 %] 40 % (12/17 1107) Weight:  [66 kg] 66 kg (12/17 0730) Last BM Date: 10/26/20  Weight change: Filed Weights   10/25/20 0455 10/26/20 0402 10/26/20 0730  Weight: 66 kg 66 kg 66 kg    Intake/Output:   Intake/Output Summary (Last 24 hours) at 10/26/2020 1227 Last data filed at 10/26/2020 1045 Gross per 24 hour  Intake 2432.46 ml  Output 1499 ml  Net 933.46 ml     PHYSICAL EXAM: General:  Intubated. NAD HEENT: normal +ETT/NG Neck: supple.RIJ trialysisCarotids 2+ bilat; no bruits. No lymphadenopathy or thryomegaly appreciated. Cor: PMI nondisplaced. Regular rate & rhythm. No rubs, gallops or murmurs. Lungs: clear Abdomen: soft, nontender, nondistended. No hepatosplenomegaly. No bruits or masses. Good bowel sounds. Extremities: no cyanosis, clubbing, rash, edema Neuro: will react to pain. Will not follow commands. Appears very weak  Telemetry: SR 90-100 Personally reviewed  Labs: Basic Metabolic Panel: Recent Labs  Lab 10/21/20 0500 10/21/20 0612 10/22/20 0641 10/23/20 0324 10/23/20 1415 10/24/20 0440 10/25/20 0422 10/26/20 0456  NA 142   < > 148* 149* 151* 150* 146* 147*  K 4.1   <  > 4.4 5.2* 4.1 3.9 4.2 4.9  CL 112*  --  113* 114* 115* 110 107 109  CO2 18*  --  20* 22 20* 22 24 20*  GLUCOSE 138*  --  212* 236* 199* 288* 231* 232*  BUN 71*  --  84* 103* 108* 124* 97* 139*  CREATININE 2.37*  --  2.75* 3.31* 3.49* 3.88* 3.23* 4.71*  CALCIUM 7.8*  --  7.5* 7.9* 8.0* 7.8* 7.7* 7.7*  MG 2.3  --  2.3 2.8*  --  2.7* 2.5*  --   PHOS 5.5*  --  6.4* 8.4*  --  8.2* 6.5*  --    < > = values in this interval not displayed.    Liver Function Tests: No results for input(s): AST, ALT, ALKPHOS, BILITOT, PROT, ALBUMIN in the last 168 hours. No results for input(s): LIPASE, AMYLASE in the last 168 hours. No results for input(s): AMMONIA in the last 168 hours.  CBC: Recent Labs  Lab 10/22/20 0641 10/23/20 0324 10/24/20 0440 10/25/20 0422 10/26/20 0456  WBC 17.4* 14.0* 12.1* 12.3* 10.1  HGB 7.6* 7.0* 9.5* 9.3* 8.7*  HCT 24.5* 23.8* 30.5* 28.5* 28.0*  MCV 94.2 97.5 93.6 90.8 92.7  PLT 254 228 268 251 241    Cardiac Enzymes: No results for input(s): CKTOTAL, CKMB, CKMBINDEX, TROPONINI in the last 168 hours.  BNP: BNP (last 3 results) Recent Labs    10/13/20 1513  BNP 589.0*    ProBNP (last 3 results) No  results for input(s): PROBNP in the last 8760 hours.    Other results:  Imaging: DG Chest Port 1 View  Result Date: 10/25/2020 CLINICAL DATA:  Intubation. EXAM: PORTABLE CHEST 1 VIEW COMPARISON:  10/24/2020. FINDINGS: Endotracheal tube, NG tube, left PICC line, right IJ line in stable position. Heart size normal. Bilateral interstitial infiltrates unchanged. Tiny right pleural effusion cannot be excluded. No pneumothorax. Left costophrenic angle incompletely imaged. IMPRESSION: 1. Lines and tubes in stable position. 2. Bilateral interstitial infiltrates unchanged. Tiny right pleural effusion cannot be excluded. Electronically Signed   By: [place]   On: 10/25/2020 07:55     Medications:     Scheduled Medications: . amiodarone  200 mg Oral BID  .  aspirin  81 mg Per Tube Daily  . bethanechol  10 mg Per Tube TID  . chlorhexidine gluconate (MEDLINE KIT)  15 mL Mouth Rinse BID  . Chlorhexidine Gluconate Cloth  6 each Topical Daily  . ezetimibe  10 mg Per Tube Daily  . feeding supplement (PROSource TF)  45 mL Per Tube BID  . free water  200 mL Per Tube Q4H  . heparin injection (subcutaneous)  5,000 Units Subcutaneous Q8H  . insulin aspart  0-15 Units Subcutaneous Q4H  . insulin aspart  5 Units Subcutaneous Q4H  . insulin detemir  20 Units Subcutaneous BID  . ipratropium-albuterol  3 mL Nebulization Q6H  . mouth rinse  15 mL Mouth Rinse 10 times per day  . metoCLOPramide (REGLAN) injection  5 mg Intravenous Q8H  . pantoprazole sodium  40 mg Per Tube Q1200  . rosuvastatin  20 mg Per Tube Daily  . sodium chloride flush  10-40 mL Intracatheter Q12H  . sodium chloride flush  10-40 mL Intracatheter Q12H  . sodium chloride flush  3 mL Intravenous Q12H    Infusions: . sodium chloride 10 mL/hr at 10/26/20 0600  . sodium chloride Stopped (10/18/20 1958)  . sodium chloride    . sodium chloride    . feeding supplement (VITAL 1.5 CAL) 55 mL/hr at 10/25/20 2300    PRN Medications: Place/Maintain arterial line **AND** sodium chloride, sodium chloride, sodium chloride, sodium chloride, acetaminophen (TYLENOL) oral liquid 160 mg/5 mL, alteplase, heparin, heparin, ondansetron **OR** ondansetron (ZOFRAN) IV, sodium chloride flush, sodium chloride flush   Assessment/Plan:   1. Acute Hypoxemic Respiratory Failure - due to PNA and HF - Developed acute respiratory failure on 12/4 and required intubation.  - CXR with pulmonary edema + infiltrates on 12/4.  - PCT 109 -> 84->43 -> 18 - Extubated 12/8 but required re intubation.  - Sputum cultures w/ normal respiratory flora.  - Completed vanc/meropenem - PNA much improved. Main barrier to extubation is his mental status but this is complicated by uremia. - Off sedation ~ 4 days so far - BUN  quite high this am and now s/p HD. I spoke with Renal - will recheck labs this afternoon if BUN high will switch to CVVHD to try and clear uremia faster. - If mental status not improving will have to decide on trach early next week  2. Shock - likely sepsis/cardiogenic combined. Good co-ox yesterday, pending today.  - Echo- reduced EF with EF 25-30% and WMA. RV normal.   - Now off NE. Co-ox 81% - Completed vanc/mero on 12/15 - Cx all negative - Having low-grade fevers again. Will need to follow closely. Low threshold to reculture.   3. Acute Systolic Heart Failure  - As above Echo with EF  25-30% . No previous ECHO. No previous cath.  - Hs Trop [phone]. - RHC 12/6 with low filling pressures after diuresis and low index 1.9. SVR 1467 - Unable to add GDMT due to shock.  - Repeat CO-OX 81% - Management as above - Suspect combination of septic and cardiogenic shock. Troponin moderately elevated but flat. Possibility of Tako-tsubo/septic CM has been raised and seems to fit clinically but cannot rule out CAD.  If he recovers and creatinine comes down, needs coronary angiography.  - Will repeat echo soon  4. Back Pain--> S/P L5-S1 laminectomy with fusion 09/28/2020  Per Neurosugery  5. AKI - Started HD on 12/15. Plan as above  6. Ileus - Improved, has had BM though still distended.    7. Hypernatremia - Na 147 - continue free water boluses. Adjust with HD  8. Anemia - Hgb 9.3 today.  - monitor, transfuse for hgb <7.0   9. Encephalopathy, metabolic - ? Uremia. Continue HD - CT ok - concern for ICU myopathy as well - if not waking up with HD. Consider MRI +/- repeat ECG   CRITICAL CARE Performed by: Glori Bickers  Total critical care time: 35 minutes  Critical care time was exclusive of separately billable procedures and treating other patients.  Critical care was necessary to treat or prevent imminent or life-threatening deterioration.  Critical care was time  spent personally by me (independent of midlevel providers or residents) on the following activities: development of treatment plan with patient and/or surrogate as well as nursing, discussions with consultants, evaluation of patient's response to treatment, examination of patient, obtaining history from patient or surrogate, ordering and performing treatments and interventions, ordering and review of laboratory studies, ordering and review of radiographic studies, pulse oximetry and re-evaluation of patient's condition.    Length of Stay: 16  Glori Bickers, MD  12:27 PM

## 2020-10-27 ENCOUNTER — Inpatient Hospital Stay (HOSPITAL_COMMUNITY): Payer: Medicare Other

## 2020-10-27 DIAGNOSIS — I5021 Acute systolic (congestive) heart failure: Secondary | ICD-10-CM

## 2020-10-27 LAB — POCT I-STAT 7, (LYTES, BLD GAS, ICA,H+H)
Acid-base deficit: 3 mmol/L — ABNORMAL HIGH (ref 0.0–2.0)
Acid-base deficit: 1 mmol/L (ref 0.0–2.0)
Bicarbonate: 24.4 mmol/L (ref 20.0–28.0)
Bicarbonate: 25.7 mmol/L (ref 20.0–28.0)
Calcium, Ion: 1.11 mmol/L — ABNORMAL LOW (ref 1.15–1.40)
Calcium, Ion: 1.17 mmol/L (ref 1.15–1.40)
HCT: 23 % — ABNORMAL LOW (ref 39.0–52.0)
HCT: 20 % — ABNORMAL LOW (ref 39.0–52.0)
Hemoglobin: 7.8 g/dL — ABNORMAL LOW (ref 13.0–17.0)
Hemoglobin: 6.8 g/dL — CL (ref 13.0–17.0)
O2 Saturation: 93 %
O2 Saturation: 100 %
Patient temperature: 97
Patient temperature: 97.6
Potassium: 5.7 mmol/L — ABNORMAL HIGH (ref 3.5–5.1)
Potassium: 5.9 mmol/L — ABNORMAL HIGH (ref 3.5–5.1)
Sodium: 136 mmol/L (ref 135–145)
Sodium: 133 mmol/L — ABNORMAL LOW (ref 135–145)
TCO2: 26 mmol/L (ref 22–32)
TCO2: 27 mmol/L (ref 22–32)
pCO2 arterial: 51 mmHg — ABNORMAL HIGH (ref 32.0–48.0)
pCO2 arterial: 51.6 mmHg — ABNORMAL HIGH (ref 32.0–48.0)
pH, Arterial: 7.283 — ABNORMAL LOW (ref 7.350–7.450)
pH, Arterial: 7.303 — ABNORMAL LOW (ref 7.350–7.450)
pO2, Arterial: 72 mmHg — ABNORMAL LOW (ref 83.0–108.0)
pO2, Arterial: 197 mmHg — ABNORMAL HIGH (ref 83.0–108.0)

## 2020-10-27 LAB — COMPREHENSIVE METABOLIC PANEL
ALT: 552 U/L — ABNORMAL HIGH (ref 0–44)
AST: 324 U/L — ABNORMAL HIGH (ref 15–41)
Albumin: 2 g/dL — ABNORMAL LOW (ref 3.5–5.0)
Alkaline Phosphatase: 246 U/L — ABNORMAL HIGH (ref 38–126)
Anion gap: 10 (ref 5–15)
BUN: 66 mg/dL — ABNORMAL HIGH (ref 8–23)
CO2: 23 mmol/L (ref 22–32)
Calcium: 7.3 mg/dL — ABNORMAL LOW (ref 8.9–10.3)
Chloride: 101 mmol/L (ref 98–111)
Creatinine, Ser: 2.35 mg/dL — ABNORMAL HIGH (ref 0.61–1.24)
GFR, Estimated: 27 mL/min — ABNORMAL LOW (ref >60)
Glucose, Bld: 273 mg/dL — ABNORMAL HIGH (ref 70–99)
Potassium: 5.9 mmol/L — ABNORMAL HIGH (ref 3.5–5.1)
Sodium: 134 mmol/L — ABNORMAL LOW (ref 135–145)
Total Bilirubin: 0.9 mg/dL (ref 0.3–1.2)
Total Protein: 6 g/dL — ABNORMAL LOW (ref 6.5–8.1)

## 2020-10-27 LAB — RENAL FUNCTION PANEL
Albumin: 2.1 g/dL — ABNORMAL LOW (ref 3.5–5.0)
Albumin: 2.2 g/dL — ABNORMAL LOW (ref 3.5–5.0)
Anion gap: 15 (ref 5–15)
Anion gap: 10 (ref 5–15)
BUN: 86 mg/dL — ABNORMAL HIGH (ref 8–23)
BUN: 68 mg/dL — ABNORMAL HIGH (ref 8–23)
CO2: 22 mmol/L (ref 22–32)
CO2: 25 mmol/L (ref 22–32)
Calcium: 7.7 mg/dL — ABNORMAL LOW (ref 8.9–10.3)
Calcium: 7.8 mg/dL — ABNORMAL LOW (ref 8.9–10.3)
Chloride: 102 mmol/L (ref 98–111)
Chloride: 101 mmol/L (ref 98–111)
Creatinine, Ser: 3.03 mg/dL — ABNORMAL HIGH (ref 0.61–1.24)
Creatinine, Ser: 2.36 mg/dL — ABNORMAL HIGH (ref 0.61–1.24)
GFR, Estimated: 20 mL/min — ABNORMAL LOW (ref >60)
GFR, Estimated: 27 mL/min — ABNORMAL LOW (ref >60)
Glucose, Bld: 134 mg/dL — ABNORMAL HIGH (ref 70–99)
Glucose, Bld: 182 mg/dL — ABNORMAL HIGH (ref 70–99)
Phosphorus: 8.2 mg/dL — ABNORMAL HIGH (ref 2.5–4.6)
Phosphorus: 5.7 mg/dL — ABNORMAL HIGH (ref 2.5–4.6)
Potassium: 4.5 mmol/L (ref 3.5–5.1)
Potassium: 5.3 mmol/L — ABNORMAL HIGH (ref 3.5–5.1)
Sodium: 139 mmol/L (ref 135–145)
Sodium: 136 mmol/L (ref 135–145)

## 2020-10-27 LAB — CBC WITH DIFFERENTIAL/PLATELET
Abs Immature Granulocytes: 0.19 10*3/uL — ABNORMAL HIGH (ref 0.00–0.07)
Basophils Absolute: 0 10*3/uL (ref 0.0–0.1)
Basophils Relative: 0 %
Eosinophils Absolute: 0.1 10*3/uL (ref 0.0–0.5)
Eosinophils Relative: 1 %
HCT: 23.5 % — ABNORMAL LOW (ref 39.0–52.0)
Hemoglobin: 7.1 g/dL — ABNORMAL LOW (ref 13.0–17.0)
Immature Granulocytes: 2 %
Lymphocytes Relative: 8 %
Lymphs Abs: 1 10*3/uL (ref 0.7–4.0)
MCH: 28.3 pg (ref 26.0–34.0)
MCHC: 30.2 g/dL (ref 30.0–36.0)
MCV: 93.6 fL (ref 80.0–100.0)
Monocytes Absolute: 0.5 10*3/uL (ref 0.1–1.0)
Monocytes Relative: 4 %
Neutro Abs: 11.2 10*3/uL — ABNORMAL HIGH (ref 1.7–7.7)
Neutrophils Relative %: 85 %
Platelets: 262 10*3/uL (ref 150–400)
RBC: 2.51 MIL/uL — ABNORMAL LOW (ref 4.22–5.81)
RDW: 13.1 % (ref 11.5–15.5)
WBC: 13 10*3/uL — ABNORMAL HIGH (ref 4.0–10.5)
nRBC: 0.4 % — ABNORMAL HIGH (ref 0.0–0.2)

## 2020-10-27 LAB — ECHOCARDIOGRAM LIMITED
Height: 67 in
Weight: 2395.08 oz

## 2020-10-27 LAB — LACTIC ACID, PLASMA
Lactic Acid, Venous: 2 mmol/L (ref 0.5–1.9)
Lactic Acid, Venous: 1.5 mmol/L (ref 0.5–1.9)

## 2020-10-27 LAB — CBC
HCT: 26.6 % — ABNORMAL LOW (ref 39.0–52.0)
Hemoglobin: 8.4 g/dL — ABNORMAL LOW (ref 13.0–17.0)
MCH: 28.7 pg (ref 26.0–34.0)
MCHC: 31.6 g/dL (ref 30.0–36.0)
MCV: 90.8 fL (ref 80.0–100.0)
Platelets: 241 10*3/uL (ref 150–400)
RBC: 2.93 MIL/uL — ABNORMAL LOW (ref 4.22–5.81)
RDW: 13.4 % (ref 11.5–15.5)
WBC: 9.9 10*3/uL (ref 4.0–10.5)
nRBC: 0 % (ref 0.0–0.2)

## 2020-10-27 LAB — MAGNESIUM: Magnesium: 2.4 mg/dL (ref 1.7–2.4)

## 2020-10-27 LAB — GLUCOSE, CAPILLARY
Glucose-Capillary: 141 mg/dL — ABNORMAL HIGH (ref 70–99)
Glucose-Capillary: 132 mg/dL — ABNORMAL HIGH (ref 70–99)
Glucose-Capillary: 144 mg/dL — ABNORMAL HIGH (ref 70–99)
Glucose-Capillary: 150 mg/dL — ABNORMAL HIGH (ref 70–99)
Glucose-Capillary: 156 mg/dL — ABNORMAL HIGH (ref 70–99)
Glucose-Capillary: 327 mg/dL — ABNORMAL HIGH (ref 70–99)

## 2020-10-27 LAB — APTT
aPTT: 45 seconds — ABNORMAL HIGH (ref 24–36)
aPTT: 46 seconds — ABNORMAL HIGH (ref 24–36)

## 2020-10-27 LAB — PREPARE RBC (CROSSMATCH)

## 2020-10-27 LAB — PROTIME-INR
INR: 1.4 — ABNORMAL HIGH (ref 0.8–1.2)
Prothrombin Time: 16.6 seconds — ABNORMAL HIGH (ref 11.4–15.2)

## 2020-10-27 MED ORDER — "THROMBI-PAD 3""X3"" EX PADS"
1.0000 | MEDICATED_PAD | Freq: Once | CUTANEOUS | Status: AC
  Administered 2020-10-27: 04:00:00 1 via TOPICAL
  Filled 2020-10-27: qty 1

## 2020-10-27 MED ORDER — PRISMASOL BGK 4/2.5 32-4-2.5 MEQ/L REPLACEMENT SOLN: Status: DC

## 2020-10-27 MED ORDER — NOREPINEPHRINE 4 MG/250ML-% IV SOLN
0.0000 ug/min | INTRAVENOUS | Status: DC
  Administered 2020-10-27: 21:00:00 2 ug/min via INTRAVENOUS
  Administered 2020-10-28: 10:00:00 5 ug/min via INTRAVENOUS
  Filled 2020-10-27 (×2): qty 250

## 2020-10-27 MED ORDER — PHENYLEPHRINE HCL-NACL 10-0.9 MG/250ML-% IV SOLN
0.0000 ug/min | INTRAVENOUS | Status: DC
  Administered 2020-10-27: 20:00:00 10 ug/min via INTRAVENOUS
  Filled 2020-10-27: qty 250

## 2020-10-27 MED ORDER — FENTANYL CITRATE (PF) 100 MCG/2ML IJ SOLN
25.0000 ug | INTRAMUSCULAR | Status: AC | PRN
  Administered 2020-10-28 – 2020-10-29 (×3): 25 ug via INTRAVENOUS
  Filled 2020-10-27 (×3): qty 2

## 2020-10-27 MED ORDER — SODIUM CHLORIDE 0.9% IV SOLUTION: Freq: Once | INTRAVENOUS | Status: AC

## 2020-10-27 MED ORDER — PRISMASOL BGK 0/2.5 32-2.5 MEQ/L EC SOLN
Status: DC
  Filled 2020-10-27 (×10): qty 5000

## 2020-10-27 MED ORDER — PRISMASOL BGK 0/2.5 32-2.5 MEQ/L EC SOLN
Status: DC
  Filled 2020-10-27 (×30): qty 5000

## 2020-10-27 MED ORDER — FENTANYL CITRATE (PF) 100 MCG/2ML IJ SOLN
25.0000 ug | INTRAMUSCULAR | Status: DC | PRN
Start: 2020-10-27 — End: 2020-11-01
  Administered 2020-10-28: 50 ug via INTRAVENOUS
  Administered 2020-10-29 – 2020-10-30 (×15): 100 ug via INTRAVENOUS
  Administered 2020-10-30: 50 ug via INTRAVENOUS
  Administered 2020-10-30 – 2020-10-31 (×3): 100 ug via INTRAVENOUS
  Administered 2020-10-31: 25 ug via INTRAVENOUS
  Filled 2020-10-27 (×21): qty 2

## 2020-10-27 MED ORDER — DOCUSATE SODIUM 50 MG/5ML PO LIQD
100.0000 mg | Freq: Two times a day (BID) | ORAL | Status: DC
  Administered 2020-10-29 – 2020-10-30 (×4): 100 mg
  Filled 2020-10-27 (×4): qty 10

## 2020-10-27 MED ORDER — POLYETHYLENE GLYCOL 3350 17 G PO PACK
17.0000 g | PACK | Freq: Every day | ORAL | Status: DC
  Administered 2020-10-29 – 2020-10-30 (×2): 17 g
  Filled 2020-10-27 (×2): qty 1

## 2020-10-27 MED ORDER — LACTATED RINGERS IV BOLUS
500.0000 mL | Freq: Once | INTRAVENOUS | Status: AC
  Administered 2020-10-27: 19:00:00 500 mL via INTRAVENOUS

## 2020-10-27 MED ORDER — IPRATROPIUM-ALBUTEROL 0.5-2.5 (3) MG/3ML IN SOLN
3.0000 mL | Freq: Four times a day (QID) | RESPIRATORY_TRACT | Status: DC | PRN
  Administered 2020-10-29: 20:00:00 3 mL via RESPIRATORY_TRACT

## 2020-10-27 MED ORDER — IOHEXOL 350 MG/ML SOLN
100.0000 mL | Freq: Once | INTRAVENOUS | Status: AC | PRN
  Administered 2020-10-27: 100 mL via INTRAVENOUS

## 2020-10-27 MED ORDER — CLOTRIMAZOLE 10 MG MT TROC
10.0000 mg | Freq: Three times a day (TID) | OROMUCOSAL | Status: AC
  Administered 2020-10-27 – 2020-10-28 (×5): 10 mg via ORAL
  Filled 2020-10-27 (×6): qty 1

## 2020-10-27 NOTE — Progress Notes (Signed)
Dr. Lamonte Sakai paged, made aware of pt decrease in BP. Fluid removal rate down to zero on CRRT. MD stated to watch patient over one hour, if BP not improved to page MD again.

## 2020-10-27 NOTE — Progress Notes (Signed)
Patient ID: Dennis Carey, male   DOB: Aug 03, 1935, 85 y.o.   MRN: [id number]     Advanced Heart Failure Rounding Note   Subjective:    12/7 Bronch with copious secretions that were removed.  12/8 Extubated but required re-intubation.  12/15 started HD 12/17 switched to CVVHD  Remains intubated. Now on PS. Switched to CVVHD last night. More alert following commands but very weak. CXR has cleared   SBP 130s Off pressors.   Objective:   Weight Range:  Vital Signs:   Temp:  [98 F (36.7 C)-99.3 F (37.4 C)] 98 F (36.7 C) (12/18 0742) Pulse Rate:  [86-103] 95 (12/18 1000) Resp:  [21-32] 26 (12/18 1000) BP: (110-197)/(51-75) 132/70 (12/18 1000) SpO2:  [97 %-100 %] 99 % (12/18 1000) Arterial Line BP: (148-223)/(55-104) 177/59 (12/18 1000) FiO2 (%):  [40 %] 40 % (12/18 0800) Weight:  [67.9 kg] 67.9 kg (12/18 0500) Last BM Date: 10/27/20  Weight change: Filed Weights   10/26/20 0402 10/26/20 0730 10/27/20 0500  Weight: 66 kg 66 kg 67.9 kg    Intake/Output:   Intake/Output Summary (Last 24 hours) at 10/27/2020 1016 Last data filed at 10/27/2020 1000 Gross per 24 hour  Intake 2272.68 ml  Output 2447 ml  Net -174.32 ml     PHYSICAL EXAM: General:  Intubated. Awake and following commands but very weak NAD HEENT: normal +ETT/NG Neck: supple. RIJ trialysis Carotids 2+ bilat; no bruits. No lymphadenopathy or thryomegaly appreciated. Cor: PMI nondisplaced. Regular rate & rhythm. No rubs, gallops or murmurs. Lungs: clear Abdomen: soft, nontender, nondistended. No hepatosplenomegaly. No bruits or masses. Good bowel sounds. Extremities: no cyanosis, clubbing, rash, edema Neuro: intubated  Awake and following commands but very weak moves LLE and RUE less than others  Telemetry: Sinus 90-100 Personally reviewed  Labs: Basic Metabolic Panel: Recent Labs  Lab 10/22/20 0641 10/23/20 0324 10/23/20 1415 10/24/20 0440 10/25/20 0422 10/26/20 0456 10/26/20 1602  10/27/20 0355  NA 148* 149*   < > 150* 146* 147* 141 139  K 4.4 5.2*   < > 3.9 4.2 4.9 4.2 4.5  CL 113* 114*   < > 110 107 109 103 102  CO2 20* 22   < > 22 24 20* 24 22  GLUCOSE 212* 236*   < > 288* 231* 232* 223* 134*  BUN 84* 103*   < > 124* 97* 139* 83* 86*  CREATININE 2.75* 3.31*   < > 3.88* 3.23* 4.71* 3.24* 3.03*  CALCIUM 7.5* 7.9*   < > 7.8* 7.7* 7.7* 7.5* 7.7*  MG 2.3 2.8*  --  2.7* 2.5*  --   --  2.4  PHOS 6.4* 8.4*  --  8.2* 6.5*  --   --  8.2*   < > = values in this interval not displayed.    Liver Function Tests: Recent Labs  Lab 10/27/20 0355  ALBUMIN 2.1*   No results for input(s): LIPASE, AMYLASE in the last 168 hours. No results for input(s): AMMONIA in the last 168 hours.  CBC: Recent Labs  Lab 10/23/20 0324 10/24/20 0440 10/25/20 0422 10/26/20 0456 10/27/20 0355  WBC 14.0* 12.1* 12.3* 10.1 9.9  HGB 7.0* 9.5* 9.3* 8.7* 8.4*  HCT 23.8* 30.5* 28.5* 28.0* 26.6*  MCV 97.5 93.6 90.8 92.7 90.8  PLT 228 268 251 241 241    Cardiac Enzymes: No results for input(s): CKTOTAL, CKMB, CKMBINDEX, TROPONINI in the last 168 hours.  BNP: BNP (last 3 results) Recent Labs  10/13/20 1513  BNP 589.0*    ProBNP (last 3 results) No results for input(s): PROBNP in the last 8760 hours.    Other results:  Imaging: No results found.   Medications:     Scheduled Medications: . amiodarone  200 mg Per Tube BID  . aspirin  81 mg Per Tube Daily  . bethanechol  10 mg Per Tube TID  . chlorhexidine gluconate (MEDLINE KIT)  15 mL Mouth Rinse BID  . Chlorhexidine Gluconate Cloth  6 each Topical Daily  . clotrimazole  10 mg Oral TID  . ezetimibe  10 mg Per Tube Daily  . feeding supplement (PROSource TF)  45 mL Per Tube BID  . free water  200 mL Per Tube Q4H  . heparin injection (subcutaneous)  5,000 Units Subcutaneous Q8H  . insulin aspart  0-15 Units Subcutaneous Q4H  . insulin aspart  5 Units Subcutaneous Q4H  . insulin detemir  20 Units Subcutaneous BID  .  ipratropium-albuterol  3 mL Nebulization Q6H  . mouth rinse  15 mL Mouth Rinse 10 times per day  . metoCLOPramide (REGLAN) injection  5 mg Intravenous Q8H  . pantoprazole sodium  40 mg Per Tube Q1200  . rosuvastatin  20 mg Per Tube Daily  . sodium chloride flush  10-40 mL Intracatheter Q12H  . sodium chloride flush  10-40 mL Intracatheter Q12H  . sodium chloride flush  3 mL Intravenous Q12H    Infusions: .  prismasol BGK 4/2.5    .  prismasol BGK 4/2.5    . sodium chloride Stopped (10/26/20 0828)  . sodium chloride Stopped (10/18/20 1958)  . sodium chloride    . sodium chloride    . feeding supplement (VITAL 1.5 CAL) 1,000 mL (10/26/20 1441)  . heparin 10,000 units/ 20 mL infusion syringe 400 Units/hr (10/26/20 2100)  . prismasol BGK 4/2.5 1,000 mL/hr at 10/27/20 0825    PRN Medications: Place/Maintain arterial line **AND** sodium chloride, sodium chloride, sodium chloride, sodium chloride, acetaminophen (TYLENOL) oral liquid 160 mg/5 mL, alteplase, heparin, heparin, heparin, heparin, heparin, ondansetron **OR** ondansetron (ZOFRAN) IV, sodium chloride flush, sodium chloride flush   Assessment/Plan:   1. Acute Hypoxemic Respiratory Failure - due to PNA and HF - Developed acute respiratory failure on 12/4 and required intubation.  - CXR with pulmonary edema + infiltrates on 12/4.  - PCT 109 -> 84->43 -> 18 - Extubated 12/8 but required re intubation.  - Sputum cultures w/ normal respiratory flora.  - Completed vanc/meropenem - PNA much improved. Main barrier to extubation is his mental status but this is complicated by uremia. - Off sedation ~ 5 days so far - BUN and mental status improving with CVVHD. Willcontinue. D/w Renal  -CXR and mental status improving but remains to be seen if he will have muscle strength/endurance to support extubation will have to decide on trach early next week  2. Shock - likely sepsis/cardiogenic combined. Good co-ox yesterday, pending today.  -  Echo- reduced EF with EF 25-30% and WMA. RV normal.   - Now off NE.  - Completed vanc/mero on 12/15 - Cx all negative - Having low-grade fevers again. Will need to follow closely. Low threshold to reculture.   3. Acute Systolic Heart Failure  - As above Echo with EF 25-30% . No previous ECHO. No previous cath.  - Hs Trop [phone]. - RHC 12/6 with low filling pressures after diuresis and low index 1.9. SVR 1467 - Unable to add GDMT due to shock/ARF.  Can add soon - Management as above - Suspect combination of septic and cardiogenic shock. Troponin moderately elevated but flat. Possibility of Tako-tsubo/septic CM has been raised and seems to fit clinically but cannot rule out CAD.  If he recovers and creatinine comes down, needs coronary angiography.  - Will repeat echo  4. Back Pain--> S/P L5-S1 laminectomy with fusion 09/28/2020  Per Neurosugery  5. AKI - Started HD on 12/15. Plan as above  6. Ileus - Improved, has had BM though still distended.    7. Hypernatremia - Na 139 - continue free water boluses. Adjust with HD  8. Anemia - Hgb 8.4 today.  - monitor, transfuse for hgb <7.0   9. Encephalopathy, metabolic - Improving with CVVHD - CT ok  10. Debility - suspect ICU myopathy - PT/OT consults    CRITICAL CARE Performed by: Glori Bickers  Total critical care time: 40 minutes  Critical care time was exclusive of separately billable procedures and treating other patients.  Critical care was necessary to treat or prevent imminent or life-threatening deterioration.  Critical care was time spent personally by me (independent of midlevel providers or residents) on the following activities: development of treatment plan with patient and/or surrogate as well as nursing, discussions with consultants, evaluation of patient's response to treatment, examination of patient, obtaining history from patient or surrogate, ordering and performing treatments and  interventions, ordering and review of laboratory studies, ordering and review of radiographic studies, pulse oximetry and re-evaluation of patient's condition.    Length of Stay: 17  Glori Bickers, MD  10:16 AM

## 2020-10-27 NOTE — Progress Notes (Signed)
Patient had made significant progress earlier today.  He was awake and following commands.  He had improved cardiac function on his echocardiogram and he seemed to be tolerating his dialysis well.  Unfortunately earlier this evening the patient became progressively less responsive and has become significantly hypotensive.  Etiology behind this is unknown however most likely this is secondary to fluid shifts with his continuous dialysis.  He is currently being resuscitated and treated with pressors.  Head CT scan is pending.  I am deferring management to critical care at this point.

## 2020-10-27 NOTE — Progress Notes (Signed)
Echocardiogram 2D Echocardiogram has been performed.  Oneal Deputy Jouri Threat 10/27/2020, 12:09 PM

## 2020-10-27 NOTE — Plan of Care (Signed)
RP hematoma on CT scan. Giving 2 units pRBCs; will transfuse until off pressors given concern for hemorrhagic rather than septic or obstructive shock. D/c [place] heparin. RN to discuss with nephrology if heparin in CVVHD circuit can be changed. Son to be updated by NP Rosana Hoes.   Julian Hy, DO 10/27/20 10:51 PM [facility]

## 2020-10-27 NOTE — Progress Notes (Signed)
[facility] Progress Note Patient Name: Dennis Carey DOB: [birth year] MRN: [id number]   Date of Service  10/27/2020  HPI/Events of Note  Head CT Scan w/o contrast reveals: Chronic small vessel disease without acute intracranial abnormality. Chest CTA reveals:  1. Large left-sided retroperitoneal hematoma, incompletely evaluated on this study due to slice selection. 2. No evidence of pulmonary embolus. 3. Multifocal bilateral ground-glass airspace disease which may reflect atypical pneumonia. 4. Small bilateral pleural effusions. Hgb = 6.8.   eICU Interventions  Issues addressed already by PCCM ground team.      Intervention Category Major Interventions: Other:  Lysle Dingwall 10/27/2020, 10:55 PM

## 2020-10-27 NOTE — Progress Notes (Signed)
Patient transported to CT and back without any complications.  

## 2020-10-27 NOTE — Progress Notes (Signed)
[facility] Progress Note Patient Name: Dennis Carey DOB: 12-18-1934 MRN: [id number]   Date of Service  10/27/2020  HPI/Events of Note  Multiple issues: 1. Change in mental status - Patient was able to follow commands earlier today. Now not following commands and not reponding. Pupils not equal. R = 3 mm and L = 4 mm. Blood glucose = 157. BP = 86/43. Getting a fluid bolus for CVP = 2.   eICU Interventions  Plan: 1. Phenylephrine IV infusion. Titrate to MAP >= 65.  2. Recheck CVP post fluid bolus.  3. Head CT Scan STAT. 4. ABG STAT. 4. Will ask ground team to evaluate the patient at bedside.     Intervention Category Major Interventions: Change in mental status - evaluation and management;Hypotension - evaluation and management  Lysle Dingwall 10/27/2020, 7:47 PM

## 2020-10-27 NOTE — Progress Notes (Signed)
RT note. Pt. Flipped to PS/CPAP due to asynchronous /peak pressure. Pt. Looks more comfortable and reaching volumes of 600-735ml. RN aware, and Dr. Lucile Shutters aware, order given verbal. RN told to let RT know if pt. Starts to look tired or increase WOB, RT will continue to monitor.

## 2020-10-27 NOTE — Progress Notes (Addendum)
Dr. Lamonte Sakai paged again r/t pt SBP in 70s. MD ordered 500cc LR bolus @ 500cc/hr x1.

## 2020-10-27 NOTE — Progress Notes (Signed)
[facility] Progress Note Patient Name: Dennis Carey DOB: 02/08/1935 MRN: [id number]   Date of Service  10/27/2020  HPI/Events of Note  ABG on 40%/PRVC 35/TV 520/P 8 = 7.303/51.6/197.  eICU Interventions  Continue present ventilator management.      Intervention Category Major Interventions: Acid-Base disturbance - evaluation and management;Respiratory failure - evaluation and management  Lysle Dingwall 10/27/2020, 10:53 PM

## 2020-10-27 NOTE — Progress Notes (Signed)
Pt placed on SBT CPAP/PS 5/5 40%  From 10/27/2020 0200 to 10/27/2020 1455. Pt placed back on full support settings due to increased minute volume (23-26) and increased respiratory rate ( 36-42). Upon placement to full support RR decreased to 29 and minute volume back to 15. RT will continue to monitor.

## 2020-10-27 NOTE — Progress Notes (Signed)
Patient ID: Dennis Carey, male   DOB: 1934/12/09, 85 y.o.   MRN: [id number] [facility] Progress Note   Assessment/ Plan:   1.  Acute kidney injury on chronic kidney disease stage III: Likely hemodynamically mediated with CHF exacerbation/ongoing diuresis with evolution to mild ATN.  He is nonoliguric and projected to have a slow/delayed renal recovery.  He is now on CRRT rather than intermittent hemodialysis to help aggressively manage his azotemia and optimize him for extubation as his mental status improves. 2.  Hypernatremia: Secondary to free water deficit in this intubated patient with increased insensible losses and ongoing diuresis.  Improved with free water flushes, monitor with CRRT. 3.  Anemia: Appears to be consistent with anemia of chronic illness-monitor for indications for PRBC. 4.  Encephalopathy: Suspected to be multifactorial but primarily from severe azotemia-continue hemodialysis. 5.  Acute hypoxic respiratory failure: Suspected multifactorial from HCAP and acute pulmonary edema from CHF exacerbation.    Appears to be close to euvolemic on physical exam, will keep him net even with CRRT.  Subjective:   CRRT started last night (2100) to help augment management of severe azotemia and get him towards extubation.  Improving mental status noted.    Objective:   BP 129/69   Pulse 95   Temp 98.5 F (36.9 C) (Axillary)   Resp (!) 23   Ht _0  (1.702 m)   Wt 67.9 kg   SpO2 100%   BMI 23.45 kg/m   Intake/Output Summary (Last 24 hours) at 10/27/2020 0725 Last data filed at 10/27/2020 0700 Gross per 24 hour  Intake 2037.68 ml  Output 2234 ml  Net -196.32 ml   Weight change: 0 kg  Physical Exam: Gen: Increased responsiveness-turns eyes towards calling out his name/voice. CVS: Pulse regular rhythm, normal rate, S1 and S2 normal without any murmurs, rub or gallop Resp: Anteriorly clear to auscultation, no distinct rales or rhonchi Abd: Soft, flat,  nontender Ext: 1+ upper extremity edema, trace dependent edema.  No lower extremity edema.  Imaging: No results found.  Labs: BMET Recent Labs  Lab 10/21/20 0500 10/21/20 0612 10/22/20 0641 10/23/20 0324 10/23/20 1415 10/24/20 0440 10/25/20 0422 10/26/20 0456 10/26/20 1602 10/27/20 0355  NA 142   < > 148* 149* 151* 150* 146* 147* 141 139  K 4.1   < > 4.4 5.2* 4.1 3.9 4.2 4.9 4.2 4.5  CL 112*  --  113* 114* 115* 110 107 109 103 102  CO2 18*  --  20* 22 20* 22 24 20* 24 22  GLUCOSE 138*  --  212* 236* 199* 288* 231* 232* 223* 134*  BUN 71*  --  84* 103* 108* 124* 97* 139* 83* 86*  CREATININE 2.37*  --  2.75* 3.31* 3.49* 3.88* 3.23* 4.71* 3.24* 3.03*  CALCIUM 7.8*  --  7.5* 7.9* 8.0* 7.8* 7.7* 7.7* 7.5* 7.7*  PHOS 5.5*  --  6.4* 8.4*  --  8.2* 6.5*  --   --  8.2*   < > = values in this interval not displayed.   CBC Recent Labs  Lab 10/24/20 0440 10/25/20 0422 10/26/20 0456 10/27/20 0355  WBC 12.1* 12.3* 10.1 9.9  HGB 9.5* 9.3* 8.7* 8.4*  HCT 30.5* 28.5* 28.0* 26.6*  MCV 93.6 90.8 92.7 90.8  PLT 268 251 241 241    Medications:    . amiodarone  200 mg Per Tube BID  . aspirin  81 mg Per Tube Daily  . bethanechol  10 mg Per  Tube TID  . chlorhexidine gluconate (MEDLINE KIT)  15 mL Mouth Rinse BID  . Chlorhexidine Gluconate Cloth  6 each Topical Daily  . ezetimibe  10 mg Per Tube Daily  . feeding supplement (PROSource TF)  45 mL Per Tube BID  . free water  200 mL Per Tube Q4H  . heparin injection (subcutaneous)  5,000 Units Subcutaneous Q8H  . insulin aspart  0-15 Units Subcutaneous Q4H  . insulin aspart  5 Units Subcutaneous Q4H  . insulin detemir  20 Units Subcutaneous BID  . ipratropium-albuterol  3 mL Nebulization Q6H  . mouth rinse  15 mL Mouth Rinse 10 times per day  . metoCLOPramide (REGLAN) injection  5 mg Intravenous Q8H  . pantoprazole sodium  40 mg Per Tube Q1200  . rosuvastatin  20 mg Per Tube Daily  . sodium chloride flush  10-40 mL Intracatheter  Q12H  . sodium chloride flush  10-40 mL Intracatheter Q12H  . sodium chloride flush  3 mL Intravenous Q12H   Elmarie Shiley, MD 10/27/2020, 7:25 AM

## 2020-10-27 NOTE — Progress Notes (Signed)
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  Seem to respond nicely to titration of PEEP.  Changed sedation to propofol and fentanyl.  Broad-spectrum antibiotics continued.  Primary barriers to progress seem to be atelectasis, adynamic ileus, resolving pneumonia, and severe deconditioning all superimposed on his underlying stress cardiomyopathy   Consults:  Neurosurgery Cardiology  Procedures:  10/13/20 Intubation 12/5 bronch 12/15 > R IJ HD cath  Significant Diagnostic Tests:  10/11/20 Chest X-ray, minimal left basilar atelectasis 12/3 LE venous doppler: neg 12/4 echo: LVEF 25-30% 12/10 KUB: Consistent with colonic ileus-gas present down to rectum 12/13 Renal US > mild perinephric fluid, no obstruction.  Micro Data:  10/17/2020 COVID, Flu neg 12/4:  MRSA PCR neg 12/4 Bronch tracheal aspirate - GNR, GPC growing 12/4 blood: ngtd  Antimicrobials:  Vanc 12/4 > 12/13 Cefepime 12/4 > 12/10 Doxycycline 12/6 > 2/9 Merrem 12/10 > 12/16  Interim history/subjective:   Tolerated intermittent HD on 12/17, note plan to initiate CVVH.  BUN down to 86 Has done pressure support, PEEP decreased to 512/17 I/O- 4.7 L total Currently tolerating PS 5   Objective   Blood pressure 126/63, pulse 97, temperature 98 F (36.7 C), temperature source Axillary, resp. rate (!) 23, height 5\' 7"  (1.702 m), weight 67.9 kg, SpO2 100 %. CVP:  [4 mmHg-7 mmHg] 7 mmHg  Vent Mode: PSV;CPAP FiO2 (%):  [40 %] 40 % Set Rate:  [20 bmp] 20 bmp Vt Set:  [520  mL] 520 mL PEEP:  [8 cmH20] 8 cmH20 Pressure Support:  [5 cmH20-8 cmH20] 8 cmH20 Plateau Pressure:  [21 cmH20] 21 cmH20   Intake/Output Summary (Last 24 hours) at 10/27/2020 0753 Last data filed at 10/27/2020 0700 Gross per 24 hour  Intake 2037.68 ml  Output 2234 ml  Net -196.32 ml   Filed Weights   10/26/20 0402 10/26/20 0730 10/27/20 0500  Weight: 66 kg 66 kg 67.9 kg   Physical Exam: General: Acute and chronically ill-appearing man, no distress, ventilated Neuro: A bit more awake, opens eyes, follows commands.  He coughed on command, marginal cough strength  HEENT: ET tube in place, oropharynx clear, pupils equal Cardiovascular: Distant, regular, no murmur Lungs: No wheezing, no crackles Abdomen: Nondistended, positive bowel sounds Musculoskeletal: No edema Skin: Warm, no rash  Assessment & Plan:   L5-S1 DJD s/p laminectomy/decompression -Postop care as per neurosurgery recommendations, plans  Acute hypoxic respiratory failure due to acute pulm edema and likely aspiration pneumonitis vs HCAP (s/p 10 days vanc and 7 days merrem). -PEEP weaned to 5 on 12/17, tolerating -PSV as he can tolerate, does not have the mental status for extubation at this time.  Question whether he would be a tracheostomy candidate depending on pace of improvement -Completed meropenem, vancomycin.  Follow-up for any evidence infection, cultures negative thus far -RASS goal 0 to -1, minimizing all sedation  Acute metabolic encephalopathy, appears to be slowly improving -Minimize sedation as above -Continue to correct underlying metabolic issues particular his uremia  AKI due to ATN, Obstruction was ruled out by negative  venous ultrasound -Appreciate nephrology management.  Intermittent HD transition to CVVHD -Follow urine output (still making urine), BMP closely -Renally dose medications  Hypernatremia - 2/2 albumin + lasix. -Free water as ordered -Keeping patient even I/O with HD -Follow  BMP  Type 2 diabetes, well controlled -Sliding scale insulin, Levemir as ordered  Anemia of acute illness, Has received blood transfusion couple of times during this admission -Following CBC -Transfusion goal hemoglobin 7  Generalized deconditioning. -PT/OT when able.  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 -Aspirin, amiodarone   Best practice (evaluated daily)  Diet: TF Pain/Anxiety/Delirium protocol (if indicated): Hold all sedation. VAP protocol (if indicated): ordered DVT prophylaxis: Subcu heparin GI prophylaxis: PPI Glucose control: SSI, levemir Mobility: BR Code Status: Full Communication: Son updated at bedside 12/18. Disposition: ICU   Total critical care time: 32 minutes   Critical care time was exclusive of separately billable procedures and treating other patients.   Critical care was necessary to treat or prevent imminent or life-threatening deterioration.   Critical care was time spent personally by me on the following activities: development of treatment plan with patient and/or surrogate as well as nursing, discussions with consultants, evaluation of patient's response to treatment, examination of patient, obtaining history from patient or surrogate, ordering and performing treatments and interventions, ordering and review of laboratory studies, ordering and review of radiographic studies, pulse oximetry and re-evaluation of patient's condition.    Baltazar Apo, MD, PhD 10/27/2020, 7:53 AM [facility] [phone] or if no answer [phone]

## 2020-10-28 ENCOUNTER — Inpatient Hospital Stay (HOSPITAL_COMMUNITY): Payer: Medicare Other

## 2020-10-28 DIAGNOSIS — R578 Other shock: Secondary | ICD-10-CM

## 2020-10-28 LAB — CBC
HCT: 29.7 % — ABNORMAL LOW (ref 39.0–52.0)
HCT: 31 % — ABNORMAL LOW (ref 39.0–52.0)
HCT: 27.7 % — ABNORMAL LOW (ref 39.0–52.0)
HCT: 27.2 % — ABNORMAL LOW (ref 39.0–52.0)
Hemoglobin: 9.8 g/dL — ABNORMAL LOW (ref 13.0–17.0)
Hemoglobin: 10.5 g/dL — ABNORMAL LOW (ref 13.0–17.0)
Hemoglobin: 9.5 g/dL — ABNORMAL LOW (ref 13.0–17.0)
Hemoglobin: 9.3 g/dL — ABNORMAL LOW (ref 13.0–17.0)
MCH: 29.2 pg (ref 26.0–34.0)
MCH: 29 pg (ref 26.0–34.0)
MCH: 29.3 pg (ref 26.0–34.0)
MCH: 29.2 pg (ref 26.0–34.0)
MCHC: 33 g/dL (ref 30.0–36.0)
MCHC: 33.9 g/dL (ref 30.0–36.0)
MCHC: 34.3 g/dL (ref 30.0–36.0)
MCHC: 34.2 g/dL (ref 30.0–36.0)
MCV: 88.4 fL (ref 80.0–100.0)
MCV: 85.6 fL (ref 80.0–100.0)
MCV: 85.5 fL (ref 80.0–100.0)
MCV: 85.5 fL (ref 80.0–100.0)
Platelets: 201 10*3/uL (ref 150–400)
Platelets: 173 10*3/uL (ref 150–400)
Platelets: 162 10*3/uL (ref 150–400)
Platelets: 170 10*3/uL (ref 150–400)
RBC: 3.36 MIL/uL — ABNORMAL LOW (ref 4.22–5.81)
RBC: 3.62 MIL/uL — ABNORMAL LOW (ref 4.22–5.81)
RBC: 3.24 MIL/uL — ABNORMAL LOW (ref 4.22–5.81)
RBC: 3.18 MIL/uL — ABNORMAL LOW (ref 4.22–5.81)
RDW: 13.7 % (ref 11.5–15.5)
RDW: 14.3 % (ref 11.5–15.5)
RDW: 14.5 % (ref 11.5–15.5)
RDW: 14.6 % (ref 11.5–15.5)
WBC: 17.5 10*3/uL — ABNORMAL HIGH (ref 4.0–10.5)
WBC: 17.2 10*3/uL — ABNORMAL HIGH (ref 4.0–10.5)
WBC: 18.8 10*3/uL — ABNORMAL HIGH (ref 4.0–10.5)
WBC: 19.9 10*3/uL — ABNORMAL HIGH (ref 4.0–10.5)
nRBC: 0.2 % (ref 0.0–0.2)
nRBC: 0.3 % — ABNORMAL HIGH (ref 0.0–0.2)
nRBC: 0.2 % (ref 0.0–0.2)
nRBC: 0.2 % (ref 0.0–0.2)

## 2020-10-28 LAB — RENAL FUNCTION PANEL
Albumin: 2 g/dL — ABNORMAL LOW (ref 3.5–5.0)
Albumin: 1.9 g/dL — ABNORMAL LOW (ref 3.5–5.0)
Anion gap: 11 (ref 5–15)
Anion gap: 12 (ref 5–15)
BUN: 83 mg/dL — ABNORMAL HIGH (ref 8–23)
BUN: 69 mg/dL — ABNORMAL HIGH (ref 8–23)
CO2: 21 mmol/L — ABNORMAL LOW (ref 22–32)
CO2: 20 mmol/L — ABNORMAL LOW (ref 22–32)
Calcium: 7.2 mg/dL — ABNORMAL LOW (ref 8.9–10.3)
Calcium: 7.2 mg/dL — ABNORMAL LOW (ref 8.9–10.3)
Chloride: 101 mmol/L (ref 98–111)
Chloride: 103 mmol/L (ref 98–111)
Creatinine, Ser: 2.6 mg/dL — ABNORMAL HIGH (ref 0.61–1.24)
Creatinine, Ser: 2.08 mg/dL — ABNORMAL HIGH (ref 0.61–1.24)
GFR, Estimated: 24 mL/min — ABNORMAL LOW (ref >60)
GFR, Estimated: 31 mL/min — ABNORMAL LOW (ref >60)
Glucose, Bld: 180 mg/dL — ABNORMAL HIGH (ref 70–99)
Glucose, Bld: 176 mg/dL — ABNORMAL HIGH (ref 70–99)
Phosphorus: 9.2 mg/dL — ABNORMAL HIGH (ref 2.5–4.6)
Phosphorus: 6.8 mg/dL — ABNORMAL HIGH (ref 2.5–4.6)
Potassium: 6 mmol/L — ABNORMAL HIGH (ref 3.5–5.1)
Potassium: 5.1 mmol/L (ref 3.5–5.1)
Sodium: 133 mmol/L — ABNORMAL LOW (ref 135–145)
Sodium: 135 mmol/L (ref 135–145)

## 2020-10-28 LAB — GLUCOSE, CAPILLARY
Glucose-Capillary: 257 mg/dL — ABNORMAL HIGH (ref 70–99)
Glucose-Capillary: 192 mg/dL — ABNORMAL HIGH (ref 70–99)
Glucose-Capillary: 119 mg/dL — ABNORMAL HIGH (ref 70–99)
Glucose-Capillary: 62 mg/dL — ABNORMAL LOW (ref 70–99)
Glucose-Capillary: 109 mg/dL — ABNORMAL HIGH (ref 70–99)
Glucose-Capillary: 217 mg/dL — ABNORMAL HIGH (ref 70–99)
Glucose-Capillary: 232 mg/dL — ABNORMAL HIGH (ref 70–99)

## 2020-10-28 LAB — HEPATIC FUNCTION PANEL
ALT: 388 U/L — ABNORMAL HIGH (ref 0–44)
AST: 203 U/L — ABNORMAL HIGH (ref 15–41)
Albumin: 2 g/dL — ABNORMAL LOW (ref 3.5–5.0)
Alkaline Phosphatase: 225 U/L — ABNORMAL HIGH (ref 38–126)
Bilirubin, Direct: 0.3 mg/dL — ABNORMAL HIGH (ref 0.0–0.2)
Indirect Bilirubin: 0.7 mg/dL (ref 0.3–0.9)
Total Bilirubin: 1 mg/dL (ref 0.3–1.2)
Total Protein: 6 g/dL — ABNORMAL LOW (ref 6.5–8.1)

## 2020-10-28 LAB — APTT: aPTT: 31 seconds (ref 24–36)

## 2020-10-28 LAB — LACTIC ACID, PLASMA: Lactic Acid, Venous: 1.4 mmol/L (ref 0.5–1.9)

## 2020-10-28 LAB — PREPARE RBC (CROSSMATCH)

## 2020-10-28 LAB — MAGNESIUM: Magnesium: 2.5 mg/dL — ABNORMAL HIGH (ref 1.7–2.4)

## 2020-10-28 LAB — AMMONIA: Ammonia: 35 umol/L (ref 9–35)

## 2020-10-28 MED ORDER — SODIUM CHLORIDE 0.9% IV SOLUTION: Freq: Once | INTRAVENOUS | Status: DC

## 2020-10-28 MED ORDER — PRISMASOL BGK 0/2.5 32-2.5 MEQ/L EC SOLN
Status: DC
  Filled 2020-10-28 (×6): qty 5000

## 2020-10-28 MED ORDER — SODIUM CHLORIDE 0.9 % IV SOLN
INTRAVENOUS | Status: DC | PRN
  Administered 2020-10-28 – 2020-10-30 (×2): 250 mL via INTRAVENOUS

## 2020-10-28 MED ORDER — FREE WATER
100.0000 mL | Status: DC
  Administered 2020-10-28 – 2020-10-31 (×13): 100 mL

## 2020-10-28 MED ORDER — DEXTROSE 50 % IV SOLN
12.5000 g | Freq: Once | INTRAVENOUS | Status: AC
  Administered 2020-10-28: 08:00:00 12.5 g via INTRAVENOUS
  Filled 2020-10-28: qty 50

## 2020-10-28 MED ORDER — NOREPINEPHRINE 16 MG/250ML-% IV SOLN
0.0000 ug/min | INTRAVENOUS | Status: DC
  Administered 2020-10-28: 12:00:00 4 ug/min via INTRAVENOUS
  Administered 2020-10-29: 05:00:00 14 ug/min via INTRAVENOUS
  Administered 2020-10-29: 22:00:00 16 ug/min via INTRAVENOUS
  Administered 2020-10-30: 23:00:00 15 ug/min via INTRAVENOUS
  Filled 2020-10-28 (×4): qty 250

## 2020-10-28 NOTE — Progress Notes (Signed)
Neurosurgery Service Progress Note  Subjective:  Shock yesterday, workup revealed retroperitoneal hematoma, responded to transfusions  Objective: Vitals:   10/28/20 1430 10/28/20 1444 10/28/20 1445 10/28/20 1500  BP: 100/68  95/63 94/60  Pulse: 73  74 74  Resp:  (!) 32 (!) 31   Temp:      TempSrc:      SpO2: 97% 97% 99% 99%  Weight:      Height:        Physical Exam: Intubated and sedated  Assessment & Plan: -no change in neurosurgical plan of care -f/u CCM / CHF / nephrology recs  Dennis Carey  10/28/20 3:21 PM

## 2020-10-28 NOTE — Progress Notes (Signed)
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  Seem to respond nicely to titration of PEEP.  Changed sedation to propofol and fentanyl.  Broad-spectrum antibiotics continued.  Primary barriers to progress seem to be atelectasis, adynamic ileus, resolving pneumonia, and severe deconditioning all superimposed on his underlying stress cardiomyopathy   Consults:  Neurosurgery Cardiology  Procedures:  10/13/20 Intubation 12/5 bronch 12/15 > R IJ HD cath  Significant Diagnostic Tests:  10/11/20 Chest X-ray, minimal left basilar atelectasis 12/3 LE venous doppler: neg 12/4 echo: LVEF 25-30% 12/10 KUB: Consistent with colonic ileus-gas present down to rectum 12/13 Renal US > mild perinephric fluid, no obstruction. 12/18 head CT >> no acute change CT-PA 12/18 >> no PE, bilateral effusions with associated basilar groundglass/atelectasis, new left retroperitoneal hemorrhage  Micro Data:  11/02/2020 COVID, Flu neg 12/4:  MRSA PCR neg 12/4 Bronch tracheal aspirate -normal flora 12/4 blood: Negative  Antimicrobials:  Vanc 12/4 > 12/13 Cefepime 12/4 > 12/10 Doxycycline 12/6 > 2/9 Merrem 12/10 > 12/16  Interim history/subjective:    Initially good day yesterday with improved mental status and ability to tolerate PSV 5. Later in the day developed hypotension and suppressed mental status.  UF decreased, IV fluids given.  Work-up included reassuring CT head CT-PA showed no PE, effusion with bibasilar groundglass, a large left-sided  retroperitoneal hematoma. Right upper quadrant ultrasound was normal   Objective   Blood pressure 100/69, pulse 88, temperature 98.5 F (36.9 C), temperature source Axillary, resp. rate (!) 36, height 5\' 7"  (1.702 m), weight 68.9 kg, SpO2 100 %. CVP:  [2 mmHg-7 mmHg] 6 mmHg  Vent Mode: PRVC FiO2 (%):  [40 %-60 %] 40 % Set Rate:  [20 bmp-35 bmp] 35 bmp Vt Set:  [520 mL] 520 mL PEEP:  [5 cmH20-8 cmH20] 8 cmH20 Pressure Support:  [5 cmH20] 5 cmH20 Plateau Pressure:  [27 cmH20] 27 cmH20   Intake/Output Summary (Last 24 hours) at 10/28/2020 0734 Last data filed at 10/28/2020 0600 Gross per 24 hour  Intake 3116.94 ml  Output 1445 ml  Net 1671.94 ml   Filed Weights   10/26/20 0730 10/27/20 0500 10/28/20 0500  Weight: 66 kg 67.9 kg 68.9 kg   Physical Exam: General: Acute and chronic ill-appearing man Neuro: Weaker today.  He will track, open eyes and weakly follow commands with bilateral upper extremities.  HEENT: ET tube in place, oropharynx clear but some pooling secretions, pupils equal, some scleral icterus present Cardiovascular: Distant, regular, no murmur Lungs: Clear bilaterally decreased at both bases Abdomen: Slightly distended, mild diffuse tenderness, positive bowel sounds Musculoskeletal: No edema Skin: No rash  Assessment & Plan:   L5-S1 DJD s/p laminectomy/decompression -Postop care as per neurosurgery recommendations, plans  Acute hypoxic respiratory failure due to acute pulm edema and likely aspiration pneumonitis vs HCAP (s/p 10 days vanc and 7 days merrem). -Wean PEEP to 5 -Correcting encephalopathy.  This and generalized weakness have been barriers to extubation -PSV as he can tolerate.  Actually performed very well 12/18 before impact of retroperitoneal bleeding None question  whether he would be a tracheostomy candidate depending on patient improvement -Completed his meropenem, vancomycin.  Following for any evidence of acute infection -Minimize sedating  medication  Acute metabolic encephalopathy, acutely worse 12/18 in the setting of hypotension.  Repeat CT head reassuring -Continue to minimize sedation as able.  Using fentanyl as needed -Continue to correct his underlying metabolic issues -Ensure adequate blood pressure, perfusion.  He had an acute decline 12/18, evaluation revealed retroperitoneal bleeding  AKI due to ATN, Obstruction was ruled out by negative venous ultrasound -Appreciate nephrology management, continuing CVVHD -Follow urine output (still making urine) -Follow BMP -Renal dose medications  Acute left retroperitoneal bleed -Follow CBC -Transfusion goal hemoglobin 8.0 given active blood loss  Hypernatremia - 2/2 albumin + lasix. -Continue Free water as ordered -Keeping patient even I/O with HD -Follow BMP  Type 2 diabetes, well controlled -Sliding scale insulin, Levemir as ordered  Anemia of acute illness and now due to retroperitoneal bleed 12/18, -Following CBC -Transfusion goal hemoglobin 8  Generalized deconditioning. 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 -Aspirin, amiodarone   Best practice (evaluated daily)  Diet: TF Pain/Anxiety/Delirium protocol (if indicated): Hold all sedation. VAP protocol (if indicated): ordered DVT prophylaxis: Subcu heparin GI prophylaxis: PPI Glucose control: SSI, levemir Mobility: BR Code Status: Full Communication: Wife updated at bedside 12/19 Disposition: ICU   Total critical care time: 33 minutes   Critical care time was exclusive of separately billable procedures and treating other patients.   Critical care was necessary to treat or prevent imminent or life-threatening deterioration.   Critical care was time spent personally by me on the following activities: development of treatment plan with patient and/or surrogate as  well as nursing, discussions with consultants, evaluation of patient's response to treatment, examination of patient, obtaining history from patient or surrogate, ordering and performing treatments and interventions, ordering and review of laboratory studies, ordering and review of radiographic studies, pulse oximetry and re-evaluation of patient's condition.    Baltazar Apo, MD, PhD 10/28/2020, 7:34 AM [facility] [phone] or if no answer [phone]

## 2020-10-28 NOTE — Progress Notes (Signed)
PCCM progress note  Updated patient's son, daughter, and spouse over the phone regarding CT results.  All questions answered and plan of care discussed.  Family is in agreement with blood transfusions to address hemorrhagic shock secondary to retroperitoneal bleed.  Provider team will continue to update family regarding any changes.  Johnsie Cancel, NP-C [facility] Contact / Pager information can be found on Amion  10/28/2020, 12:11 AM

## 2020-10-28 NOTE — Progress Notes (Signed)
OT Cancellation Note  Patient Details Name: Dennis Carey MRN: [id number] DOB: 1934-12-10   Cancelled Treatment:    Reason Eval/Treat Not Completed: Medical issues which prohibited therapy (Pt with large left retroperitoneal hematoma; RN hold- rest ordered for pt today. OT to continue to follow when medically appropriate for intervention.)   Jefferey Pica, OTR/L Acute Rehabilitation Services Pager: [phone] Office: [phone]   Cap Massi C 10/28/2020, 11:40 AM

## 2020-10-28 NOTE — Progress Notes (Signed)
Dr. Jonnie Finner notified of patients K+ of 6.0. Verbal order for all CRRT prisma bags to be 2K/2.5Ca

## 2020-10-28 NOTE — Plan of Care (Signed)
Patient with increasing vasopressor requirements. CVP 1, CRRT now able to run without excessive negative pressure after 500cc LR bolus. CVP remains 1 after bolus. Bedside US with hyperdynamic function, underfilled RV, collapsible IVC.   Plan: transfuse additional 2 units pRBCs for hemorrhagic shock due to RP bleed.  Julian Hy, DO 10/28/20 10:06 PM [facility]

## 2020-10-28 NOTE — Progress Notes (Addendum)
Patient ID: Dennis Carey, male   DOB: 01/30/1935, 85 y.o.   MRN: [id number]     Advanced Heart Failure Rounding Note   Subjective:    12/7 Bronch with copious secretions that were removed.  12/8 Extubated but required re-intubation.  12/15 started HD 12/17 switched to CVVHD 12/18 Large RP hemorrhage  Remains intubated. Developed shock overnight with decreased responsiveness. CT head no acute.  Chest CT with large RP bleed. Transfused  Wide awake on vent today. Following commands. Remains febrile   Echo 12/18 EF back to 55-60%  Now on NE at 6. Lactate 1.4 Hgb 6.8 -> 9.8   Objective:   Weight Range:  Vital Signs:   Temp:  [97.6 F (36.4 C)-99 F (37.2 C)] 98.5 F (36.9 C) (12/19 0538) Pulse Rate:  [82-103] 82 (12/19 0823) Resp:  [23-43] 43 (12/19 0741) BP: (77-137)/(45-74) 113/71 (12/19 0800) SpO2:  [90 %-100 %] 98 % (12/19 0823) Arterial Line BP: (72-199)/(33-156) 112/58 (12/19 0823) FiO2 (%):  [40 %-60 %] 40 % (12/19 0741) Weight:  [68.9 kg] 68.9 kg (12/19 0500) Last BM Date: 10/27/20  Weight change: Filed Weights   10/26/20 0730 10/27/20 0500 10/28/20 0500  Weight: 66 kg 67.9 kg 68.9 kg    Intake/Output:   Intake/Output Summary (Last 24 hours) at 10/28/2020 0844 Last data filed at 10/28/2020 0820 Gross per 24 hour  Intake 3528.04 ml  Output 1320 ml  Net 2208.04 ml     PHYSICAL EXAM: General: Awake on vent. HEENT: normal x for scerlal icterus Neck: supple. RIJ trialysis.  Cor: PMI nondisplaced. Regular rate & rhythm. No rubs, gallops or murmurs. Lungs: coarse Abdomen: soft, nontender, nondistended. No hepatosplenomegaly. No bruits or masses. Good bowel sounds. Extremities: no cyanosis, clubbing, rash, edema Neuro: awake on vent. Follows commands. Very weak.   Telemetry: Sinus 80-90 Personally reviewed  Labs: Basic Metabolic Panel: Recent Labs  Lab 10/23/20 0324 10/23/20 1415 10/24/20 0440 10/25/20 0422 10/26/20 0456 10/26/20 1602  10/27/20 0355 10/27/20 1620 10/27/20 1948 10/27/20 2018 10/27/20 2111 10/28/20 0328  NA 149*   < > 150* 146*   < > 141 139 136 136 134* 133* 133*  K 5.2*   < > 3.9 4.2   < > 4.2 4.5 5.3* 5.7* 5.9* 5.9* 6.0*  CL 114*   < > 110 107   < > 103 102 101  --  101  --  101  CO2 22   < > 22 24   < > _0 --  23  --  21*  GLUCOSE 236*   < > 288* 231*   < > 223* 134* 182*  --  273*  --  180*  BUN 103*   < > 124* 97*   < > 83* 86* 68*  --  66*  --  83*  CREATININE 3.31*   < > 3.88* 3.23*   < > 3.24* 3.03* 2.36*  --  2.35*  --  2.60*  CALCIUM 7.9*   < > 7.8* 7.7*   < > 7.5* 7.7* 7.8*  --  7.3*  --  7.2*  MG 2.8*  --  2.7* 2.5*  --   --  2.4  --   --   --   --  2.5*  PHOS 8.4*  --  8.2* 6.5*  --   --  8.2* 5.7*  --   --   --  9.2*   < > = values in this interval not displayed.  Liver Function Tests: Recent Labs  Lab 10/27/20 0355 10/27/20 1620 10/27/20 2018 10/28/20 0328  AST  --   --  324*  --   ALT  --   --  552*  --   ALKPHOS  --   --  246*  --   BILITOT  --   --  0.9  --   PROT  --   --  6.0*  --   ALBUMIN 2.1* 2.2* 2.0* 2.0*   No results for input(s): LIPASE, AMYLASE in the last 168 hours. No results for input(s): AMMONIA in the last 168 hours.  CBC: Recent Labs  Lab 10/25/20 0422 10/26/20 0456 10/27/20 0355 10/27/20 1948 10/27/20 2018 10/27/20 2111 10/28/20 0328  WBC 12.3* 10.1 9.9  --  13.0*  --  17.5*  NEUTROABS  --   --   --   --  11.2*  --   --   HGB 9.3* 8.7* 8.4* 7.8* 7.1* 6.8* 9.8*  HCT 28.5* 28.0* 26.6* 23.0* 23.5* 20.0* 29.7*  MCV 90.8 92.7 90.8  --  93.6  --  88.4  PLT 251 241 241  --  262  --  201    Cardiac Enzymes: No results for input(s): CKTOTAL, CKMB, CKMBINDEX, TROPONINI in the last 168 hours.  BNP: BNP (last 3 results) Recent Labs    10/13/20 1513  BNP 589.0*    ProBNP (last 3 results) No results for input(s): PROBNP in the last 8760 hours.    Other results:  Imaging: CT HEAD WO CONTRAST  Result Date: 10/27/2020 CLINICAL  DATA:  Encephalopathy EXAM: CT HEAD WITHOUT CONTRAST TECHNIQUE: Contiguous axial images were obtained from the base of the skull through the vertex without intravenous contrast. COMPARISON:  05/25/2018 FINDINGS: Brain: There is no mass, hemorrhage or extra-axial collection. The size and configuration of the ventricles and extra-axial CSF spaces are normal. There is hypoattenuation of the white matter, most commonly indicating chronic small vessel disease. Vascular: No abnormal hyperdensity of the major intracranial arteries or dural venous sinuses. No intracranial atherosclerosis. Skull: The visualized skull base, calvarium and extracranial soft tissues are normal. Sinuses/Orbits: No fluid levels or advanced mucosal thickening of the visualized paranasal sinuses. No mastoid or middle ear effusion. The orbits are normal. IMPRESSION: Chronic small vessel disease without acute intracranial abnormality. Electronically Signed   By: Ulyses Jarred M.D.   On: 10/27/2020 22:43   CT ANGIO CHEST PE W OR WO CONTRAST  Result Date: 10/27/2020 CLINICAL DATA:  Hypoxia, intubated, mental status change EXAM: CT ANGIOGRAPHY CHEST WITH CONTRAST TECHNIQUE: Multidetector CT imaging of the chest was performed using the standard protocol during bolus administration of intravenous contrast. Multiplanar CT image reconstructions and MIPs were obtained to evaluate the vascular anatomy. CONTRAST:  131m OMNIPAQUE IOHEXOL 350 MG/ML SOLN COMPARISON:  10/27/2020 FINDINGS: Cardiovascular: This is a technically adequate evaluation of the pulmonary vasculature. No filling defects or pulmonary emboli. Heart is unremarkable without pericardial effusion. Normal caliber of the thoracic aorta. Mediastinum/Nodes: Endotracheal tube identified, just above carina. Enteric catheter extends into the gastric lumen. Thyroid is unremarkable. No pathologic adenopathy. Lungs/Pleura: There are small bilateral pleural effusions. Patchy bilateral areas of  ground-glass airspace disease are identified. There is denser consolidation within the lateral aspect of the right lower lobe. No pneumothorax. Upper Abdomen: Large area of retroperitoneal hemorrhage is seen on the left, measuring 11.5 x 8.6 cm in transverse dimension. This is incompletely imaged on this exam due to slice selection, with the retroperitoneal hemorrhage extending inferiorly  beyond the field of view. No evidence of active contrast extravasation. Extensive atherosclerosis of the abdominal aorta. Musculoskeletal: No acute or destructive bony lesions. Reconstructed images demonstrate no additional findings. Review of the MIP images confirms the above findings. IMPRESSION: 1. Large left-sided retroperitoneal hematoma, incompletely evaluated on this study due to slice selection. 2. No evidence of pulmonary embolus. 3. Multifocal bilateral ground-glass airspace disease which may reflect atypical pneumonia. 4. Small bilateral pleural effusions. 5.  Aortic Atherosclerosis (ICD10-I70.0). Critical Value/emergent results were called by telephone at the time of interpretation on 10/27/2020 at 10:48 pm to provider [facility] DAVIS , who verbally acknowledged these results. Electronically Signed   By: Randa Ngo M.D.   On: 10/27/2020 22:49   DG CHEST PORT 1 VIEW  Result Date: 10/27/2020 CLINICAL DATA:  Hypoxia, intubated EXAM: PORTABLE CHEST 1 VIEW COMPARISON:  10/27/2020 at 5:39 p.m. FINDINGS: Single frontal view of the chest demonstrates stable endotracheal tube, enteric catheter, right internal jugular catheter, and left-sided PICC. Cardiac silhouette is unremarkable. There is patchy right basilar airspace disease unchanged. Improved aeration at the left lung base. Small right pleural effusion is suspected. No pneumothorax. IMPRESSION: 1. Persistent right basilar consolidation and small effusion. Findings could reflect pneumonia or asymmetric edema. 2. Improved aeration at the left lung base. 3. Stable  support devices. Electronically Signed   By: Randa Ngo M.D.   On: 10/27/2020 21:17   DG Chest Port 1 View  Result Date: 10/27/2020 CLINICAL DATA:  Acute on chronic respiratory failure with hypoxia EXAM: PORTABLE CHEST 1 VIEW COMPARISON:  10/25/2020 chest radiograph. FINDINGS: Endotracheal tube tip is 3.0 cm above the carina. Enteric tube enters stomach with the tip not seen on this image. Right internal jugular central venous catheter terminates in the middle third of the SVC. Left PICC terminates at the cavoatrial junction. Stable cardiomediastinal silhouette with normal heart size. No pneumothorax. No pleural effusion. Hazy patchy opacities in the right greater than left lungs, slightly improved. IMPRESSION: 1. Well-positioned support structures.  No pneumothorax. 2. Hazy patchy opacities in the right greater than left lungs, slightly improved. Electronically Signed   By: Ilona Sorrel M.D.   On: 10/27/2020 11:44   ECHOCARDIOGRAM LIMITED  Result Date: 10/27/2020    ECHOCARDIOGRAM LIMITED REPORT   Patient Name:   ORLANDA LEMMERMAN Date of Exam: 10/27/2020 Medical Rec #:  [id number]      Height:       67.0 in Accession #:    [id number]     Weight:       149.7 lb Date of Birth:  [birth year]     BSA:          1.788 m Patient Age:    25 years       BP:           146/48 mmHg Patient Gender: M              HR:           96 bpm. Exam Location:  Inpatient Procedure: Limited Echo, Color Doppler and Cardiac Doppler Indications:    C94.70 Acute systolic (congestive) heart failure  History:        Patient has prior history of Echocardiogram examinations, most                 recent 10/13/2020. CHF; Risk Factors:Hypertension and                 Dyslipidemia.  Sonographer:    [place] Referring  Phys: [id number] Emmanuelle Coxe R Keyaria Lawson IMPRESSIONS  1. Left ventricular ejection fraction, by estimation, is 55 to 60%. The left ventricle has normal function. The left ventricle has no regional wall motion abnormalities. Left  ventricular diastolic function could not be evaluated.  2. Right ventricular systolic function is normal. The right ventricular size is normal.  3. The mitral valve is normal in structure. Trivial mitral valve regurgitation.  4. The aortic valve was not assessed.  5. The inferior vena cava is normal in size with greater than 50% respiratory variability, suggesting right atrial pressure of 3 mmHg. Comparison(s): Compared to prior TTE on 10/29/2020, the LVEF appears significantly improved to 55-60%. FINDINGS  Left Ventricle: Left ventricular ejection fraction, by estimation, is 55 to 60%. The left ventricle has normal function. The left ventricle has no regional wall motion abnormalities. Left ventricular diastolic function could not be evaluated. Right Ventricle: The right ventricular size is normal. Right ventricular systolic function is normal. Left Atrium: Left atrial size was normal in size. Right Atrium: Right atrial size was normal in size. Mitral Valve: The mitral valve is normal in structure. There is mild thickening of the mitral valve leaflet(s). Trivial mitral valve regurgitation. Tricuspid Valve: The tricuspid valve is grossly normal. Aortic Valve: The aortic valve was not assessed. Pulmonic Valve: The pulmonic valve was not assessed. Venous: The inferior vena cava is normal in size with greater than 50% respiratory variability, suggesting right atrial pressure of 3 mmHg. RIGHT VENTRICLE TAPSE (M-mode): 1.6 cm Gwyndolyn Kaufman MD Electronically signed by Gwyndolyn Kaufman MD Signature Date/Time: 10/27/2020/1:43:21 PM    Final    US Abdomen Limited RUQ (LIVER/GB)  Result Date: 10/28/2020 CLINICAL DATA:  Transaminitis. EXAM: ULTRASOUND ABDOMEN LIMITED RIGHT UPPER QUADRANT COMPARISON:  None. FINDINGS: Gallbladder: No gallstones or wall thickening visualized. No sonographic Murphy sign noted by sonographer. Common bile duct: Diameter: Approximately 2 mm, although suboptimally visualized. No evidence of  intrahepatic biliary ductal dilatation. Liver: No focal lesion identified. Within normal limits in parenchymal echogenicity. Portal vein is patent on color Doppler imaging with normal direction of blood flow towards the liver. Other: None. IMPRESSION: Negative.  No hepatobiliary abnormality identified. Electronically Signed   By: Marlaine Hind M.D.   On: 10/28/2020 06:43     Medications:     Scheduled Medications: . amiodarone  200 mg Per Tube BID  . bethanechol  10 mg Per Tube TID  . chlorhexidine gluconate (MEDLINE KIT)  15 mL Mouth Rinse BID  . Chlorhexidine Gluconate Cloth  6 each Topical Daily  . clotrimazole  10 mg Oral TID  . docusate  100 mg Per Tube BID  . ezetimibe  10 mg Per Tube Daily  . feeding supplement (PROSource TF)  45 mL Per Tube BID  . free water  200 mL Per Tube Q4H  . insulin aspart  0-15 Units Subcutaneous Q4H  . insulin aspart  5 Units Subcutaneous Q4H  . insulin detemir  20 Units Subcutaneous BID  . mouth rinse  15 mL Mouth Rinse 10 times per day  . metoCLOPramide (REGLAN) injection  5 mg Intravenous Q8H  . pantoprazole sodium  40 mg Per Tube Q1200  . polyethylene glycol  17 g Per Tube Daily  . rosuvastatin  20 mg Per Tube Daily  . sodium chloride flush  10-40 mL Intracatheter Q12H  . sodium chloride flush  10-40 mL Intracatheter Q12H  . sodium chloride flush  3 mL Intravenous Q12H    Infusions: . sodium chloride Stopped (10/26/20  5956)  . sodium chloride Stopped (10/18/20 1958)  . sodium chloride    . sodium chloride    . feeding supplement (VITAL 1.5 CAL) Stopped (10/27/20 2243)  . norepinephrine (LEVOPHED) Adult infusion 6 mcg/min (10/28/20 0800)  . phenylephrine (NEO-SYNEPHRINE) Adult infusion Stopped (10/28/20 0141)  . prismasol BGK 2/2.5 dialysis solution 1,000 mL/hr at 10/28/20 0214  . prismasol BGK 2/2.5 replacement solution 400 mL/hr at 10/28/20 0214  . prismasol BGK 2/2.5 replacement solution      PRN Medications: Place/Maintain arterial  line **AND** sodium chloride, sodium chloride, sodium chloride, sodium chloride, acetaminophen (TYLENOL) oral liquid 160 mg/5 mL, alteplase, fentaNYL (SUBLIMAZE) injection, fentaNYL (SUBLIMAZE) injection, heparin, heparin, heparin, heparin, ipratropium-albuterol, ondansetron **OR** ondansetron (ZOFRAN) IV, sodium chloride flush, sodium chloride flush   Assessment/Plan:   1. Acute Hypoxemic Respiratory Failure - due to PNA and HF - Developed acute respiratory failure on 12/4 and required intubation.  -  PNA much improved. PCT 109 -> 84->43 -> 18 - Extubated 12/8 but required re intubation.  - Sputum cultures w/ normal respiratory flora.  - Completed vanc/meropenem - We had been working toward extubation but now had setback with RP bleed. May need trach this week  2. Shock - Initially sepsis/cardiogenic combined. Now with component of hemorrhagic shock in setting of RP bleed - Echo 12/4 EF with EF 25-30% and WMA. RV normal.   - Echo 12/18 EF 55-60% - Completed vanc/mero on 12/15 - Cx all negative - Back on NE. Wean as tolerated - Repeating cx today  3. Acute Systolic Heart Failure  - As above Echo 12/4 EF 25-30% . No previous ECHO. No previous cath.  - Hs Trop [phone]. - Echo 12/18 EF 55-60%.  - RHC 12/6 with low filling pressures after diuresis and low index 1.9. SVR 1467 - Unable to add GDMT due to shock/ARF. - Management as above - CVP 7 - Suspect combination of septic and cardiogenic shock. Troponin moderately elevated but flat. Possibility of Tako-tsubo/septic CM has been raised and seems to fit clinically but cannot rule out CAD.  If he recovers and creatinine comes down, cosndier coronary angiography.   4. Large RP bleed with acute blood loss anemia - developed on 12/18 - has been transfused hgb 9.8 this am   5. PAF - in NSR on po amio - off heparin with RP bleed  6. Back Pain--> S/P L5-S1 laminectomy with fusion 09/28/2020  Per Neurosugery  7. AKI - Started  HD on 12/15. Plan as above  8. Ileus - Improved, has had BM though still distended.    9. Hypernatremia - Resolved  10. Debility - suspect ICU myopathy    CRITICAL CARE Performed by: Glori Bickers  Total critical care time: 45 minutes  Critical care time was exclusive of separately billable procedures and treating other patients.  Critical care was necessary to treat or prevent imminent or life-threatening deterioration.  Critical care was time spent personally by me (independent of midlevel providers or residents) on the following activities: development of treatment plan with patient and/or surrogate as well as nursing, discussions with consultants, evaluation of patient's response to treatment, examination of patient, obtaining history from patient or surrogate, ordering and performing treatments and interventions, ordering and review of laboratory studies, ordering and review of radiographic studies, pulse oximetry and re-evaluation of patient's condition.    Length of Stay: 48  Glori Bickers, MD  8:44 AM

## 2020-10-28 NOTE — Progress Notes (Signed)
Patient ID: Dennis Carey, male   DOB: 13-Sep-1935, 85 y.o.   MRN: [id number] [facility] Progress Note   Assessment/ Plan:   1.  Acute kidney injury on chronic kidney disease stage III: Likely hemodynamically mediated with CHF exacerbation/ongoing diuresis with evolution to ATN.  Oliguric overnight with renal injury likely exacerbated by hemorrhagic shock.  Continue on CRRT at this time for management of severe azotemia and developing hyperkalemia.  He remains on pressor (Levophed). 2.  Hyperkalemia: This appears to be likely secondary to PRBC transfusion and resorption of hematoma in the setting of acute kidney injury.  CRRT prescription modified to lower potassium dialysate/fluids. 3.  Anemia: With acute blood loss anemia/hemorrhagic shock following large left retroperitoneal hematoma development.  He is status post PRBC transfusion and no longer getting heparin for CRRT circuit anticoagulation. 4.  Encephalopathy: Suspected to be multifactorial but primarily from severe azotemia-overnight exacerbated by hemorrhagic shock; continue CRRT 5.  Acute hypoxic respiratory failure: Suspected multifactorial from HCAP and acute pulmonary edema from CHF exacerbation.  Appears to be euvolemic to slightly hypervolemic on exam, continue to maintain him net even on CRRT. 6.  Hemorrhagic shock: On Levophed, weaning down as hemodynamic status improved status post PRBC transfusion.  Subjective:   Decompensated yesterday evening with decreased level of consciousness after earlier appearing to make progress.  Shock secondary to large left retroperitoneal hematoma; now status post PRBC transfusion.   Objective:   BP 113/71   Pulse 82   Temp 98.5 F (36.9 C) (Axillary)   Resp (!) 43   Ht '5\' 7"'  (1.702 m)   Wt 68.9 kg   SpO2 98%   BMI 23.79 kg/m   Intake/Output Summary (Last 24 hours) at 10/28/2020 0846 Last data filed at 10/28/2020 0820 Gross per 24 hour  Intake 3528.04 ml  Output 1320 ml   Net 2208.04 ml   Weight change: 2.9 kg  Physical Exam: Gen: Appears comfortable, resting on ventilator.  Looking around and turns eyes towards calling out his name.  Wife at his side. CVS: Pulse regular rhythm, normal rate, S1 and S2 normal without any murmurs, rub or gallop Resp: Anteriorly clear to auscultation, no distinct rales or rhonchi Abd: Soft, flat, nontender Ext: 1+ upper extremity edema, trace dependent edema.  No lower extremity edema.  Imaging: CT HEAD WO CONTRAST  Result Date: 10/27/2020 CLINICAL DATA:  Encephalopathy EXAM: CT HEAD WITHOUT CONTRAST TECHNIQUE: Contiguous axial images were obtained from the base of the skull through the vertex without intravenous contrast. COMPARISON:  05/25/2018 FINDINGS: Brain: There is no mass, hemorrhage or extra-axial collection. The size and configuration of the ventricles and extra-axial CSF spaces are normal. There is hypoattenuation of the white matter, most commonly indicating chronic small vessel disease. Vascular: No abnormal hyperdensity of the major intracranial arteries or dural venous sinuses. No intracranial atherosclerosis. Skull: The visualized skull base, calvarium and extracranial soft tissues are normal. Sinuses/Orbits: No fluid levels or advanced mucosal thickening of the visualized paranasal sinuses. No mastoid or middle ear effusion. The orbits are normal. IMPRESSION: Chronic small vessel disease without acute intracranial abnormality. Electronically Signed   By: Ulyses Jarred M.D.   On: 10/27/2020 22:43   CT ANGIO CHEST PE W OR WO CONTRAST  Result Date: 10/27/2020 CLINICAL DATA:  Hypoxia, intubated, mental status change EXAM: CT ANGIOGRAPHY CHEST WITH CONTRAST TECHNIQUE: Multidetector CT imaging of the chest was performed using the standard protocol during bolus administration of intravenous contrast. Multiplanar CT image reconstructions and MIPs were  obtained to evaluate the vascular anatomy. CONTRAST:  161m OMNIPAQUE  IOHEXOL 350 MG/ML SOLN COMPARISON:  10/27/2020 FINDINGS: Cardiovascular: This is a technically adequate evaluation of the pulmonary vasculature. No filling defects or pulmonary emboli. Heart is unremarkable without pericardial effusion. Normal caliber of the thoracic aorta. Mediastinum/Nodes: Endotracheal tube identified, just above carina. Enteric catheter extends into the gastric lumen. Thyroid is unremarkable. No pathologic adenopathy. Lungs/Pleura: There are small bilateral pleural effusions. Patchy bilateral areas of ground-glass airspace disease are identified. There is denser consolidation within the lateral aspect of the right lower lobe. No pneumothorax. Upper Abdomen: Large area of retroperitoneal hemorrhage is seen on the left, measuring 11.5 x 8.6 cm in transverse dimension. This is incompletely imaged on this exam due to slice selection, with the retroperitoneal hemorrhage extending inferiorly beyond the field of view. No evidence of active contrast extravasation. Extensive atherosclerosis of the abdominal aorta. Musculoskeletal: No acute or destructive bony lesions. Reconstructed images demonstrate no additional findings. Review of the MIP images confirms the above findings. IMPRESSION: 1. Large left-sided retroperitoneal hematoma, incompletely evaluated on this study due to slice selection. 2. No evidence of pulmonary embolus. 3. Multifocal bilateral ground-glass airspace disease which may reflect atypical pneumonia. 4. Small bilateral pleural effusions. 5.  Aortic Atherosclerosis (ICD10-I70.0). Critical Value/emergent results were called by telephone at the time of interpretation on 10/27/2020 at 10:48 pm to provider [facility] , who verbally acknowledged these results. Electronically Signed   By: MRanda NgoM.D.   On: 10/27/2020 22:49   DG CHEST PORT 1 VIEW  Result Date: 10/27/2020 CLINICAL DATA:  Hypoxia, intubated EXAM: PORTABLE CHEST 1 VIEW COMPARISON:  10/27/2020 at 5:39 p.m.  FINDINGS: Single frontal view of the chest demonstrates stable endotracheal tube, enteric catheter, right internal jugular catheter, and left-sided PICC. Cardiac silhouette is unremarkable. There is patchy right basilar airspace disease unchanged. Improved aeration at the left lung base. Small right pleural effusion is suspected. No pneumothorax. IMPRESSION: 1. Persistent right basilar consolidation and small effusion. Findings could reflect pneumonia or asymmetric edema. 2. Improved aeration at the left lung base. 3. Stable support devices. Electronically Signed   By: MRanda NgoM.D.   On: 10/27/2020 21:17   DG Chest Port 1 View  Result Date: 10/27/2020 CLINICAL DATA:  Acute on chronic respiratory failure with hypoxia EXAM: PORTABLE CHEST 1 VIEW COMPARISON:  10/25/2020 chest radiograph. FINDINGS: Endotracheal tube tip is 3.0 cm above the carina. Enteric tube enters stomach with the tip not seen on this image. Right internal jugular central venous catheter terminates in the middle third of the SVC. Left PICC terminates at the cavoatrial junction. Stable cardiomediastinal silhouette with normal heart size. No pneumothorax. No pleural effusion. Hazy patchy opacities in the right greater than left lungs, slightly improved. IMPRESSION: 1. Well-positioned support structures.  No pneumothorax. 2. Hazy patchy opacities in the right greater than left lungs, slightly improved. Electronically Signed   By: JIlona SorrelM.D.   On: 10/27/2020 11:44   ECHOCARDIOGRAM LIMITED  Result Date: 10/27/2020    ECHOCARDIOGRAM LIMITED REPORT   Patient Name:   TBOYDE GRIECODate of Exam: 10/27/2020 Medical Rec #:  [id number]     Height:       67.0 in Accession #:    [id number]    Weight:       149.7 lb Date of Birth:  1July 13, 1936    BSA:          1.788 m Patient Age:    89  years       BP:           146/48 mmHg Patient Gender: M              HR:           96 bpm. Exam Location:  Inpatient Procedure: Limited Echo, Color  Doppler and Cardiac Doppler Indications:    H20.94 Acute systolic (congestive) heart failure  History:        Patient has prior history of Echocardiogram examinations, most                 recent 10/13/2020. CHF; Risk Factors:Hypertension and                 Dyslipidemia.  Sonographer:    Raquel Sarna Senior RDCS Referring Phys: [id number] DANIEL R BENSIMHON IMPRESSIONS  1. Left ventricular ejection fraction, by estimation, is 55 to 60%. The left ventricle has normal function. The left ventricle has no regional wall motion abnormalities. Left ventricular diastolic function could not be evaluated.  2. Right ventricular systolic function is normal. The right ventricular size is normal.  3. The mitral valve is normal in structure. Trivial mitral valve regurgitation.  4. The aortic valve was not assessed.  5. The inferior vena cava is normal in size with greater than 50% respiratory variability, suggesting right atrial pressure of 3 mmHg. Comparison(s): Compared to prior TTE on 10/20/2020, the LVEF appears significantly improved to 55-60%. FINDINGS  Left Ventricle: Left ventricular ejection fraction, by estimation, is 55 to 60%. The left ventricle has normal function. The left ventricle has no regional wall motion abnormalities. Left ventricular diastolic function could not be evaluated. Right Ventricle: The right ventricular size is normal. Right ventricular systolic function is normal. Left Atrium: Left atrial size was normal in size. Right Atrium: Right atrial size was normal in size. Mitral Valve: The mitral valve is normal in structure. There is mild thickening of the mitral valve leaflet(s). Trivial mitral valve regurgitation. Tricuspid Valve: The tricuspid valve is grossly normal. Aortic Valve: The aortic valve was not assessed. Pulmonic Valve: The pulmonic valve was not assessed. Venous: The inferior vena cava is normal in size with greater than 50% respiratory variability, suggesting right atrial pressure of 3 mmHg. RIGHT  VENTRICLE TAPSE (M-mode): 1.6 cm Gwyndolyn Kaufman MD Electronically signed by Gwyndolyn Kaufman MD Signature Date/Time: 10/27/2020/1:43:21 PM    Final    US Abdomen Limited RUQ (LIVER/GB)  Result Date: 10/28/2020 CLINICAL DATA:  Transaminitis. EXAM: ULTRASOUND ABDOMEN LIMITED RIGHT UPPER QUADRANT COMPARISON:  None. FINDINGS: Gallbladder: No gallstones or wall thickening visualized. No sonographic Murphy sign noted by sonographer. Common bile duct: Diameter: Approximately 2 mm, although suboptimally visualized. No evidence of intrahepatic biliary ductal dilatation. Liver: No focal lesion identified. Within normal limits in parenchymal echogenicity. Portal vein is patent on color Doppler imaging with normal direction of blood flow towards the liver. Other: None. IMPRESSION: Negative.  No hepatobiliary abnormality identified. Electronically Signed   By: Marlaine Hind M.D.   On: 10/28/2020 06:43    Labs: BMET Recent Labs  Lab 10/22/20 7096 10/23/20 0324 10/23/20 1415 10/24/20 0440 10/25/20 0422 10/26/20 0456 10/26/20 1602 10/27/20 0355 10/27/20 1620 10/27/20 1948 10/27/20 2018 10/27/20 2111 10/28/20 0328  NA 148* 149*   < > 150* 146* 147* 141 139 136 136 134* 133* 133*  K 4.4 5.2*   < > 3.9 4.2 4.9 4.2 4.5 5.3* 5.7* 5.9* 5.9* 6.0*  CL 113* 114*   < > 110  107 109 103 102 101  --  101  --  101  CO2 20* 22   < > 22 24 20* '24 22 25  ' --  23  --  21*  GLUCOSE 212* 236*   < > 288* 231* 232* 223* 134* 182*  --  273*  --  180*  BUN 84* 103*   < > 124* 97* 139* 83* 86* 68*  --  66*  --  83*  CREATININE 2.75* 3.31*   < > 3.88* 3.23* 4.71* 3.24* 3.03* 2.36*  --  2.35*  --  2.60*  CALCIUM 7.5* 7.9*   < > 7.8* 7.7* 7.7* 7.5* 7.7* 7.8*  --  7.3*  --  7.2*  PHOS 6.4* 8.4*  --  8.2* 6.5*  --   --  8.2* 5.7*  --   --   --  9.2*   < > = values in this interval not displayed.   CBC Recent Labs  Lab 10/26/20 0456 10/27/20 0355 10/27/20 1948 10/27/20 2018 10/27/20 2111 10/28/20 0328  WBC 10.1 9.9   --  13.0*  --  17.5*  NEUTROABS  --   --   --  11.2*  --   --   HGB 8.7* 8.4* 7.8* 7.1* 6.8* 9.8*  HCT 28.0* 26.6* 23.0* 23.5* 20.0* 29.7*  MCV 92.7 90.8  --  93.6  --  88.4  PLT 241 241  --  262  --  201    Medications:    . amiodarone  200 mg Per Tube BID  . bethanechol  10 mg Per Tube TID  . chlorhexidine gluconate (MEDLINE KIT)  15 mL Mouth Rinse BID  . Chlorhexidine Gluconate Cloth  6 each Topical Daily  . clotrimazole  10 mg Oral TID  . docusate  100 mg Per Tube BID  . ezetimibe  10 mg Per Tube Daily  . feeding supplement (PROSource TF)  45 mL Per Tube BID  . free water  200 mL Per Tube Q4H  . insulin aspart  0-15 Units Subcutaneous Q4H  . insulin aspart  5 Units Subcutaneous Q4H  . insulin detemir  20 Units Subcutaneous BID  . mouth rinse  15 mL Mouth Rinse 10 times per day  . metoCLOPramide (REGLAN) injection  5 mg Intravenous Q8H  . pantoprazole sodium  40 mg Per Tube Q1200  . polyethylene glycol  17 g Per Tube Daily  . rosuvastatin  20 mg Per Tube Daily  . sodium chloride flush  10-40 mL Intracatheter Q12H  . sodium chloride flush  10-40 mL Intracatheter Q12H  . sodium chloride flush  3 mL Intravenous Q12H   Elmarie Shiley, MD 10/28/2020, 8:46 AM

## 2020-10-28 NOTE — Progress Notes (Addendum)
CCM called to be notified of patients neuro status change. Patient unable to follow commands, Pupils unequal and sluggish with L pupil = 71mm and R pupil = 3 mm, breathing on vent labored and shallow with use of accessory muscles. verbal orders given for ABG, CT scan, 500 ml LR bolus, and vasopressors started for hypotension. Son at bedside updated. Dr. Trenton Gammon (Neuro[facility]) and CCM ground team came to evaluate patient at bedside. Critical labs called in to CCM, and nephrology. New orders for CRRT prisma bags given for Pre-filter and Dialysate 2K/2.5Ca and Post-filter bag 4K/2.5 Ca per Dr. Jonnie Finner due to elevated K+. Systemic heparin discontinued.

## 2020-10-28 NOTE — Progress Notes (Signed)
PT Cancellation Note  Patient Details Name: Dennis Carey MRN: [id number] DOB: 1935/07/14   Cancelled Treatment:    Reason Eval/Treat Not Completed: Patient not medically ready (Discussed with RN; pt with recent large RP bleed and not currently appropriate. Will follow).  Wyona Almas, PT, DPT Acute Rehabilitation Services Pager [phone] Office [phone]    Deno Etienne 10/28/2020, 10:51 AM

## 2020-10-28 NOTE — Plan of Care (Signed)
  Problem: Education: Goal: Knowledge of General Education information will improve Description: Including pain rating scale, medication(s)/side effects and non-pharmacologic comfort measures Outcome: Progressing   Problem: Health Behavior/Discharge Planning: Goal: Ability to manage health-related needs will improve Outcome: Progressing   Problem: Clinical Measurements: Goal: Ability to maintain clinical measurements within normal limits will improve Outcome: Progressing Goal: Will remain free from infection Outcome: Progressing Goal: Diagnostic test results will improve Outcome: Progressing Goal: Respiratory complications will improve Outcome: Progressing Goal: Cardiovascular complication will be avoided Outcome: Progressing   Problem: Activity: Goal: Risk for activity intolerance will decrease Outcome: Progressing   Problem: Nutrition: Goal: Adequate nutrition will be maintained Outcome: Progressing   Problem: Coping: Goal: Level of anxiety will decrease Outcome: Progressing   Problem: Elimination: Goal: Will not experience complications related to bowel motility Outcome: Progressing Goal: Will not experience complications related to urinary retention Outcome: Progressing   Problem: Pain Managment: Goal: General experience of comfort will improve Outcome: Progressing   Problem: Safety: Goal: Ability to remain free from injury will improve Outcome: Progressing   Problem: Skin Integrity: Goal: Risk for impaired skin integrity will decrease Outcome: Progressing   Problem: Activity: Goal: Ability to tolerate increased activity will improve Outcome: Progressing   Problem: Respiratory: Goal: Ability to maintain a clear airway and adequate ventilation will improve Outcome: Progressing   Problem: Role Relationship: Goal: Method of communication will improve Outcome: Progressing   Problem: Education: Goal: Understanding of CV disease, CV risk reduction, and  recovery process will improve Outcome: Progressing Goal: Individualized Educational Video(s) Outcome: Progressing   Problem: Activity: Goal: Ability to return to baseline activity level will improve Outcome: Progressing   Problem: Cardiovascular: Goal: Ability to achieve and maintain adequate cardiovascular perfusion will improve Outcome: Progressing Goal: Vascular access site(s) Level 0-1 will be maintained Outcome: Progressing   Problem: Health Behavior/Discharge Planning: Goal: Ability to safely manage health-related needs after discharge will improve Outcome: Progressing   

## 2020-10-29 ENCOUNTER — Inpatient Hospital Stay (HOSPITAL_COMMUNITY): Payer: Medicare Other

## 2020-10-29 LAB — CBC
HCT: 36.9 % — ABNORMAL LOW (ref 39.0–52.0)
Hemoglobin: 12.8 g/dL — ABNORMAL LOW (ref 13.0–17.0)
MCH: 29.7 pg (ref 26.0–34.0)
MCHC: 34.7 g/dL (ref 30.0–36.0)
MCV: 85.6 fL (ref 80.0–100.0)
Platelets: 164 10*3/uL (ref 150–400)
RBC: 4.31 MIL/uL (ref 4.22–5.81)
RDW: 14.2 % (ref 11.5–15.5)
WBC: 22.5 10*3/uL — ABNORMAL HIGH (ref 4.0–10.5)
nRBC: 0.2 % (ref 0.0–0.2)

## 2020-10-29 LAB — RENAL FUNCTION PANEL
Albumin: 2 g/dL — ABNORMAL LOW (ref 3.5–5.0)
Albumin: 1.9 g/dL — ABNORMAL LOW (ref 3.5–5.0)
Anion gap: 12 (ref 5–15)
Anion gap: 12 (ref 5–15)
BUN: 65 mg/dL — ABNORMAL HIGH (ref 8–23)
BUN: 60 mg/dL — ABNORMAL HIGH (ref 8–23)
CO2: 20 mmol/L — ABNORMAL LOW (ref 22–32)
CO2: 21 mmol/L — ABNORMAL LOW (ref 22–32)
Calcium: 7.1 mg/dL — ABNORMAL LOW (ref 8.9–10.3)
Calcium: 7 mg/dL — ABNORMAL LOW (ref 8.9–10.3)
Chloride: 102 mmol/L (ref 98–111)
Chloride: 100 mmol/L (ref 98–111)
Creatinine, Ser: 1.94 mg/dL — ABNORMAL HIGH (ref 0.61–1.24)
Creatinine, Ser: 2.16 mg/dL — ABNORMAL HIGH (ref 0.61–1.24)
GFR, Estimated: 34 mL/min — ABNORMAL LOW (ref >60)
GFR, Estimated: 30 mL/min — ABNORMAL LOW (ref >60)
Glucose, Bld: 206 mg/dL — ABNORMAL HIGH (ref 70–99)
Glucose, Bld: 99 mg/dL (ref 70–99)
Phosphorus: 5.5 mg/dL — ABNORMAL HIGH (ref 2.5–4.6)
Phosphorus: 4.9 mg/dL — ABNORMAL HIGH (ref 2.5–4.6)
Potassium: 4.8 mmol/L (ref 3.5–5.1)
Potassium: 4.3 mmol/L (ref 3.5–5.1)
Sodium: 134 mmol/L — ABNORMAL LOW (ref 135–145)
Sodium: 133 mmol/L — ABNORMAL LOW (ref 135–145)

## 2020-10-29 LAB — POCT I-STAT 7, (LYTES, BLD GAS, ICA,H+H)
Acid-Base Excess: 0 mmol/L (ref 0.0–2.0)
Bicarbonate: 22.9 mmol/L (ref 20.0–28.0)
Calcium, Ion: 1.02 mmol/L — ABNORMAL LOW (ref 1.15–1.40)
HCT: 36 % — ABNORMAL LOW (ref 39.0–52.0)
Hemoglobin: 12.2 g/dL — ABNORMAL LOW (ref 13.0–17.0)
O2 Saturation: 99 %
Patient temperature: 98.7
Potassium: 4.7 mmol/L (ref 3.5–5.1)
Sodium: 136 mmol/L (ref 135–145)
TCO2: 24 mmol/L (ref 22–32)
pCO2 arterial: 32.7 mmHg (ref 32.0–48.0)
pH, Arterial: 7.452 — ABNORMAL HIGH (ref 7.350–7.450)
pO2, Arterial: 142 mmHg — ABNORMAL HIGH (ref 83.0–108.0)

## 2020-10-29 LAB — GLUCOSE, CAPILLARY
Glucose-Capillary: 149 mg/dL — ABNORMAL HIGH (ref 70–99)
Glucose-Capillary: 156 mg/dL — ABNORMAL HIGH (ref 70–99)
Glucose-Capillary: 162 mg/dL — ABNORMAL HIGH (ref 70–99)
Glucose-Capillary: 195 mg/dL — ABNORMAL HIGH (ref 70–99)
Glucose-Capillary: 243 mg/dL — ABNORMAL HIGH (ref 70–99)
Glucose-Capillary: 56 mg/dL — ABNORMAL LOW (ref 70–99)
Glucose-Capillary: 67 mg/dL — ABNORMAL LOW (ref 70–99)
Glucose-Capillary: 72 mg/dL (ref 70–99)
Glucose-Capillary: 85 mg/dL (ref 70–99)
Glucose-Capillary: 85 mg/dL (ref 70–99)

## 2020-10-29 LAB — PROCALCITONIN: Procalcitonin: 7.98 ng/mL

## 2020-10-29 LAB — LACTIC ACID, PLASMA
Lactic Acid, Venous: 1.4 mmol/L (ref 0.5–1.9)
Lactic Acid, Venous: 1.6 mmol/L (ref 0.5–1.9)

## 2020-10-29 LAB — MAGNESIUM: Magnesium: 2.8 mg/dL — ABNORMAL HIGH (ref 1.7–2.4)

## 2020-10-29 LAB — APTT: aPTT: 30 seconds (ref 24–36)

## 2020-10-29 MED ORDER — DEXTROSE 50 % IV SOLN
INTRAVENOUS | Status: AC
  Filled 2020-10-29: qty 50

## 2020-10-29 MED ORDER — DEXTROSE 5 % IV SOLN: INTRAVENOUS | Status: DC

## 2020-10-29 MED ORDER — DEXTROSE 50 % IV SOLN
50.0000 mL | Freq: Once | INTRAVENOUS | Status: AC
  Administered 2020-10-29: 12:00:00 50 mL via INTRAVENOUS

## 2020-10-29 MED ORDER — DEXMEDETOMIDINE HCL IN NACL 400 MCG/100ML IV SOLN
0.4000 ug/kg/h | INTRAVENOUS | Status: DC
  Administered 2020-10-29: 0.4 ug/kg/h via INTRAVENOUS
  Administered 2020-10-30: 04:00:00 0.8 ug/kg/h via INTRAVENOUS
  Administered 2020-10-31: 08:00:00 0.4 ug/kg/h via INTRAVENOUS
  Administered 2020-10-31: 0.8 ug/kg/h via INTRAVENOUS
  Filled 2020-10-29 (×4): qty 100

## 2020-10-29 MED ORDER — DEXTROSE 10 % IV SOLN: INTRAVENOUS | Status: DC

## 2020-10-29 MED ORDER — DEXTROSE 50 % IV SOLN
12.5000 g | Freq: Once | INTRAVENOUS | Status: AC
  Administered 2020-10-29: 21:00:00 12.5 g via INTRAVENOUS
  Filled 2020-10-29: qty 50

## 2020-10-29 NOTE — Progress Notes (Signed)
Remains hypotensive requiring Levophed.  Remains on continuous dialysis.  Mental status has again improved.  Patient awake and aware.  He will follow some simple commands.  Patient with abdominal distention.  Does not appear to be depressed.  Significantly tender.  Tube feeds have been stopped.    Patient appears to have been adequately resuscitated with regard to his shock from his retroperitoneal hemorrhage.  Why his hypotension persists is unclear.  Certainly I worry about his abdominal distention with regard to possible abdominal compartment syndrome and subsequent bowel injury.  I will defer this management to CCM.

## 2020-10-29 NOTE — Progress Notes (Signed)
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  12/8 Extubated but required re-intubation.  12/10 Reintubated for hypoxia this morning, increased work of breathing.   Broad-spectrum antibiotics continued.   12/15 started HD 12/17 switched to CVVHD 12/18 Developed shock workup revealed Large RP hemorrhage  Consults:  Neurosurgery Cardiology  Procedures:  10/13/20 Intubation 12/5 bronch 12/15 > R IJ HD cath  Significant Diagnostic Tests:  10/11/20 Chest X-ray, minimal left basilar atelectasis 12/3 LE venous doppler: neg 12/4 echo: LVEF 25-30% 12/10 KUB: Consistent with colonic ileus-gas present down to rectum 12/13 Renal US > mild perinephric fluid, no obstruction. 12/18 head CT >> no acute change CT-PA 12/18 >> no PE, bilateral effusions with associated basilar groundglass/atelectasis, new left retroperitoneal hemorrhage  Micro Data:  10/26/2020 COVID, Flu neg 12/4:  MRSA PCR neg 12/4 Bronch tracheal aspirate -normal flora 12/4 blood: Negative  Antimicrobials:  Vanc 12/4 > 12/13 Cefepime 12/4 > 12/10 Doxycycline 12/6 > 2/9 Merrem 12/10 > 12/16  Interim history/subjective:  WBC uptrending but pressor requirement is improving  He is alert and slightly interactive on vent  Daughter at bedside and all questions answered   Objective   Blood pressure (!) 117/55, pulse 94, temperature 99.3 F (37.4 C), temperature source Axillary, resp. rate (!) 25, height 5\' 7"  (1.702 m), weight 69.9 kg, SpO2 97 %. CVP:  [0 mmHg-8 mmHg] 3 mmHg  Vent  Mode: PRVC FiO2 (%):  [40 %] 40 % Set Rate:  [28 bmp-35 bmp] 28 bmp Vt Set:  [520 mL] 520 mL PEEP:  [5 cmH20-8 cmH20] 5 cmH20 Plateau Pressure:  [9 cmH20-24 cmH20] 9 cmH20   Intake/Output Summary (Last 24 hours) at 10/29/2020 0630 Last data filed at 10/29/2020 1601 Gross per 24 hour  Intake 5428.41 ml  Output 1146 ml  Net 4282.41 ml   Filed Weights   10/27/20 0500 10/28/20 0500 10/29/20 0500  Weight: 67.9 kg 68.9 kg 69.9 kg   Physical Exam: General: Chronically ill appearing very deconditioned elderly male lying in bed in no acute distress  HEENT: ETT, MM pink/moist, PERRL,  Neuro: Eyes open and appears to understand communication unable to follow commands, appears very weak CV: s1s2 regular rate and rhythm, no murmur, rubs, or gallops,  PULM: Faint coarseness posteriorly, tolerating ventilator well, no acute distress GI: Taunt, bowel sounds hypoactive in all 4 quadrants, tender to palpation, right distended distended, tube feeds placed on hold Extremities: warm/dry, no edema  Skin: no rashes or lesions   Resolved problems:  Hypernatremia Hyperkalemia Adynamic ileus A.flutter with RVR  Assessment & Plan:   L5-S1 DJD s/p laminectomy/decompression P: Postop care per neurosurgery  Supportive care   Acute hypoxic respiratory failure due to acute pulm edema and likely aspiration pneumonitis vs HCAP (s/p 10 days vanc and 7 days merrem). P: Continue ventilator support with lung protective strategies  Continue to correct underlying encephalopathy, this remains main barrier to extubation  Wean PEEP and FiO2 for sats greater than 90%. Head of bed elevated 30 degrees. Plateau pressures less than 30 cm H20.  Follow  intermittent chest x-ray and ABG.   SAT/SBT as tolerated, mentation preclude extubation  Ensure adequate pulmonary hygiene  Follow cultures  VAP bundle in place  PAD protocol Given prolonged clinical course and profound deconditioning patient is likely going to  need prolonged ventilator support.  This was discussed with daughter at bedside 12/20.  Family will likely need to determine desire for tracheostomy in the near future  Continued shock -Initially presented with signs of mentation of septic and cardiogenic shock however now there is also a component of hemorrhagic shock given spontaneous retroperitoneal bleed Acute left retroperitoneal bleed -Developed hemorrhagic shock overnight of 12/18 P: Supportive care Echo as above Status post prolonged broad-spectrum antibiotic regiment Remains on vasopressors, down titrating Follow culture data currently remains negative Follow CBC Transfuse per protocol Hemoglobin goal greater than 8  Acute metabolic encephalopathy, acutely worse 12/18 in the setting of hypotension.  Repeat CT head reassuring P: Minimize sedation as able  Correct underlying metabolic issues  Close monitoring of hemodynamics to ensure adequate perfusion   AKI due to ATN -Obstruction was ruled out by negative venous ultrasound P: Nephrology consulted, assistance Remains on CRRT since 12/17 currently running fluid positive Follow urine output, discontinue Foley catheter today in favor of condom cath Follow BMP Renal dose medications  Acute systolic congestive heart failure -Right heart cath 12/6 with low filling pressures P: Heart failure team following, appreciate assistance Echo as above Goal-directed medical therapy once able Consider coronary angiograph once medically stabilized  Type 2 diabetes, well controlled -Blood sugar was well controlled on sliding scale insulin, Levemir, and to be coverage however tube feeds have been stopped due to questionable developing ileus has now mildly hypoglycemic P: Long-acting insulin Hold to be coverage Begin dose dextrose infusion CBGs every 4 hours  Anemia of acute illness and now due to retroperitoneal bleed 12/18, P: Follow CBC as above Transfusion protocol as  above  Generalized deconditioning. P: PT/OT efforts as able  Ileus -Review it appears patient has been intermittently experiencing an ileus.  This was initially treated with bowel rest and Reglan and had resolved.  Most recent bowel movement reported 12/19 P: Morning of 12/20 abdomen is quite distended with significantly hypoactive bowel sounds Tube feeds placed on hold Ileus likely developing in the setting of retroperitoneal bleed Place OG to wall suction  Best practice (evaluated daily)  Diet: TF Pain/Anxiety/Delirium protocol (if indicated): Hold all sedation. VAP protocol (if indicated): ordered DVT prophylaxis: Subcu heparin GI prophylaxis: PPI Glucose control: SSI, levemir Mobility: BR Code Status: Full Communication: Daughter updated at bedside 12/20 Disposition: ICU  Goals of Care:  Last date of multidisciplinary goals of care discussion: Individual family members have been updated on bedside rounding daily.  Formal goals of care discussion pending.  Will discuss with family today regarding possibility of meeting for formal goals of care discussion Family and staff present: Pending Summary of discussion: Pending Follow up goals of care discussion due: 12/21 Code Status: Full  Labs   CBC: Recent Labs  Lab 10/27/20 2018 10/27/20 2111 10/28/20 0328 10/28/20 1004 10/28/20 1708 10/28/20 2007 10/29/20 0418  WBC 13.0*  --  17.5* 17.2* 18.8* 19.9* 22.5*  NEUTROABS 11.2*  --   --   --   --   --   --   HGB 7.1*   < > 9.8* 10.5* 9.5* 9.3* 12.8*  12.2*  HCT 23.5*   < > 29.7* 31.0* 27.7* 27.2* 36.9*  36.0*  MCV 93.6  --  88.4 85.6  85.5 85.5 85.6  PLT 262  --  201 173 162 170 164   < > = values in this interval not displayed.    Basic Metabolic Panel: Recent Labs  Lab 10/24/20 0440 10/25/20 0422 10/26/20 0456 10/27/20 0355 10/27/20 1620 10/27/20 1948 10/27/20 2018 10/27/20 2111 10/28/20 0328 10/28/20 1404 10/29/20 0418  NA 150* 146*   < > 139 136   < >  134* 133* 133* 135 134*  136  K 3.9 4.2   < > 4.5 5.3*   < > 5.9* 5.9* 6.0* 5.1 4.8  4.7  CL 110 107   < > 102 101  --  101  --  101 103 102  CO2 22 24   < > 22 25  --  23  --  21* 20* 20*  GLUCOSE 288* 231*   < > 134* 182*  --  273*  --  180* 176* 206*  BUN 124* 97*   < > 86* 68*  --  66*  --  83* 69* 65*  CREATININE 3.88* 3.23*   < > 3.03* 2.36*  --  2.35*  --  2.60* 2.08* 1.94*  CALCIUM 7.8* 7.7*   < > 7.7* 7.8*  --  7.3*  --  7.2* 7.2* 7.1*  MG 2.7* 2.5*  --  2.4  --   --   --   --  2.5*  --  2.8*  PHOS 8.2* 6.5*  --  8.2* 5.7*  --   --   --  9.2* 6.8* 5.5*   < > = values in this interval not displayed.   GFR: Estimated Creatinine Clearance: 27 mL/min (A) (by C-G formula based on SCr of 1.94 mg/dL (H)). Recent Labs  Lab 10/22/20 1007 10/23/20 0324 10/27/20 2018 10/27/20 2320 10/28/20 0328 10/28/20 1004 10/28/20 1708 10/28/20 2007 10/29/20 0418  PROCALCITON 18.21  --   --   --   --   --   --   --   --   WBC  --    < > 13.0*  --  17.5* 17.2* 18.8* 19.9* 22.5*  LATICACIDVEN  --   --  2.0* 1.5 1.4  --   --   --   --    < > = values in this interval not displayed.    Liver Function Tests: Recent Labs  Lab 10/27/20 2018 10/28/20 0328 10/28/20 1152 10/28/20 1404 10/29/20 0418  AST 324*  --  203*  --   --   ALT 552*  --  388*  --   --   ALKPHOS 246*  --  225*  --   --   BILITOT 0.9  --  1.0  --   --   PROT 6.0*  --  6.0*  --   --   ALBUMIN 2.0* 2.0* 2.0* 1.9* 2.0*   No results for input(s): LIPASE, AMYLASE in the last 168 hours. Recent Labs  Lab 10/28/20 1152  AMMONIA 35    ABG    Component Value Date/Time   PHART 7.452 (H) 10/29/2020 0418   PCO2ART 32.7 10/29/2020 0418   PO2ART 142 (H) 10/29/2020 0418   HCO3 22.9 10/29/2020 0418   TCO2 24 10/29/2020 0418   ACIDBASEDEF 1.0 10/27/2020 2111   O2SAT 99.0 10/29/2020 0418     Coagulation Profile: Recent Labs  Lab 10/27/20 2320  INR 1.4*    Cardiac Enzymes: No results for input(s): CKTOTAL, CKMB,  CKMBINDEX, TROPONINI in the last 168 hours.  HbA1C: Hgb A1c MFr Bld  Date/Time Value Ref Range Status  10/12/2020 04:10 PM 7.7 (H) 4.8 - 5.6 % Final    Comment:    (NOTE) Pre diabetes:          5.7%-6.4%  Diabetes:              >6.4%  Glycemic control for   <7.0% adults with diabetes     CBG: Recent Labs  Lab 10/28/20 1712 10/28/20 1946 10/29/20 0003 10/29/20 0412 10/29/20 0808  GLUCAP 217* 232* 243* 195* 72      Critical care time:    Performed by: Johnsie Cancel  Total critical care time: 42 minutes  Critical care time was exclusive of separately billable procedures and treating other patients.  Critical care was necessary to treat or prevent imminent or life-threatening deterioration.  Critical care was time spent personally by me on the following activities: development of treatment plan with patient and/or surrogate as well as nursing, discussions with consultants, evaluation of patient's response to treatment, examination of patient, obtaining history from patient or surrogate, ordering and performing treatments and interventions, ordering and review of laboratory studies, ordering and review of radiographic studies, pulse oximetry and re-evaluation of patient's condition.  Johnsie Cancel, NP-C [facility] Contact / Pager information can be found on Amion  10/29/2020, 7:51 AM

## 2020-10-29 NOTE — Progress Notes (Signed)
Hypoglycemic Event  CBG: 56 @ 1114  Treatment: amp D50, change D5W to D10W  Symptoms: none noted  Follow-up CBG: Time: 1200 CBG Result: 162  Possible Reasons for Event: tube feeds on hold, residual Levemir from last night  Comments/MD notified: Ruthann Cancer MD notified    Waneta Martins

## 2020-10-29 NOTE — Progress Notes (Signed)
PT Cancellation Note  Patient Details Name: Dennis Carey MRN: [id number] DOB: 05/26/1935   Cancelled Treatment:    Reason Eval/Treat Not Completed: Medical issues which prohibited therapy;Patient not medically ready (Nurse Asked to HOLD PT until Wednesday. Will check back Wednesday.)   [name] 10/29/2020, 9:55 AM  [name],PT Acute Rehabilitation Services Pager:  [phone]  Office:  [phone]

## 2020-10-29 NOTE — Progress Notes (Signed)
Patient transported on vent to CT and returned to [id number] without complications.

## 2020-10-29 NOTE — Progress Notes (Signed)
[place] [facility] Progress Note Patient Name: Dennis Carey DOB: 02-07-1935 MRN: [id number]   Date of Service  10/29/2020  HPI/Events of Note  Agitation/Ventilator asynchrony - Nursing reports giving Fentanyl 100 mcg IV Q 30 min to keep the patient from having significant asynchrony and requests a Fentanyl IV infusion. Patient has significant ileus d/t retroperitoneal bleeding. Fentanyl IV infusion will make the ileus worse.   eICU Interventions  Plan: 1. Precedex IV infusion. Titrate to RASS = 0.      Intervention Category Major Interventions: Delirium, psychosis, severe agitation - evaluation and management  [place] Freshour Eugene 10/29/2020, 11:09 PM

## 2020-10-29 NOTE — Progress Notes (Signed)
  Had family meeting today with Merlene Laughter NP (CCM) and Eloise Harman, RN   We updated family on current situation and discussed treatments options.   Discussion well outlined in Ms. Davis's note.  For now will proceed with aggressive care with possible tracheostomy later this week. However would not want CPR or defibrillation in setting of cardiac arrest. (Chart updated).   Will continue to re-assess daily. Hopefully we can make some progress with his condition over the next 24-48 hours.   Additional 35 mins CCT  Glori Bickers, MD  7:07 PM

## 2020-10-29 NOTE — Progress Notes (Addendum)
Patient ID: Dennis Carey, male   DOB: November 03, 1935, 85 y.o.   MRN: [id number]     Advanced Heart Failure Rounding Note   Subjective:    12/7 Bronch with copious secretions that were removed.  12/8 Extubated but required re-intubation.  12/15 started HD 12/17 switched to CVVHD 12/18 Large RP hemorrhage. Transfused  Overnight event: Increased pressor requirement overnight. CVP was low at 1. No improvement w/ 1L fluid bolus.  Bedside US with hyperdynamic function, underfilled RV, collapsible IVC. Given 2 units RBCs.  Hgb up from 9.3>> 12.8 today.   He is awake on vent and moving extremities.   Continues on CRRT. On NE 14. Oliguric. CVP 2-4   Off Vanc/cefipime. WBC trending back up 9.9>>13>>17>>18>>20>>23K. AF   Daughter at bedside.    Echo 12/18 EF back to 55-60%     Objective:   Weight Range:  Vital Signs:   Temp:  [96 F (35.6 C)-99.3 F (37.4 C)] 98.7 F (37.1 C) (12/20 0800) Pulse Rate:  [67-95] 92 (12/20 0830) Resp:  [21-50] 29 (12/20 0845) BP: (78-120)/(42-71) 108/51 (12/20 0729) SpO2:  [95 %-100 %] 98 % (12/20 0830) Arterial Line BP: (63-131)/(42-65) 110/48 (12/20 0845) FiO2 (%):  [40 %] 40 % (12/20 0800) Weight:  [69.9 kg] 69.9 kg (12/20 0500) Last BM Date: 10/29/20  Weight change: Filed Weights   10/27/20 0500 10/28/20 0500 10/29/20 0500  Weight: 67.9 kg 68.9 kg 69.9 kg    Intake/Output:   Intake/Output Summary (Last 24 hours) at 10/29/2020 0902 Last data filed at 10/29/2020 0800 Gross per 24 hour  Intake 4977.34 ml  Output 1191 ml  Net 3786.34 ml     PHYSICAL EXAM: CVP 2-4  General:  Elderly male, intubated and awake. No distress  HEENT: normal + ETT Neck: supple. no JVD. Carotids 2+ bilat; no bruits. No lymphadenopathy or thyromegaly appreciated. Cor: PMI nondisplaced. Regular rate & rhythm. No rubs, gallops or murmurs. Lungs: intubated and course  Abdomen: soft, nontender, nondistended. No hepatosplenomegaly. No bruits or masses. Good  bowel sounds. Extremities: no cyanosis, clubbing, rash, trace bilateral LEE edema Neuro: intubated but awake. moves all 4 extremities, follows commands.    Telemetry: Sinus 80-90 Personally reviewed  Labs: Basic Metabolic Panel: Recent Labs  Lab 10/24/20 0440 10/25/20 0422 10/26/20 0456 10/27/20 0355 10/27/20 1620 10/27/20 1948 10/27/20 2018 10/27/20 2111 10/28/20 0328 10/28/20 1404 10/29/20 0418  NA 150* 146*   < > 139 136   < > 134* 133* 133* 135 134*  136  K 3.9 4.2   < > 4.5 5.3*   < > 5.9* 5.9* 6.0* 5.1 4.8  4.7  CL 110 107   < > 102 101  --  101  --  101 103 102  CO2 22 24   < > 22 25  --  23  --  21* 20* 20*  GLUCOSE 288* 231*   < > 134* 182*  --  273*  --  180* 176* 206*  BUN 124* 97*   < > 86* 68*  --  66*  --  83* 69* 65*  CREATININE 3.88* 3.23*   < > 3.03* 2.36*  --  2.35*  --  2.60* 2.08* 1.94*  CALCIUM 7.8* 7.7*   < > 7.7* 7.8*  --  7.3*  --  7.2* 7.2* 7.1*  MG 2.7* 2.5*  --  2.4  --   --   --   --  2.5*  --  2.8*  PHOS 8.2* 6.5*  --  8.2* 5.7*  --   --   --  9.2* 6.8* 5.5*   < > = values in this interval not displayed.    Liver Function Tests: Recent Labs  Lab 10/27/20 2018 10/28/20 0328 10/28/20 1152 10/28/20 1404 10/29/20 0418  AST 324*  --  203*  --   --   ALT 552*  --  388*  --   --   ALKPHOS 246*  --  225*  --   --   BILITOT 0.9  --  1.0  --   --   PROT 6.0*  --  6.0*  --   --   ALBUMIN 2.0* 2.0* 2.0* 1.9* 2.0*   No results for input(s): LIPASE, AMYLASE in the last 168 hours. Recent Labs  Lab 10/28/20 1152  AMMONIA 35    CBC: Recent Labs  Lab 10/27/20 2018 10/27/20 2111 10/28/20 0328 10/28/20 1004 10/28/20 1708 10/28/20 2007 10/29/20 0418  WBC 13.0*  --  17.5* 17.2* 18.8* 19.9* 22.5*  NEUTROABS 11.2*  --   --   --   --   --   --   HGB 7.1*   < > 9.8* 10.5* 9.5* 9.3* 12.8*  12.2*  HCT 23.5*   < > 29.7* 31.0* 27.7* 27.2* 36.9*  36.0*  MCV 93.6  --  88.4 85.6 85.5 85.5 85.6  PLT 262  --  201 173 162 170 164   < > = values in  this interval not displayed.    Cardiac Enzymes: No results for input(s): CKTOTAL, CKMB, CKMBINDEX, TROPONINI in the last 168 hours.  BNP: BNP (last 3 results) Recent Labs    10/13/20 1513  BNP 589.0*    ProBNP (last 3 results) No results for input(s): PROBNP in the last 8760 hours.    Other results:  Imaging: CT HEAD WO CONTRAST  Result Date: 10/27/2020 CLINICAL DATA:  Encephalopathy EXAM: CT HEAD WITHOUT CONTRAST TECHNIQUE: Contiguous axial images were obtained from the base of the skull through the vertex without intravenous contrast. COMPARISON:  05/25/2018 FINDINGS: Brain: There is no mass, hemorrhage or extra-axial collection. The size and configuration of the ventricles and extra-axial CSF spaces are normal. There is hypoattenuation of the white matter, most commonly indicating chronic small vessel disease. Vascular: No abnormal hyperdensity of the major intracranial arteries or dural venous sinuses. No intracranial atherosclerosis. Skull: The visualized skull base, calvarium and extracranial soft tissues are normal. Sinuses/Orbits: No fluid levels or advanced mucosal thickening of the visualized paranasal sinuses. No mastoid or middle ear effusion. The orbits are normal. IMPRESSION: Chronic small vessel disease without acute intracranial abnormality. Electronically Signed   By: Ulyses Jarred M.D.   On: 10/27/2020 22:43   CT ANGIO CHEST PE W OR WO CONTRAST  Result Date: 10/27/2020 CLINICAL DATA:  Hypoxia, intubated, mental status change EXAM: CT ANGIOGRAPHY CHEST WITH CONTRAST TECHNIQUE: Multidetector CT imaging of the chest was performed using the standard protocol during bolus administration of intravenous contrast. Multiplanar CT image reconstructions and MIPs were obtained to evaluate the vascular anatomy. CONTRAST:  177m OMNIPAQUE IOHEXOL 350 MG/ML SOLN COMPARISON:  10/27/2020 FINDINGS: Cardiovascular: This is a technically adequate evaluation of the pulmonary vasculature.  No filling defects or pulmonary emboli. Heart is unremarkable without pericardial effusion. Normal caliber of the thoracic aorta. Mediastinum/Nodes: Endotracheal tube identified, just above carina. Enteric catheter extends into the gastric lumen. Thyroid is unremarkable. No pathologic adenopathy. Lungs/Pleura: There are small bilateral pleural effusions. Patchy bilateral areas of ground-glass airspace disease are identified.  There is denser consolidation within the lateral aspect of the right lower lobe. No pneumothorax. Upper Abdomen: Large area of retroperitoneal hemorrhage is seen on the left, measuring 11.5 x 8.6 cm in transverse dimension. This is incompletely imaged on this exam due to slice selection, with the retroperitoneal hemorrhage extending inferiorly beyond the field of view. No evidence of active contrast extravasation. Extensive atherosclerosis of the abdominal aorta. Musculoskeletal: No acute or destructive bony lesions. Reconstructed images demonstrate no additional findings. Review of the MIP images confirms the above findings. IMPRESSION: 1. Large left-sided retroperitoneal hematoma, incompletely evaluated on this study due to slice selection. 2. No evidence of pulmonary embolus. 3. Multifocal bilateral ground-glass airspace disease which may reflect atypical pneumonia. 4. Small bilateral pleural effusions. 5.  Aortic Atherosclerosis (ICD10-I70.0). Critical Value/emergent results were called by telephone at the time of interpretation on 10/27/2020 at 10:48 pm to provider [facility] DAVIS , who verbally acknowledged these results. Electronically Signed   By: Randa Ngo M.D.   On: 10/27/2020 22:49   DG CHEST PORT 1 VIEW  Result Date: 10/27/2020 CLINICAL DATA:  Hypoxia, intubated EXAM: PORTABLE CHEST 1 VIEW COMPARISON:  10/27/2020 at 5:39 p.m. FINDINGS: Single frontal view of the chest demonstrates stable endotracheal tube, enteric catheter, right internal jugular catheter, and left-sided  PICC. Cardiac silhouette is unremarkable. There is patchy right basilar airspace disease unchanged. Improved aeration at the left lung base. Small right pleural effusion is suspected. No pneumothorax. IMPRESSION: 1. Persistent right basilar consolidation and small effusion. Findings could reflect pneumonia or asymmetric edema. 2. Improved aeration at the left lung base. 3. Stable support devices. Electronically Signed   By: Randa Ngo M.D.   On: 10/27/2020 21:17   ECHOCARDIOGRAM LIMITED  Result Date: 10/27/2020    ECHOCARDIOGRAM LIMITED REPORT   Patient Name:   MUAAZ BRAU Date of Exam: 10/27/2020 Medical Rec #:  [id number]      Height:       67.0 in Accession #:    [id number]     Weight:       149.7 lb Date of Birth:  12/02/1934     BSA:          1.788 m Patient Age:    80 years       BP:           146/48 mmHg Patient Gender: M              HR:           96 bpm. Exam Location:  Inpatient Procedure: Limited Echo, Color Doppler and Cardiac Doppler Indications:    M03.49 Acute systolic (congestive) heart failure  History:        Patient has prior history of Echocardiogram examinations, most                 recent 10/13/2020. CHF; Risk Factors:Hypertension and                 Dyslipidemia.  Sonographer:    Raquel Sarna Senior RDCS Referring Phys: [id number] Marlaysia Lenig R Buelah Rennie IMPRESSIONS  1. Left ventricular ejection fraction, by estimation, is 55 to 60%. The left ventricle has normal function. The left ventricle has no regional wall motion abnormalities. Left ventricular diastolic function could not be evaluated.  2. Right ventricular systolic function is normal. The right ventricular size is normal.  3. The mitral valve is normal in structure. Trivial mitral valve regurgitation.  4. The aortic valve was not assessed.  5. The inferior vena  cava is normal in size with greater than 50% respiratory variability, suggesting right atrial pressure of 3 mmHg. Comparison(s): Compared to prior TTE on 10/23/2020, the LVEF appears  significantly improved to 55-60%. FINDINGS  Left Ventricle: Left ventricular ejection fraction, by estimation, is 55 to 60%. The left ventricle has normal function. The left ventricle has no regional wall motion abnormalities. Left ventricular diastolic function could not be evaluated. Right Ventricle: The right ventricular size is normal. Right ventricular systolic function is normal. Left Atrium: Left atrial size was normal in size. Right Atrium: Right atrial size was normal in size. Mitral Valve: The mitral valve is normal in structure. There is mild thickening of the mitral valve leaflet(s). Trivial mitral valve regurgitation. Tricuspid Valve: The tricuspid valve is grossly normal. Aortic Valve: The aortic valve was not assessed. Pulmonic Valve: The pulmonic valve was not assessed. Venous: The inferior vena cava is normal in size with greater than 50% respiratory variability, suggesting right atrial pressure of 3 mmHg. RIGHT VENTRICLE TAPSE (M-mode): 1.6 cm Gwyndolyn Kaufman MD Electronically signed by Gwyndolyn Kaufman MD Signature Date/Time: 10/27/2020/1:43:21 PM    Final    US Abdomen Limited RUQ (LIVER/GB)  Result Date: 10/28/2020 CLINICAL DATA:  Transaminitis. EXAM: ULTRASOUND ABDOMEN LIMITED RIGHT UPPER QUADRANT COMPARISON:  None. FINDINGS: Gallbladder: No gallstones or wall thickening visualized. No sonographic Murphy sign noted by sonographer. Common bile duct: Diameter: Approximately 2 mm, although suboptimally visualized. No evidence of intrahepatic biliary ductal dilatation. Liver: No focal lesion identified. Within normal limits in parenchymal echogenicity. Portal vein is patent on color Doppler imaging with normal direction of blood flow towards the liver. Other: None. IMPRESSION: Negative.  No hepatobiliary abnormality identified. Electronically Signed   By: Marlaine Hind M.D.   On: 10/28/2020 06:43     Medications:     Scheduled Medications: . sodium chloride   Intravenous Once  .  amiodarone  200 mg Per Tube BID  . bethanechol  10 mg Per Tube TID  . chlorhexidine gluconate (MEDLINE KIT)  15 mL Mouth Rinse BID  . Chlorhexidine Gluconate Cloth  6 each Topical Daily  . clotrimazole  10 mg Oral TID  . docusate  100 mg Per Tube BID  . ezetimibe  10 mg Per Tube Daily  . feeding supplement (PROSource TF)  45 mL Per Tube BID  . free water  100 mL Per Tube Q4H  . insulin aspart  0-15 Units Subcutaneous Q4H  . insulin aspart  5 Units Subcutaneous Q4H  . insulin detemir  20 Units Subcutaneous BID  . mouth rinse  15 mL Mouth Rinse 10 times per day  . metoCLOPramide (REGLAN) injection  5 mg Intravenous Q8H  . pantoprazole sodium  40 mg Per Tube Q1200  . polyethylene glycol  17 g Per Tube Daily  . rosuvastatin  20 mg Per Tube Daily  . sodium chloride flush  10-40 mL Intracatheter Q12H  . sodium chloride flush  10-40 mL Intracatheter Q12H    Infusions: . sodium chloride Stopped (10/26/20 0828)  . sodium chloride Stopped (10/29/20 0700)  . sodium chloride    . sodium chloride Stopped (10/28/20 1804)  . dextrose 50 mL/hr at 10/29/20 0824  . feeding supplement (VITAL 1.5 CAL) Stopped (10/29/20 0457)  . norepinephrine (LEVOPHED) Adult infusion 12 mcg/min (10/29/20 0800)  . phenylephrine (NEO-SYNEPHRINE) Adult infusion Stopped (10/28/20 0141)  . prismasol BGK 2/2.5 dialysis solution 1,000 mL/hr at 10/29/20 0201  . prismasol BGK 2/2.5 replacement solution 400 mL/hr at 10/28/20 1625  .  prismasol BGK 2/2.5 replacement solution      PRN Medications: Place/Maintain arterial line **AND** sodium chloride, sodium chloride, sodium chloride, sodium chloride, acetaminophen (TYLENOL) oral liquid 160 mg/5 mL, alteplase, fentaNYL (SUBLIMAZE) injection, heparin, heparin, heparin, heparin, ipratropium-albuterol, ondansetron **OR** ondansetron (ZOFRAN) IV, sodium chloride flush   Assessment/Plan:   1. Acute Hypoxemic Respiratory Failure - due to PNA and HF - Developed acute respiratory  failure on 12/4 and required intubation.  - PNA much improved. PCT 109 -> 84->43 -> 18 - Extubated 12/8 but required re intubation.  - Sputum cultures w/ normal respiratory flora.  - Completed vanc/meropenem but WBC up trending, 22K today. AF  - Repeat cultures. Repeat CXR. Will d/w PCCM restart of empiric abx  - We had been working toward extubation but now had setback with RP bleed. May need trach this week  2. Shock - Initially sepsis/cardiogenic combined. Now with component of hemorrhagic shock in setting of RP bleed - Echo 12/4 EF with EF 25-30% and WMA. RV normal.   - Echo 12/18 EF 55-60% - Completed vanc/mero on 12/15. Initial cultures negative. Now w/ worsening leukocytosis. WBC up to 22K. ? Redeveloping septic shock. CVP still low despite volume resuscitation.  - Repeat Lactic acid  - Reculture and repeat CXR. May need to restart abx.  - Back on NE 14. Wean as tolerated   3. Acute Systolic Heart Failure  - As above Echo 12/4 EF 25-30% . No previous ECHO. No previous cath.  - Hs Trop [phone].  - Echo 12/18 EF 55-60%.  - RHC 12/6 with low filling pressures after diuresis and low index 1.9. SVR 1467 - Unable to add GDMT due to shock/ARF. - Management as above - CVP low 2-4  - Suspect combination of septic and cardiogenic shock. Troponin moderately elevated but flat. Possibility of Tako-tsubo/septic CM has been raised and seems to fit clinically but cannot rule out CAD.  If he recovers and creatinine comes down, cosndier coronary angiography.   4. Large RP bleed with acute blood loss anemia - developed on 12/18 - transfused 3 units 12/18 - transfused 2 units 12/19 - Hgb 12.8 today   5. PAF - in NSR on po amio - off heparin with RP bleed  6. Back Pain--> S/P L5-S1 laminectomy with fusion 09/28/2020  Per Neurosugery  7. AKI - Started HD on 12/15. Plan as above  8. Ileus - Improved, has had BM though still distended.    9. Hypernatremia - Resolved  10.  Debility - suspect ICU myopathy   Length of Stay: [address], PA-C  9:02 AM  Agree with above.   Remains on vent. He continues to struggle with ongoing ab pain. CT shows enlarged RP bleed. Concern for ab compartment syndrome but lactate ok. Remains on NE 12. Will awaken at times and follow some commands.   General:  Ill appearing. Awake on vent HEENT: normal + ETT Neck: supple. RIJ tiralysis  Carotids 2+ bilat; no bruits. No lymphadenopathy or thryomegaly appreciated. Cor: PMI nondisplaced. Regular rate & rhythm. No rubs, gallops or murmurs. Lungs: coarse Abdomen: soft, +distended. Mildly tender No rebound  No hepatosplenomegaly. No bruits or masses. Good bowel sounds. Extremities: no cyanosis, clubbing, rash, edema Neuro: awake on vent will follow occasional commands  He continues to struggle. Unfortunately has had a setback with large RP bleed with shock and hypotension in setting of ongoing multi-system organ failure. Continue support with NE and blood products as needed. Family meeting later today  to discuss goals of care and next steps. Full support for now.   Glori Bickers, MD

## 2020-10-29 NOTE — Progress Notes (Signed)
Patient placed back on full vent support prior to transport to CT.

## 2020-10-29 NOTE — Progress Notes (Signed)
[facility] notified of c/f infection/sepsis. WBC trending up, now 22.5. Volume status questionable (continued pressor need, low CVPs ~4). Skin on torso and legs more mottled than start of shift. 98.9 axillary, but fever would likely be masked by CRRT.  Per [facility] RN, let day team decide how to address. No orders at this time. Will continue to monitor closely.

## 2020-10-29 NOTE — Plan of Care (Signed)
  Interdisciplinary Goals of Care Family Meeting   Date carried out:: 10/29/2020  Location of the meeting: Conference room  Member's involved: Consulting Physician Dr. Haroldine Laws, Nurse Practitioner Merlene Laughter, Bedside Registered Nurse Marcene Brawn, Family Member including patients spouse, son, and both daughters.   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 These complication combined with multiple comorbidities and chronic pain promoted the discussion of plan of care. 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 Neither of these scenarios exist making the [place] decision harder for his family.  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 As of right now they family wished to continue further discussion before a definitive decision will be made but continue with all aggressive measures currently.  We additionally discussed code status for [name]. The entire family agreed that in the event of a cardiac arrest 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 Therefore, code status will be updated to DNR.   Code status: Full DNR  Disposition: Continue current acute care  Time spent for the meeting: 67min   Mandela Bello F Aloha Bartok, NP-C [place] / Pager information can be found on Amion  10/29/2020, 6:27 PM

## 2020-10-29 NOTE — Progress Notes (Signed)
Per [place] MD, okay to prime CRRT filter with heparinized saline.

## 2020-10-29 NOTE — Progress Notes (Signed)
Patient ID: Dennis Carey, male   DOB: 1935/11/02, 85 y.o.   MRN: [id number] S: Low CVP and hypotension overnight with increasing vasopressor requirements despite blood transfusion of 2 units overnight for hemorrhagic shock.  Pt seen and examined while on CRRT. O:BP (!) 108/51   Pulse 92   Temp 98.7 F (37.1 C) (Oral)   Resp (!) 30   Ht _0  (1.702 m)   Wt 69.9 kg   SpO2 98%   BMI 24.14 kg/m   Intake/Output Summary (Last 24 hours) at 10/29/2020 2671 Last data filed at 10/29/2020 0900 Gross per 24 hour  Intake 5017.04 ml  Output 1209 ml  Net 3808.04 ml   Intake/Output: I/O last 3 completed shifts: In: 7660.1 [I.V.:2035.1; IWPYK:9983; JA/SN:0539] Out: 1227 [Urine:83; Other:1107; Blood:37]  Intake/Output this shift:  Total I/O In: 48.8 [I.V.:48.8] Out: 68 [Other:68] Weight change: 1 kg Gen: intubated and resting comfortably, able to nod CVS: RRR Resp: occ rhonchi Abd: +BS, soft, NT/ND Ext: trace edema  Recent Labs  Lab 10/24/20 0440 10/25/20 0422 10/26/20 0456 10/26/20 1602 10/27/20 0355 10/27/20 1620 10/27/20 1948 10/27/20 2018 10/27/20 2111 10/28/20 0328 10/28/20 1152 10/28/20 1404 10/29/20 0418  NA 150* 146*   < > 141 139 136 136 134* 133* 133*  --  135 134*  136  K 3.9 4.2   < > 4.2 4.5 5.3* 5.7* 5.9* 5.9* 6.0*  --  5.1 4.8  4.7  CL 110 107   < > 103 102 101  --  101  --  101  --  103 102  CO2 22 24   < > _1 --  23  --  21*  --  20* 20*  GLUCOSE 288* 231*   < > 223* 134* 182*  --  273*  --  180*  --  176* 206*  BUN 124* 97*   < > 83* 86* 68*  --  66*  --  83*  --  69* 65*  CREATININE 3.88* 3.23*   < > 3.24* 3.03* 2.36*  --  2.35*  --  2.60*  --  2.08* 1.94*  ALBUMIN  --   --   --   --  2.1* 2.2*  --  2.0*  --  2.0* 2.0* 1.9* 2.0*  CALCIUM 7.8* 7.7*   < > 7.5* 7.7* 7.8*  --  7.3*  --  7.2*  --  7.2* 7.1*  PHOS 8.2* 6.5*  --   --  8.2* 5.7*  --   --   --  9.2*  --  6.8* 5.5*  AST  --   --   --   --   --   --   --  324*  --   --  203*  --   --    ALT  --   --   --   --   --   --   --  552*  --   --  388*  --   --    < > = values in this interval not displayed.   Liver Function Tests: Recent Labs  Lab 10/27/20 2018 10/28/20 0328 10/28/20 1152 10/28/20 1404 10/29/20 0418  AST 324*  --  203*  --   --   ALT 552*  --  388*  --   --   ALKPHOS 246*  --  225*  --   --   BILITOT 0.9  --  1.0  --   --  PROT 6.0*  --  6.0*  --   --   ALBUMIN 2.0*   < > 2.0* 1.9* 2.0*   < > = values in this interval not displayed.   No results for input(s): LIPASE, AMYLASE in the last 168 hours. Recent Labs  Lab 10/28/20 1152  AMMONIA 35   CBC: Recent Labs  Lab 10/27/20 2018 10/27/20 2111 10/28/20 0328 10/28/20 1004 10/28/20 1708 10/28/20 2007 10/29/20 0418  WBC 13.0*  --  17.5* 17.2* 18.8* 19.9* 22.5*  NEUTROABS 11.2*  --   --   --   --   --   --   HGB 7.1*   < > 9.8* 10.5* 9.5* 9.3* 12.8*  12.2*  HCT 23.5*   < > 29.7* 31.0* 27.7* 27.2* 36.9*  36.0*  MCV 93.6  --  88.4 85.6 85.5 85.5 85.6  PLT 262  --  201 173 162 170 164   < > = values in this interval not displayed.   Cardiac Enzymes: No results for input(s): CKTOTAL, CKMB, CKMBINDEX, TROPONINI in the last 168 hours. CBG: Recent Labs  Lab 10/28/20 1712 10/28/20 1946 10/29/20 0003 10/29/20 0412 10/29/20 0808  GLUCAP 217* 232* 243* 195* 72    Iron Studies: No results for input(s): IRON, TIBC, TRANSFERRIN, FERRITIN in the last 72 hours. Studies/Results: CT HEAD WO CONTRAST  Result Date: 10/27/2020 CLINICAL DATA:  Encephalopathy EXAM: CT HEAD WITHOUT CONTRAST TECHNIQUE: Contiguous axial images were obtained from the base of the skull through the vertex without intravenous contrast. COMPARISON:  05/25/2018 FINDINGS: Brain: There is no mass, hemorrhage or extra-axial collection. The size and configuration of the ventricles and extra-axial CSF spaces are normal. There is hypoattenuation of the white matter, most commonly indicating chronic small vessel disease. Vascular: No  abnormal hyperdensity of the major intracranial arteries or dural venous sinuses. No intracranial atherosclerosis. Skull: The visualized skull base, calvarium and extracranial soft tissues are normal. Sinuses/Orbits: No fluid levels or advanced mucosal thickening of the visualized paranasal sinuses. No mastoid or middle ear effusion. The orbits are normal. IMPRESSION: Chronic small vessel disease without acute intracranial abnormality. Electronically Signed   By: Ulyses Jarred M.D.   On: 10/27/2020 22:43   CT ANGIO CHEST PE W OR WO CONTRAST  Result Date: 10/27/2020 CLINICAL DATA:  Hypoxia, intubated, mental status change EXAM: CT ANGIOGRAPHY CHEST WITH CONTRAST TECHNIQUE: Multidetector CT imaging of the chest was performed using the standard protocol during bolus administration of intravenous contrast. Multiplanar CT image reconstructions and MIPs were obtained to evaluate the vascular anatomy. CONTRAST:  162m OMNIPAQUE IOHEXOL 350 MG/ML SOLN COMPARISON:  10/27/2020 FINDINGS: Cardiovascular: This is a technically adequate evaluation of the pulmonary vasculature. No filling defects or pulmonary emboli. Heart is unremarkable without pericardial effusion. Normal caliber of the thoracic aorta. Mediastinum/Nodes: Endotracheal tube identified, just above carina. Enteric catheter extends into the gastric lumen. Thyroid is unremarkable. No pathologic adenopathy. Lungs/Pleura: There are small bilateral pleural effusions. Patchy bilateral areas of ground-glass airspace disease are identified. There is denser consolidation within the lateral aspect of the right lower lobe. No pneumothorax. Upper Abdomen: Large area of retroperitoneal hemorrhage is seen on the left, measuring 11.5 x 8.6 cm in transverse dimension. This is incompletely imaged on this exam due to slice selection, with the retroperitoneal hemorrhage extending inferiorly beyond the field of view. No evidence of active contrast extravasation. Extensive  atherosclerosis of the abdominal aorta. Musculoskeletal: No acute or destructive bony lesions. Reconstructed images demonstrate no additional findings. Review of the  MIP images confirms the above findings. IMPRESSION: 1. Large left-sided retroperitoneal hematoma, incompletely evaluated on this study due to slice selection. 2. No evidence of pulmonary embolus. 3. Multifocal bilateral ground-glass airspace disease which may reflect atypical pneumonia. 4. Small bilateral pleural effusions. 5.  Aortic Atherosclerosis (ICD10-I70.0). Critical Value/emergent results were called by telephone at the time of interpretation on 10/27/2020 at 10:48 pm to provider [facility] DAVIS , who verbally acknowledged these results. Electronically Signed   By: Randa Ngo M.D.   On: 10/27/2020 22:49   DG CHEST PORT 1 VIEW  Result Date: 10/27/2020 CLINICAL DATA:  Hypoxia, intubated EXAM: PORTABLE CHEST 1 VIEW COMPARISON:  10/27/2020 at 5:39 p.m. FINDINGS: Single frontal view of the chest demonstrates stable endotracheal tube, enteric catheter, right internal jugular catheter, and left-sided PICC. Cardiac silhouette is unremarkable. There is patchy right basilar airspace disease unchanged. Improved aeration at the left lung base. Small right pleural effusion is suspected. No pneumothorax. IMPRESSION: 1. Persistent right basilar consolidation and small effusion. Findings could reflect pneumonia or asymmetric edema. 2. Improved aeration at the left lung base. 3. Stable support devices. Electronically Signed   By: Randa Ngo M.D.   On: 10/27/2020 21:17   ECHOCARDIOGRAM LIMITED  Result Date: 10/27/2020    ECHOCARDIOGRAM LIMITED REPORT   Patient Name:   HOKE BAER Date of Exam: 10/27/2020 Medical Rec #:  [id number]      Height:       67.0 in Accession #:    [id number]     Weight:       149.7 lb Date of Birth:  Jan 11, 1935     BSA:          1.788 m Patient Age:    53 years       BP:           146/48 mmHg Patient Gender: M               HR:           96 bpm. Exam Location:  Inpatient Procedure: Limited Echo, Color Doppler and Cardiac Doppler Indications:    Y40.34 Acute systolic (congestive) heart failure  History:        Patient has prior history of Echocardiogram examinations, most                 recent 10/13/2020. CHF; Risk Factors:Hypertension and                 Dyslipidemia.  Sonographer:    Raquel Sarna Senior RDCS Referring Phys: [id number] DANIEL R BENSIMHON IMPRESSIONS  1. Left ventricular ejection fraction, by estimation, is 55 to 60%. The left ventricle has normal function. The left ventricle has no regional wall motion abnormalities. Left ventricular diastolic function could not be evaluated.  2. Right ventricular systolic function is normal. The right ventricular size is normal.  3. The mitral valve is normal in structure. Trivial mitral valve regurgitation.  4. The aortic valve was not assessed.  5. The inferior vena cava is normal in size with greater than 50% respiratory variability, suggesting right atrial pressure of 3 mmHg. Comparison(s): Compared to prior TTE on 11/04/2020, the LVEF appears significantly improved to 55-60%. FINDINGS  Left Ventricle: Left ventricular ejection fraction, by estimation, is 55 to 60%. The left ventricle has normal function. The left ventricle has no regional wall motion abnormalities. Left ventricular diastolic function could not be evaluated. Right Ventricle: The right ventricular size is normal. Right ventricular systolic function is normal. Left Atrium:  Left atrial size was normal in size. Right Atrium: Right atrial size was normal in size. Mitral Valve: The mitral valve is normal in structure. There is mild thickening of the mitral valve leaflet(s). Trivial mitral valve regurgitation. Tricuspid Valve: The tricuspid valve is grossly normal. Aortic Valve: The aortic valve was not assessed. Pulmonic Valve: The pulmonic valve was not assessed. Venous: The inferior vena cava is normal in size with greater than  50% respiratory variability, suggesting right atrial pressure of 3 mmHg. RIGHT VENTRICLE TAPSE (M-mode): 1.6 cm Gwyndolyn Kaufman MD Electronically signed by Gwyndolyn Kaufman MD Signature Date/Time: 10/27/2020/1:43:21 PM    Final    US Abdomen Limited RUQ (LIVER/GB)  Result Date: 10/28/2020 CLINICAL DATA:  Transaminitis. EXAM: ULTRASOUND ABDOMEN LIMITED RIGHT UPPER QUADRANT COMPARISON:  None. FINDINGS: Gallbladder: No gallstones or wall thickening visualized. No sonographic Murphy sign noted by sonographer. Common bile duct: Diameter: Approximately 2 mm, although suboptimally visualized. No evidence of intrahepatic biliary ductal dilatation. Liver: No focal lesion identified. Within normal limits in parenchymal echogenicity. Portal vein is patent on color Doppler imaging with normal direction of blood flow towards the liver. Other: None. IMPRESSION: Negative.  No hepatobiliary abnormality identified. Electronically Signed   By: Marlaine Hind M.D.   On: 10/28/2020 06:43   . sodium chloride   Intravenous Once  . amiodarone  200 mg Per Tube BID  . bethanechol  10 mg Per Tube TID  . chlorhexidine gluconate (MEDLINE KIT)  15 mL Mouth Rinse BID  . Chlorhexidine Gluconate Cloth  6 each Topical Daily  . clotrimazole  10 mg Oral TID  . docusate  100 mg Per Tube BID  . ezetimibe  10 mg Per Tube Daily  . feeding supplement (PROSource TF)  45 mL Per Tube BID  . free water  100 mL Per Tube Q4H  . insulin aspart  0-15 Units Subcutaneous Q4H  . insulin aspart  5 Units Subcutaneous Q4H  . insulin detemir  20 Units Subcutaneous BID  . mouth rinse  15 mL Mouth Rinse 10 times per day  . metoCLOPramide (REGLAN) injection  5 mg Intravenous Q8H  . pantoprazole sodium  40 mg Per Tube Q1200  . polyethylene glycol  17 g Per Tube Daily  . rosuvastatin  20 mg Per Tube Daily  . sodium chloride flush  10-40 mL Intracatheter Q12H  . sodium chloride flush  10-40 mL Intracatheter Q12H    BMET    Component Value  Date/Time   NA 136 10/29/2020 0418   NA 134 (L) 10/29/2020 0418   K 4.7 10/29/2020 0418   K 4.8 10/29/2020 0418   CL 102 10/29/2020 0418   CO2 20 (L) 10/29/2020 0418   GLUCOSE 206 (H) 10/29/2020 0418   BUN 65 (H) 10/29/2020 0418   CREATININE 1.94 (H) 10/29/2020 0418   CALCIUM 7.1 (L) 10/29/2020 0418   GFRNONAA 34 (L) 10/29/2020 0418   GFRAA 58 (L) 06/08/2020 0647   CBC    Component Value Date/Time   WBC 22.5 (H) 10/29/2020 0418   RBC 4.31 10/29/2020 0418   HGB 12.8 (L) 10/29/2020 0418   HGB 12.2 (L) 10/29/2020 0418   HCT 36.9 (L) 10/29/2020 0418   HCT 36.0 (L) 10/29/2020 0418   PLT 164 10/29/2020 0418   MCV 85.6 10/29/2020 0418   MCH 29.7 10/29/2020 0418   MCHC 34.7 10/29/2020 0418   RDW 14.2 10/29/2020 0418   LYMPHSABS 1.0 10/27/2020 2018   MONOABS 0.5 10/27/2020 2018   EOSABS 0.1  10/27/2020 2018   BASOSABS 0.0 10/27/2020 2018     Assessment/Plan:  1. AKI/CKD stage IIIa- presumably ischemic ATN in setting of hemorrhagic shock and ongoing vasopressor support.  Started on IHD 10/24/20 and transitioned to CRRT on 10/26/20 1. CRRT started 10/26/20 2. All fluids 2K/2.5Ca:  Pre-filter 400 ml/hr, post filter 200 ml/hr, dialysate 1000 ml/hr 3. Keep even to + due to low CVP 4. RIJ temp HD cath placed 10/24/20 5. No heparin with CRRT. 6. Will increase pre-filter to 500 ml/hr, post to 300 ml/hr and dialysate to 1500 ml/hr to help with azotemia and prevent clotting.  2. Hemorrhagic shock due to large retroperitoneal bleed seen on CT scan 10/27/20 requiring blood transfusions and vasopressors. 3. Acute hypoxic respiratory failure due to PNA and heart failure.  Extubated 10/17/20 but re-intubated and may need trach. 4. Acute systolic CHF- ECHO with EF 25-30%.  Per heartfailure team combination of septic and cardiogenic shock.   5. Large retroperitoneal bleed with ABLA- transfuse prn.  No heparin 6. Hyperkalemia- resolved with CRRT 7. PAF- off heparin due to RP bleed 8. S/p L5-S1  laminectomy with fusion.  Donetta Potts, MD [employer] [phone]

## 2020-10-30 ENCOUNTER — Inpatient Hospital Stay (HOSPITAL_COMMUNITY): Payer: Medicare Other

## 2020-10-30 LAB — HEPATIC FUNCTION PANEL
ALT: 226 U/L — ABNORMAL HIGH (ref 0–44)
AST: 126 U/L — ABNORMAL HIGH (ref 15–41)
Albumin: 1.7 g/dL — ABNORMAL LOW (ref 3.5–5.0)
Alkaline Phosphatase: 168 U/L — ABNORMAL HIGH (ref 38–126)
Bilirubin, Direct: 0.3 mg/dL — ABNORMAL HIGH (ref 0.0–0.2)
Indirect Bilirubin: 0.7 mg/dL (ref 0.3–0.9)
Total Bilirubin: 1 mg/dL (ref 0.3–1.2)
Total Protein: 5.4 g/dL — ABNORMAL LOW (ref 6.5–8.1)

## 2020-10-30 LAB — CBC WITH DIFFERENTIAL/PLATELET
Abs Immature Granulocytes: 0.24 10*3/uL — ABNORMAL HIGH (ref 0.00–0.07)
Basophils Absolute: 0 10*3/uL (ref 0.0–0.1)
Basophils Relative: 0 %
Eosinophils Absolute: 0.2 10*3/uL (ref 0.0–0.5)
Eosinophils Relative: 1 %
HCT: 26.2 % — ABNORMAL LOW (ref 39.0–52.0)
Hemoglobin: 8.9 g/dL — ABNORMAL LOW (ref 13.0–17.0)
Immature Granulocytes: 1 %
Lymphocytes Relative: 9 %
Lymphs Abs: 1.7 10*3/uL (ref 0.7–4.0)
MCH: 29.9 pg (ref 26.0–34.0)
MCHC: 34 g/dL (ref 30.0–36.0)
MCV: 87.9 fL (ref 80.0–100.0)
Monocytes Absolute: 0.5 10*3/uL (ref 0.1–1.0)
Monocytes Relative: 3 %
Neutro Abs: 15.8 10*3/uL — ABNORMAL HIGH (ref 1.7–7.7)
Neutrophils Relative %: 86 %
Platelets: 176 10*3/uL (ref 150–400)
RBC: 2.98 MIL/uL — ABNORMAL LOW (ref 4.22–5.81)
RDW: 14.2 % (ref 11.5–15.5)
WBC: 18.5 10*3/uL — ABNORMAL HIGH (ref 4.0–10.5)
nRBC: 0.4 % — ABNORMAL HIGH (ref 0.0–0.2)

## 2020-10-30 LAB — RENAL FUNCTION PANEL
Albumin: 1.7 g/dL — ABNORMAL LOW (ref 3.5–5.0)
Albumin: 1.7 g/dL — ABNORMAL LOW (ref 3.5–5.0)
Anion gap: 9 (ref 5–15)
Anion gap: 11 (ref 5–15)
BUN: 47 mg/dL — ABNORMAL HIGH (ref 8–23)
BUN: 41 mg/dL — ABNORMAL HIGH (ref 8–23)
CO2: 24 mmol/L (ref 22–32)
CO2: 23 mmol/L (ref 22–32)
Calcium: 7 mg/dL — ABNORMAL LOW (ref 8.9–10.3)
Calcium: 6.8 mg/dL — ABNORMAL LOW (ref 8.9–10.3)
Chloride: 100 mmol/L (ref 98–111)
Chloride: 98 mmol/L (ref 98–111)
Creatinine, Ser: 1.76 mg/dL — ABNORMAL HIGH (ref 0.61–1.24)
Creatinine, Ser: 1.69 mg/dL — ABNORMAL HIGH (ref 0.61–1.24)
GFR, Estimated: 38 mL/min — ABNORMAL LOW (ref >60)
GFR, Estimated: 40 mL/min — ABNORMAL LOW (ref >60)
Glucose, Bld: 195 mg/dL — ABNORMAL HIGH (ref 70–99)
Glucose, Bld: 167 mg/dL — ABNORMAL HIGH (ref 70–99)
Phosphorus: 4.5 mg/dL (ref 2.5–4.6)
Phosphorus: 4.3 mg/dL (ref 2.5–4.6)
Potassium: 3.6 mmol/L (ref 3.5–5.1)
Potassium: 3.6 mmol/L (ref 3.5–5.1)
Sodium: 133 mmol/L — ABNORMAL LOW (ref 135–145)
Sodium: 132 mmol/L — ABNORMAL LOW (ref 135–145)

## 2020-10-30 LAB — TYPE AND SCREEN
ABO/RH(D): A POS
ABO/RH(D): A POS
Antibody Screen: NEGATIVE
Antibody Screen: NEGATIVE
Unit division: 0
Unit division: 0
Unit division: 0
Unit division: 0
Unit division: 0

## 2020-10-30 LAB — CBC
HCT: 27.3 % — ABNORMAL LOW (ref 39.0–52.0)
HCT: 27.3 % — ABNORMAL LOW (ref 39.0–52.0)
Hemoglobin: 9.2 g/dL — ABNORMAL LOW (ref 13.0–17.0)
Hemoglobin: 9.4 g/dL — ABNORMAL LOW (ref 13.0–17.0)
MCH: 29.8 pg (ref 26.0–34.0)
MCH: 30.3 pg (ref 26.0–34.0)
MCHC: 33.7 g/dL (ref 30.0–36.0)
MCHC: 34.4 g/dL (ref 30.0–36.0)
MCV: 88.3 fL (ref 80.0–100.0)
MCV: 88.1 fL (ref 80.0–100.0)
Platelets: 174 10*3/uL (ref 150–400)
Platelets: 179 10*3/uL (ref 150–400)
RBC: 3.09 MIL/uL — ABNORMAL LOW (ref 4.22–5.81)
RBC: 3.1 MIL/uL — ABNORMAL LOW (ref 4.22–5.81)
RDW: 14.5 % (ref 11.5–15.5)
RDW: 14.3 % (ref 11.5–15.5)
WBC: 24.3 10*3/uL — ABNORMAL HIGH (ref 4.0–10.5)
WBC: 22.8 10*3/uL — ABNORMAL HIGH (ref 4.0–10.5)
nRBC: 0.4 % — ABNORMAL HIGH (ref 0.0–0.2)
nRBC: 0.4 % — ABNORMAL HIGH (ref 0.0–0.2)

## 2020-10-30 LAB — BPAM RBC
Blood Product Expiration Date: 202201082359
Blood Product Expiration Date: 202201132359
Blood Product Expiration Date: 202201142359
Blood Product Expiration Date: 202201152359
Blood Product Expiration Date: 202201162359
ISSUE DATE / TIME: 202112190017
ISSUE DATE / TIME: 202112190017
ISSUE DATE / TIME: 202112190517
ISSUE DATE / TIME: 202112192223
ISSUE DATE / TIME: 202112200021
Unit Type and Rh: 6200
Unit Type and Rh: 6200
Unit Type and Rh: 6200
Unit Type and Rh: 6200
Unit Type and Rh: 6200

## 2020-10-30 LAB — GLUCOSE, CAPILLARY
Glucose-Capillary: 186 mg/dL — ABNORMAL HIGH (ref 70–99)
Glucose-Capillary: 189 mg/dL — ABNORMAL HIGH (ref 70–99)
Glucose-Capillary: 173 mg/dL — ABNORMAL HIGH (ref 70–99)
Glucose-Capillary: 152 mg/dL — ABNORMAL HIGH (ref 70–99)
Glucose-Capillary: 133 mg/dL — ABNORMAL HIGH (ref 70–99)

## 2020-10-30 LAB — PROCALCITONIN: Procalcitonin: 4.85 ng/mL

## 2020-10-30 LAB — MAGNESIUM: Magnesium: 2.4 mg/dL (ref 1.7–2.4)

## 2020-10-30 LAB — PROTIME-INR
INR: 1.4 — ABNORMAL HIGH (ref 0.8–1.2)
Prothrombin Time: 17 seconds — ABNORMAL HIGH (ref 11.4–15.2)

## 2020-10-30 LAB — APTT: aPTT: 35 seconds (ref 24–36)

## 2020-10-30 MED ORDER — SODIUM CHLORIDE 0.9 % IV SOLN
1.0000 g | Freq: Three times a day (TID) | INTRAVENOUS | Status: DC
  Administered 2020-10-30 – 2020-10-31 (×3): 1 g via INTRAVENOUS
  Filled 2020-10-30 (×8): qty 1

## 2020-10-30 MED ORDER — VANCOMYCIN HCL 750 MG/150ML IV SOLN
750.0000 mg | INTRAVENOUS | Status: DC
  Filled 2020-10-30: qty 150

## 2020-10-30 MED ORDER — VANCOMYCIN HCL 1250 MG/250ML IV SOLN
1250.0000 mg | Freq: Once | INTRAVENOUS | Status: AC
  Administered 2020-10-30: 12:00:00 1250 mg via INTRAVENOUS
  Filled 2020-10-30: qty 250

## 2020-10-30 NOTE — Progress Notes (Signed)
Patient continues to require vasopressors for circulatory response.  His recent hemoglobin suggests that active hemorrhage has either slowed or stopped.  He is breathing comfortably on the ventilator.  He is awake and aware and appears to answer some simple questions.  He is tolerating the hemodialysis reasonably well.  No urine output unfortunately.  Patient continues to deal with aftereffects of severe hypovolemic shock secondary to massive retroperitoneal hemorrhage of unknown etiology.  Circulatory parameters are improving somewhat but still he requires vasopressor support.  Kidney failure ongoing.  Pulmonary status surprisingly good.  Patient's family is beginning to set limits of care which I think is appropriate.  There working toward possible extubation tomorrow with palliative support.

## 2020-10-30 NOTE — Progress Notes (Signed)
Nutrition Follow-up  DOCUMENTATION CODES:   Non-severe (moderate) malnutrition in context of chronic illness  INTERVENTION:   Consider starting TPN to meet nutritional needs  NUTRITION DIAGNOSIS:   Moderate Malnutrition related to chronic illness as evidenced by moderate fat depletion,severe muscle depletion,moderate muscle depletion.  Being addressed via TF  GOAL:   Patient will meet greater than or equal to 90% of their needs  Progressing  MONITOR:   TF tolerance,Labs  REASON FOR ASSESSMENT:   Consult Enteral/tube feeding initiation and management  ASSESSMENT:   Pt with PMH of HTN, HLD, stage III CKD, CAD, who recently was admitted 09/29/20 for L5-S1 decompression and fusion and discharged home now readmitted for poorly controlled back and bilateral lower extremity pain and PNA.  12/1 admission 12/4 intubatedpossibly from flash pulmonary edema; OG tube tip in stomach  12/5 TF started per protocol 12/08 Extubated, but required re-intubation. 12/10 TF held; KUB showed ileus; reglan initiated 12/11 TF restarted 12/15 Started HD 12/17 Switched to CRRT 12/18 Developed shock, workup revealed large RP hemorrhage 12/20 TF held due to abd distention  Pt remains on vent support, sedated with precedex gtt, requiring levophed. Per MD notes, pt wanting trach. Code status now DNR  TF currently on hold due to abd distention, +stool via rectal tube, OG with 150 mL out Pt currently on D10 infusion at 50 ml/hr secondary to hypoglycemia,  providing 408 kcals   Current wt 69 kg; admit wt 70 kg. Lowest weight 66 kg. Net negative 2 L per I/O flow sheet  Labs: reviewed Meds: reglan   Diet Order:   Diet Order            Diet NPO time specified  Diet effective now                 EDUCATION NEEDS:   No education needs have been identified at this time  Skin:  Skin Assessment: Reviewed RN Assessment (closed incision to back)  Last BM:  12/21 rectal tube  Height:    Ht Readings from Last 1 Encounters:  10/29/20 5\' 7"  (1.702 m)    Weight:   Wt Readings from Last 1 Encounters:  10/30/20 69.3 kg    Ideal Body Weight:  67.2 kg  BMI:  Body mass index is 23.93 kg/m.  Estimated Nutritional Needs:   Kcal:  2060  Protein:  120-140 g  Fluid:  >1.8 L/day   Kerman Passey MS, RDN, LDN, CNSC Registered Dietitian III Clinical Nutrition RD Pager and On-Call Pager Number Located in [place]

## 2020-10-30 NOTE — Progress Notes (Addendum)
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  12/8 Extubated but required re-intubation.  12/10 Reintubated for hypoxia this morning, increased work of breathing.   Broad-spectrum antibiotics continued.   12/15 started HD 12/17 switched to CVVHD 12/18 Developed shock workup revealed Large RP hemorrhage 12/21 Drop in HGB of 3 grams  Consults:  Neurosurgery Cardiology Renal  Procedures:  10/13/20 Intubation 12/5 bronch 12/15 > R IJ HD cath  Significant Diagnostic Tests:  10/11/20 Chest X-ray, minimal left basilar atelectasis 12/3 LE venous doppler: neg 12/4 echo: LVEF 25-30% 12/10 KUB: Consistent with colonic ileus-gas present down to rectum 12/13 Renal US > mild perinephric fluid, no obstruction. 12/18 head CT >> no acute change CT-PA 12/18 >> no PE, bilateral effusions with associated basilar groundglass/atelectasis, new left retroperitoneal hemorrhage 12/20 CT Abdomen and Pelvis:Large retroperitoneal hematoma on the left as measured above. This has likely increased in size since the patient's prior CT dated 10/27/2020. Detection of active bleeding is not possible on this noncontrast study. Small bilateral pleural effusions with adjacent atelectasis. Coarse bilateral airspace opacities concerning for multifocal pneumonia or aspiration.   Micro Data:  10/18/2020 COVID, Flu neg 12/4:  MRSA PCR neg 12/4 Bronch tracheal aspirate -normal flora 12/4 blood: Negative  Antimicrobials:  Vanc 12/4 > 12/13 Cefepime 12/4 > 12/10 Doxycycline  12/6 > 2/9 Merrem 12/10 > 12/16  Interim history/subjective:  Drop in HGB of 3 grams from 12.8-9.2 overnight 12/21 Stat repeat confirmed with results of 9.4 ( was 9.2 this am)  Remains on CVVHD>> Keeping even to positive Creatinine is 1.76 from 2.16 Net + 2200 cc's, No Urine output last 24 hours. Remains on Levo at 12 mcg per minute WBC has continued to climb overnight to 24.3,  He is afebrile,  Currently off antimicrobial therapy Significant increase in secretions overnight.  PCT 4.85 CVP 6   Objective   Blood pressure (!) 162/52, pulse 71, temperature (!) 97.3 F (36.3 C), temperature source Oral, resp. rate 13, height 5\' 7"  (1.702 m), weight 69.3 kg, SpO2 100 %. CVP:  [0 mmHg-10 mmHg] 2 mmHg  Vent Mode: CPAP;PSV FiO2 (%):  [40 %] 40 % Set Rate:  [28 bmp] 28 bmp Vt Set:  [520 mL] 520 mL PEEP:  [5 cmH20] 5 cmH20 Pressure Support:  [5 cmH20-15 cmH20] 5 cmH20 Plateau Pressure:  [18 cmH20] 18 cmH20   Intake/Output Summary (Last 24 hours) at 10/30/2020 1028 Last data filed at 10/30/2020 1000 Gross per 24 hour  Intake 2091.42 ml  Output 1332 ml  Net 759.42 ml   Filed Weights   10/28/20 0500 10/29/20 0500 10/30/20 0500  Weight: 68.9 kg 69.9 kg 69.3 kg   Physical Exam: General: Chronically ill appearing very deconditioned elderly male lying in bed intubated on CVVHD, no sedation, in no acute distress  HEENT: ETT, MM pink/moist, PERRL,  Neuro: Eyes open and appears to understand communication, unable to follow commands, appears very weak, nods to questions CV: s1s2 regular rate and rhythm, no murmur, rubs, or gallops,  PULM:Bilateral chest excursion,  Coarse throughout anteriorly, tolerating  CPAP/ PS well ( 40%, 5/5) , no acute distress GI: Taunt, bowel sounds hypoactive in all 4 quadrants, tender to palpation, right side  distended, tube feeds placed on hold Extremities: warm/dry, no LE edema, + upper extremity edema  Skin: no rashes or lesions   Resolved problems:   Hypernatremia Hyperkalemia Adynamic ileus A.flutter with RVR  Assessment & Plan:   L5-S1 DJD s/p laminectomy/decompression P: Postop care per neurosurgery  Supportive care   Acute hypoxic respiratory failure due to acute pulm edema and likely aspiration pneumonitis vs HCAP (s/p 10 days vanc and 7 days merrem). Tracheal aspirate 12/20 with moderate enterococcus Faaecalis  P: Continue ventilator support with lung protective strategies  Continue to correct underlying encephalopathy, this remains main barrier to extubation  Wean PEEP and FiO2 for sats greater than 90%. Head of bed elevated 30 degrees. Plateau pressures less than 30 cm H20.  Follow intermittent chest x-ray and ABG.   SAT/SBT as tolerated, mentation preclude extubation  Ensure adequate pulmonary hygiene  Follow cultures  VAP bundle in place  PAD protocol Antibiotic resumed 10/30/2020 for increased secretions, increased WBC and + GS on Tracheal aspirate Given prolonged clinical course and profound deconditioning patient is likely going to need prolonged ventilator support.  This was discussed with daughter at bedside 12/20.  Discussed with Dr. Haroldine Laws 12/21 and patient stated clear desire for trach, but unsure if he understood long term potential.   Continued shock -Initially presented with signs of mentation of septic and cardiogenic shock however now there is also a component of hemorrhagic shock given spontaneous retroperitoneal bleed Acute left retroperitoneal bleed which appears to be enlarging with drop in HGB from 12 to 9 overnight 12/21 Remains on pressors P: Supportive care Echo as above Status post prolonged broad-spectrum antibiotic regiment>> restarted 12/21 for worsening CXR and increased secretions Remains on vasopressors, down titrating Follow culture data currently remains negative Follow CBC Transfuse per protocol Hemoglobin goal greater than 8  Acute metabolic encephalopathy, acutely worse  12/18 in the setting of hypotension.  Repeat CT head reassuring P: Minimize sedation as able  Correct underlying metabolic issues  Close monitoring of hemodynamics to ensure adequate perfusion   AKI due to ATN -Obstruction was ruled out by negative venous ultrasound P: Nephrology consulted, assistance Remains on CRRT since 12/17 currently running fluid positive Follow urine output, discontinue Foley catheter today in favor of condom cath Follow BMP Renal dose medications  Acute systolic congestive heart failure -Right heart cath 12/6 with low filling pressures P: Heart failure team following, appreciate assistance Echo as above Goal-directed medical therapy once able Consider coronary angiograph once medically stabilized  Type 2 diabetes, well controlled -Blood sugar was well controlled on sliding scale insulin, Levemir, and to be coverage however tube feeds have been stopped due to questionable developing ileus has now mildly hypoglycemic P: Long-acting insulin Hold to be coverage Begin dose dextrose infusion CBGs every 4 hours  Anemia of acute illness and now due to retroperitoneal bleed 12/18, Drop in HGB over night from 12 to 9>> Confirmed on repeat Remains on pressors to maintain MAP P: Follow CBC as above Will check INR and LFT's Transfusion protocol as above Consider CTA to isolate site of bleed with IR follow up to stop bleed  Generalized deconditioning. P: PT/OT efforts as able  Ileus -Review it appears patient has been intermittently experiencing an ileus.  This was initially treated with bowel rest and Reglan and had resolved.  Most recent bowel movement reported 12/19  P: Morning of 12/20 abdomen is quite distended with significantly hypoactive bowel sounds Tube feeds placed on hold Ileus likely developing in the setting of retroperitoneal bleed Place OG to wall suction>> 150 cc out last 24 hours  Best practice (evaluated daily)  Diet:  TF Pain/Anxiety/Delirium protocol (if indicated): Hold all sedation. VAP protocol (if indicated): ordered DVT prophylaxis: Subcu heparin GI prophylaxis: PPI Glucose control: SSI, levemir Mobility: BR Code Status: Full Communication: Daughter updated at bedside 12/20 Disposition: ICU   Discussion: Patient with drop in HGB overnight x 3 grams. Concern for continued retroperitoneal bleed.Remains on pressors. Dr. Haroldine Laws spoke with patient today, whop confirmed he would want a trach, but unsure if he understood potential for long tern consequences. Now if family want aggressive care he needs CTA to isolate bleed and procedure in IR to embolize, however, the dye load could  be detrimental to already failed renal function requiring  long term  HD.  Additionally his WBC has climbed overnight and secretions have increased , CXR shows Diffuse bilateral interstitial infiltrates , some improved aeration in right base. CT yesterday was concerning for multifocal pneumonia. Antibiotics have been resumed. Discussed these issues with patient's wife, and how long term care could potentially look for patient moving forward. Family  would like the patient to make the decision regarding how aggressive care should be  moving forward.  Dr. Ruthann Cancer to speak with patient today to determine what pt  Wants in regard to goals of care.Plan of care will follow based on this conversation.   Goals of Care:  Last date of multidisciplinary goals of care discussion: Individual family members have been updated on bedside rounding daily.  Formal goals of care discussion pending.  Will discuss with family today regarding possibility of meeting for formal goals of care discussion Family and staff present: Pending Summary of discussion: Pending Follow up goals of care discussion due: 12/21 Code Status: Full  Labs   CBC: Recent Labs  Lab 10/27/20 2018 10/27/20 2111 10/28/20 1004 10/28/20 1708 10/28/20 2007  10/29/20 0418 10/30/20 0339  WBC 13.0*   < > 17.2* 18.8* 19.9* 22.5* 24.3*  NEUTROABS 11.2*  --   --   --   --   --   --   HGB 7.1*   < > 10.5* 9.5* 9.3* 12.8*  12.2* 9.2*  HCT 23.5*   < > 31.0* 27.7* 27.2* 36.9*  36.0* 27.3*  MCV 93.6   < > 85.6 85.5 85.5 85.6 88.3  PLT 262   < > 173 162 170 164 174   < > = values in this interval not displayed.    Basic Metabolic Panel: Recent Labs  Lab 10/25/20 0422 10/26/20 0456 10/27/20 0355 10/27/20 1620 10/28/20 0328 10/28/20 1404 10/29/20 0418 10/29/20 1815 10/30/20 0339  NA 146*   < > 139   < > 133* 135 134*  136 133* 133*  K 4.2   < > 4.5   < > 6.0* 5.1 4.8  4.7 4.3 3.6  CL 107   < > 102   < > 101 103 102 100 100  CO2 24   < > 22   < > 21* 20* 20* 21* 24  GLUCOSE 231*   < > 134*   < > 180* 176* 206* 99 195*  BUN 97*   < > 86*   < > 83* 69* 65* 60* 47*  CREATININE 3.23*   < > 3.03*   < > 2.60* 2.08* 1.94* 2.16*  1.76*  CALCIUM 7.7*   < > 7.7*   < > 7.2* 7.2* 7.1* 7.0* 7.0*  MG 2.5*  --  2.4  --  2.5*  --  2.8*  --  2.4  PHOS 6.5*  --  8.2*   < > 9.2* 6.8* 5.5* 4.9* 4.5   < > = values in this interval not displayed.   GFR: Estimated Creatinine Clearance: 29.7 mL/min (A) (by C-G formula based on SCr of 1.76 mg/dL (H)). Recent Labs  Lab 10/27/20 2320 10/28/20 0328 10/28/20 1004 10/28/20 1708 10/28/20 2007 10/29/20 0418 10/29/20 1003 10/29/20 1114 10/29/20 1241 10/30/20 0339  PROCALCITON  --   --   --   --   --   --   --  7.98  --  4.85  WBC  --  17.5*   < > 18.8* 19.9* 22.5*  --   --   --  24.3*  LATICACIDVEN 1.5 1.4  --   --   --   --  1.4  --  1.6  --    < > = values in this interval not displayed.    Liver Function Tests: Recent Labs  Lab 10/27/20 2018 10/28/20 0328 10/28/20 1152 10/28/20 1404 10/29/20 0418 10/29/20 1815 10/30/20 0339  AST 324*  --  203*  --   --   --   --   ALT 552*  --  388*  --   --   --   --   ALKPHOS 246*  --  225*  --   --   --   --   BILITOT 0.9  --  1.0  --   --   --   --    PROT 6.0*  --  6.0*  --   --   --   --   ALBUMIN 2.0*   < > 2.0* 1.9* 2.0* 1.9* 1.7*   < > = values in this interval not displayed.   No results for input(s): LIPASE, AMYLASE in the last 168 hours. Recent Labs  Lab 10/28/20 1152  AMMONIA 35    ABG    Component Value Date/Time   PHART 7.452 (H) 10/29/2020 0418   PCO2ART 32.7 10/29/2020 0418   PO2ART 142 (H) 10/29/2020 0418   HCO3 22.9 10/29/2020 0418   TCO2 24 10/29/2020 0418   ACIDBASEDEF 1.0 10/27/2020 2111   O2SAT 99.0 10/29/2020 0418     Coagulation Profile: Recent Labs  Lab 10/27/20 2320  INR 1.4*    Cardiac Enzymes: No results for input(s): CKTOTAL, CKMB, CKMBINDEX, TROPONINI in the last 168 hours.  HbA1C: Hgb A1c MFr Bld  Date/Time Value Ref Range Status  10/12/2020 04:10 PM 7.7 (H) 4.8 - 5.6 % Final    Comment:    (NOTE) Pre diabetes:          5.7%-6.4%  Diabetes:              >6.4%  Glycemic control for   <7.0% adults with diabetes     CBG: Recent Labs  Lab 10/29/20 2004 10/29/20 2153 10/29/20 2342 10/30/20 0343 10/30/20 0734  GLUCAP 67* 149* 156* 186* 189*      Critical care time:    Performed by: Magdalen Spatz  Total critical care time: 45 minutes  Critical care time was exclusive of separately billable procedures and treating other patients.  Critical care was necessary to treat or prevent imminent or life-threatening deterioration.  Critical care was time spent personally by me on  the following activities: development of treatment plan with patient and/or surrogate as well as nursing, discussions with consultants, evaluation of patient's response to treatment, examination of patient, obtaining history from patient or surrogate, ordering and performing treatments and interventions, ordering and review of laboratory studies, ordering and review of radiographic studies, pulse oximetry and re-evaluation of patient's condition.   Magdalen Spatz, MSN, AGACNP-BC [place] for personal pager PCCM on call pager [phone] 10/30/2020, 10:28 AM

## 2020-10-30 NOTE — Progress Notes (Signed)
Patient ID: Dennis Carey, male   DOB: [birth year], 85 y.o.   MRN: [id number] S:Pt was seen and examined while on CRRT.  Family meeting noted, pt is DNR but will continue with current level of care. O:BP (!) 162/52   Pulse 70   Temp (!) 97.3 F (36.3 C) (Oral)   Resp 12   Ht _0  (1.702 m)   Wt 69.3 kg   SpO2 100%   BMI 23.93 kg/m   Intake/Output Summary (Last 24 hours) at 10/30/2020 0918 Last data filed at 10/30/2020 0900 Gross per 24 hour  Intake 2263.37 ml  Output 1266 ml  Net 997.37 ml   Intake/Output: I/O last 3 completed shifts: In: 5013.9 [I.V.:2993.9; Blood:630; NG/GT:1390] Out: 2585 [Urine:8; Emesis/NG output:150; Other:1329; Stool:300; Blood:37]  Intake/Output this shift:  Total I/O In: 127.4 [I.V.:127.4] Out: 81 [Other:81] Weight change: -0.6 kg Gen: intubated but awake and alert, no complaints CVS: RRR, no rub Resp: scattered rhonchi Abd: +BS, soft, NT/ND Ext: trace pretibial edema bilaterally  Recent Labs  Lab 10/27/20 0355 10/27/20 1620 10/27/20 1948 10/27/20 2018 10/27/20 2111 10/28/20 0328 10/28/20 1152 10/28/20 1404 10/29/20 0418 10/29/20 1815 10/30/20 0339  NA 139 136   < > 134* 133* 133*  --  135 134*  136 133* 133*  K 4.5 5.3*   < > 5.9* 5.9* 6.0*  --  5.1 4.8  4.7 4.3 3.6  CL 102 101  --  101  --  101  --  103 102 100 100  CO2 22 25  --  23  --  21*  --  20* 20* 21* 24  GLUCOSE 134* 182*  --  273*  --  180*  --  176* 206* 99 195*  BUN 86* 68*  --  66*  --  83*  --  69* 65* 60* 47*  CREATININE 3.03* 2.36*  --  2.35*  --  2.60*  --  2.08* 1.94* 2.16* 1.76*  ALBUMIN 2.1* 2.2*  --  2.0*  --  2.0* 2.0* 1.9* 2.0* 1.9* 1.7*  CALCIUM 7.7* 7.8*  --  7.3*  --  7.2*  --  7.2* 7.1* 7.0* 7.0*  PHOS 8.2* 5.7*  --   --   --  9.2*  --  6.8* 5.5* 4.9* 4.5  AST  --   --   --  324*  --   --  203*  --   --   --   --   ALT  --   --   --  552*  --   --  388*  --   --   --   --    < > = values in this interval not displayed.   Liver Function  Tests: Recent Labs  Lab 10/27/20 2018 10/28/20 0328 10/28/20 1152 10/28/20 1404 10/29/20 0418 10/29/20 1815 10/30/20 0339  AST 324*  --  203*  --   --   --   --   ALT 552*  --  388*  --   --   --   --   ALKPHOS 246*  --  225*  --   --   --   --   BILITOT 0.9  --  1.0  --   --   --   --   PROT 6.0*  --  6.0*  --   --   --   --   ALBUMIN 2.0*   < > 2.0*   < > 2.0* 1.9* 1.7*   < > =  values in this interval not displayed.   No results for input(s): LIPASE, AMYLASE in the last 168 hours. Recent Labs  Lab 10/28/20 1152  AMMONIA 35   CBC: Recent Labs  Lab 10/27/20 2018 10/27/20 2111 10/28/20 1004 10/28/20 1708 10/28/20 2007 10/29/20 0418 10/30/20 0339  WBC 13.0*   < > 17.2* 18.8* 19.9* 22.5* 24.3*  NEUTROABS 11.2*  --   --   --   --   --   --   HGB 7.1*   < > 10.5* 9.5* 9.3* 12.8*  12.2* 9.2*  HCT 23.5*   < > 31.0* 27.7* 27.2* 36.9*  36.0* 27.3*  MCV 93.6   < > 85.6 85.5 85.5 85.6 88.3  PLT 262   < > 173 162 170 164 174   < > = values in this interval not displayed.   Cardiac Enzymes: No results for input(s): CKTOTAL, CKMB, CKMBINDEX, TROPONINI in the last 168 hours. CBG: Recent Labs  Lab 10/29/20 2004 10/29/20 2153 10/29/20 2342 10/30/20 0343 10/30/20 0734  GLUCAP 67* 149* 156* 186* 189*    Iron Studies: No results for input(s): IRON, TIBC, TRANSFERRIN, FERRITIN in the last 72 hours. Studies/Results: CT ABDOMEN PELVIS WO CONTRAST  Addendum Date: 10/29/2020   ADDENDUM REPORT: 10/29/2020 16:29 ADDENDUM: These results were called by telephone at the time of interpretation on 10/29/2020 at 4:29 pm to provider [facility] DAVIS , who verbally acknowledged these results. Electronically Signed   By: Constance Holster M.D.   On: 10/29/2020 16:29   Result Date: 10/29/2020 CLINICAL DATA:  Abdominal distension. Concern for retroperitoneal bleed. EXAM: CT ABDOMEN AND PELVIS WITHOUT CONTRAST TECHNIQUE: Multidetector CT imaging of the abdomen and pelvis was performed  following the standard protocol without IV contrast. COMPARISON:  CT dated 10/27/2020 FINDINGS: Lower chest: There are small bilateral pleural effusions. There is atelectasis at the lung bases. Coarse bilateral airspace opacities are noted.The heart size is normal. Hepatobiliary: The liver is normal. Normal gallbladder.There is no biliary ductal dilation. Pancreas: Normal contours without ductal dilatation. No peripancreatic fluid collection. Spleen: Unremarkable. Adrenals/Urinary Tract: --Adrenal glands: Unremarkable. --Right kidney/ureter: No hydronephrosis or radiopaque kidney stones. --Left kidney/ureter: Left kidney is displaced anteriorly. Otherwise, the left kidney is unremarkable. --Urinary bladder: Unremarkable. Stomach/Bowel: --Stomach/Duodenum: No hiatal hernia or other gastric abnormality. Normal duodenal course and caliber. --Small bowel: Unremarkable. --Colon: A rectal tube is noted. There are scattered colonic diverticula without CT evidence for diverticulitis. --Appendix: Normal. Vascular/Lymphatic: Atherosclerotic calcification is present within the non-aneurysmal abdominal aorta, without hemodynamically significant stenosis. --No retroperitoneal lymphadenopathy. --No mesenteric lymphadenopathy. --No pelvic or inguinal lymphadenopathy. Reproductive: Unremarkable Other: There is a small amount of free fluid in the abdomen pelvis. No free air. There is a large retroperitoneal hematoma on the left measuring approximately 10 by 11 by 15 cm. This retroperitoneal hematoma has likely increased in size since the patient's recent CT. Detection of active extravasation is not possible on this study secondary to lack of IV contrast. Musculoskeletal. There are degenerative changes of the lumbar spine. The patient is status post prior posterior fusion from L5-S1 with an interbody spacer at the L5-S1 level. IMPRESSION: 1. Large retroperitoneal hematoma on the left as measured above. This has likely increased in  size since the patient's prior CT dated 10/27/2020. Detection of active bleeding is not possible on this noncontrast study. 2. Small bilateral pleural effusions with adjacent atelectasis. 3. Coarse bilateral airspace opacities concerning for multifocal pneumonia or aspiration. Aortic Atherosclerosis (ICD10-I70.0). Electronically Signed: By: Constance Holster M.D. On:  10/29/2020 16:13   DG CHEST PORT 1 VIEW  Result Date: 10/30/2020 CLINICAL DATA:  Intubation.  Respiratory failure. EXAM: PORTABLE CHEST 1 VIEW COMPARISON:  Chest x-ray 10/29/2020.  CT 10/27/2020. FINDINGS: Endotracheal tube, NG tube, right IJ dual-lumen catheter, left PICC line in stable position. Heart size stable. Diffuse bilateral interstitial infiltrates again noted. Slight improvement in aeration in the right lung base. No prominent pleural effusion. No pneumothorax. IMPRESSION: 1. Lines and tubes in stable position. 2. Diffuse bilateral interstitial infiltrates again noted. Slight improvement in aeration in the right lung base. Electronically Signed   By: Marcello Moores  Register   On: 10/30/2020 06:54   DG CHEST PORT 1 VIEW  Result Date: 10/29/2020 CLINICAL DATA:  Hypoxia EXAM: PORTABLE CHEST 1 VIEW COMPARISON:  October 27, 2020 FINDINGS: Endotracheal tube tip is 3.2 cm above the carina. Nasogastric tube tip is below the diaphragm with side port at the gastroesophageal junction. Left peripherally inserted central catheter tip is in the superior vena cava. Right jugular catheter tip is in the superior vena cava. No pneumothorax. There is ill-defined opacity in the right base. Lungs elsewhere are clear. Heart size and pulmonary vascularity are normal. No adenopathy. No bone lesions. IMPRESSION: Tube and catheter positions as described without pneumothorax. Note that the nasogastric tube side port is at the gastroesophageal junction. It may be prudent to advance the nasogastric tube 4-5 cm. Ill-defined airspace opacity right base consistent with  pneumonia. Lungs elsewhere clear. Cardiac silhouette within normal limits. Electronically Signed   By: Lowella Grip III M.D.   On: 10/29/2020 10:04   . sodium chloride   Intravenous Once  . amiodarone  200 mg Per Tube BID  . bethanechol  10 mg Per Tube TID  . chlorhexidine gluconate (MEDLINE KIT)  15 mL Mouth Rinse BID  . Chlorhexidine Gluconate Cloth  6 each Topical Daily  . docusate  100 mg Per Tube BID  . ezetimibe  10 mg Per Tube Daily  . feeding supplement (PROSource TF)  45 mL Per Tube BID  . free water  100 mL Per Tube Q4H  . mouth rinse  15 mL Mouth Rinse 10 times per day  . metoCLOPramide (REGLAN) injection  5 mg Intravenous Q8H  . pantoprazole sodium  40 mg Per Tube Q1200  . polyethylene glycol  17 g Per Tube Daily  . rosuvastatin  20 mg Per Tube Daily  . sodium chloride flush  10-40 mL Intracatheter Q12H  . sodium chloride flush  10-40 mL Intracatheter Q12H    BMET    Component Value Date/Time   NA 133 (L) 10/30/2020 0339   K 3.6 10/30/2020 0339   CL 100 10/30/2020 0339   CO2 24 10/30/2020 0339   GLUCOSE 195 (H) 10/30/2020 0339   BUN 47 (H) 10/30/2020 0339   CREATININE 1.76 (H) 10/30/2020 0339   CALCIUM 7.0 (L) 10/30/2020 0339   GFRNONAA 38 (L) 10/30/2020 0339   GFRAA 58 (L) 06/08/2020 0647   CBC    Component Value Date/Time   WBC 24.3 (H) 10/30/2020 0339   RBC 3.09 (L) 10/30/2020 0339   HGB 9.2 (L) 10/30/2020 0339   HCT 27.3 (L) 10/30/2020 0339   PLT 174 10/30/2020 0339   MCV 88.3 10/30/2020 0339   MCH 29.8 10/30/2020 0339   MCHC 33.7 10/30/2020 0339   RDW 14.5 10/30/2020 0339   LYMPHSABS 1.0 10/27/2020 2018   MONOABS 0.5 10/27/2020 2018   EOSABS 0.1 10/27/2020 2018   BASOSABS 0.0 10/27/2020 2018  Assessment/Plan:  1. AKI/CKD stage IIIa- presumably ischemic ATN in setting of hemorrhagic shock and ongoing vasopressor support.  Started on IHD 10/24/20 and transitioned to CRRT on 10/26/20 1. CRRT started 10/26/20 2. All fluids 2K/2.5Ca:   Pre-filter 500 ml/hr, post filter 300 ml/hr, dialysate 1500 ml/hr 3. Keep even to + due to low CVP 4. RIJ temp HD cath placed 10/24/20 5. No heparin with CRRT. 6. Continue with CRRT while he remains on pressors. 2. Hemorrhagic shock due to large retroperitoneal bleed seen on CT scan 10/27/20 requiring blood transfusions and vasopressors.  Still on levophed 3. Acute hypoxic respiratory failure due to PNA and heart failure.  Extubated 10/17/20 but re-intubated and may need trach. 1. Will likely require trach towards the end of the week. 4. Acute systolic CHF- ECHO with EF 25-30%.  Per heartfailure team combination of septic and cardiogenic shock.   5. Large retroperitoneal bleed with ABLA- transfuse prn.  No heparin 6. Hyperkalemia- resolved with CRRT 7. PAF- off heparin due to RP bleed 8. S/p L5-S1 laminectomy with fusion. Donetta Potts, MD [employer] [phone]

## 2020-10-30 NOTE — Progress Notes (Signed)
Pharmacy Antibiotic Note  Dennis Carey is a 85 y.o. male admitted on 12-Oct-2020 s/p L5-S1 decompression and fusion 10 days prior. Hospital course c/b RP hemorrhage and renal failure. Pt previously completed course of vancomycin and meropenem, now to resume with rising WBC. Pt is currently on CRRT.  Plan: Meropenem 1g q8h Vancomycin 1250mg  x1 then 750mg  q24h Follow RRT plans, cultures, LOT Vancomycin levels as needed  Height: 5\' 7"  (170.2 cm) Weight: 69.3 kg (152 lb 12.5 oz) IBW/kg (Calculated) : 66.1  Temp (24hrs), Avg:97.8 F (36.6 C), Min:95.9 F (35.5 C), Max:99.4 F (37.4 C)  Recent Labs  Lab 10/27/20 2018 10/27/20 2320 10/28/20 0328 10/28/20 1004 10/28/20 1404 10/28/20 1708 10/28/20 2007 10/29/20 0418 10/29/20 1003 10/29/20 1241 10/29/20 1815 10/30/20 0339  WBC 13.0*  --  17.5* 17.2*  --  18.8* 19.9* 22.5*  --   --   --  24.3*  CREATININE 2.35*  --  2.60*  --  2.08*  --   --  1.94*  --   --  2.16* 1.76*  LATICACIDVEN 2.0* 1.5 1.4  --   --   --   --   --  1.4 1.6  --   --     Estimated Creatinine Clearance: 29.7 mL/min (A) (by C-G formula based on SCr of 1.76 mg/dL (H)).    Allergies  Allergen Reactions  . Sulfa Antibiotics Hives    Antimicrobials this admission: Vanc x1 12/4; Resumed 12/8> 12/13; 12/21 >> Cefepime 12/4 >>12/10 Doxy 12/6>12/10 Meropenem 12/10>>12/16; 12/21 >>  Microbiology results: 12/4 MRSA PCR - negative 12/4 BCx - ng 12/4 TA -  mod GPC+ GNR, abund WBC (reicnubated)>nf 12/5 TA - nf 12/7 TA - normal flora 12/10 cdiff neg 12/20 BCx: NGTD  Thank you for allowing pharmacy to be a part of this patient's care. Arrie Senate, PharmD, BCPS, [facility] Clinical Pharmacist [phone] Please check AMION for all [place] numbers 10/30/2020

## 2020-10-30 NOTE — Progress Notes (Signed)
Patient ID: Dennis Carey, male   DOB: 1935/09/30, 85 y.o.   MRN: [id number]     Advanced Heart Failure Rounding Note   Subjective:    12/7 Bronch with copious secretions that were removed.  12/8 Extubated but required re-intubation.  12/15 started HD 12/17 switched to CVVHD 12/18 Large RP hemorrhage. Transfused  On NE at 12 for BP support. Remains on CVVHD. Hgb 12.8 -> 9.2. Remains off abx. PCT falling. WBC still climbing. Tm 99.4 BCx 12/20 NGTD. More secretions in ETT.   CVP 6  Sputum cx MODERATE GRAM POSITIVE COCCI IN PAIRS IN CHAINS  FEW YEAST  RARE GRAM NEGATIVE COCCOBACILLI   Echo 12/18 EF back to 55-60%   Objective:   Weight Range:  Vital Signs:   Temp:  [95.9 F (35.5 C)-99.4 F (37.4 C)] 97.3 F (36.3 C) (12/21 0800) Pulse Rate:  [59-100] 70 (12/21 0900) Resp:  [12-36] 12 (12/21 0900) BP: (120-162)/(51-55) 162/52 (12/21 0806) SpO2:  [96 %-100 %] 100 % (12/21 0900) Arterial Line BP: (90-165)/(42-63) 151/48 (12/21 0900) FiO2 (%):  [40 %] 40 % (12/21 0806) Weight:  [69.3 kg] 69.3 kg (12/21 0500) Last BM Date: 10/30/20  Weight change: Filed Weights   10/28/20 0500 10/29/20 0500 10/30/20 0500  Weight: 68.9 kg 69.9 kg 69.3 kg    Intake/Output:   Intake/Output Summary (Last 24 hours) at 10/30/2020 0938 Last data filed at 10/30/2020 0900 Gross per 24 hour  Intake 2263.37 ml  Output 1266 ml  Net 997.37 ml     PHYSICAL EXAM: General: Awake on vent. Following commands   HEENT: normal Neck: supple. no JVD. Carotids 2+ bilat; no bruits. No lymphadenopathy or thryomegaly appreciated. Cor: PMI nondisplaced. Regular rate & rhythm. No rubs, gallops or murmurs. Lungs: clear Abdomen: soft, mildly tender, ++ distended. No hepatosplenomegaly. No bruits or masses. Good bowel sounds. Extremities: no cyanosis, clubbing, rash, edema Neuro: awake on vent follows commands   Telemetry: Sinus 70-80 Personally reviewed  Labs: Basic Metabolic Panel: Recent Labs   Lab 10/25/20 0422 10/26/20 0456 10/27/20 0355 10/27/20 1620 10/28/20 0328 10/28/20 1404 10/29/20 0418 10/29/20 1815 10/30/20 0339  NA 146*   < > 139   < > 133* 135 134*  136 133* 133*  K 4.2   < > 4.5   < > 6.0* 5.1 4.8  4.7 4.3 3.6  CL 107   < > 102   < > 101 103 102 100 100  CO2 24   < > 22   < > 21* 20* 20* 21* 24  GLUCOSE 231*   < > 134*   < > 180* 176* 206* 99 195*  BUN 97*   < > 86*   < > 83* 69* 65* 60* 47*  CREATININE 3.23*   < > 3.03*   < > 2.60* 2.08* 1.94* 2.16* 1.76*  CALCIUM 7.7*   < > 7.7*   < > 7.2* 7.2* 7.1* 7.0* 7.0*  MG 2.5*  --  2.4  --  2.5*  --  2.8*  --  2.4  PHOS 6.5*  --  8.2*   < > 9.2* 6.8* 5.5* 4.9* 4.5   < > = values in this interval not displayed.    Liver Function Tests: Recent Labs  Lab 10/27/20 2018 10/28/20 0328 10/28/20 1152 10/28/20 1404 10/29/20 0418 10/29/20 1815 10/30/20 0339  AST 324*  --  203*  --   --   --   --   ALT 552*  --  388*  --   --   --   --   ALKPHOS 246*  --  225*  --   --   --   --   BILITOT 0.9  --  1.0  --   --   --   --   PROT 6.0*  --  6.0*  --   --   --   --   ALBUMIN 2.0*   < > 2.0* 1.9* 2.0* 1.9* 1.7*   < > = values in this interval not displayed.   No results for input(s): LIPASE, AMYLASE in the last 168 hours. Recent Labs  Lab 10/28/20 1152  AMMONIA 35    CBC: Recent Labs  Lab 10/27/20 2018 10/27/20 2111 10/28/20 1004 10/28/20 1708 10/28/20 2007 10/29/20 0418 10/30/20 0339  WBC 13.0*   < > 17.2* 18.8* 19.9* 22.5* 24.3*  NEUTROABS 11.2*  --   --   --   --   --   --   HGB 7.1*   < > 10.5* 9.5* 9.3* 12.8*  12.2* 9.2*  HCT 23.5*   < > 31.0* 27.7* 27.2* 36.9*  36.0* 27.3*  MCV 93.6   < > 85.6 85.5 85.5 85.6 88.3  PLT 262   < > 173 162 170 164 174   < > = values in this interval not displayed.    Cardiac Enzymes: No results for input(s): CKTOTAL, CKMB, CKMBINDEX, TROPONINI in the last 168 hours.  BNP: BNP (last 3 results) Recent Labs    10/13/20 1513  BNP 589.0*    ProBNP (last  3 results) No results for input(s): PROBNP in the last 8760 hours.    Other results:  Imaging: CT ABDOMEN PELVIS WO CONTRAST  Addendum Date: 10/29/2020   ADDENDUM REPORT: 10/29/2020 16:29 ADDENDUM: These results were called by telephone at the time of interpretation on 10/29/2020 at 4:29 pm to provider [facility] DAVIS , who verbally acknowledged these results. Electronically Signed   By: Constance Holster M.D.   On: 10/29/2020 16:29   Result Date: 10/29/2020 CLINICAL DATA:  Abdominal distension. Concern for retroperitoneal bleed. EXAM: CT ABDOMEN AND PELVIS WITHOUT CONTRAST TECHNIQUE: Multidetector CT imaging of the abdomen and pelvis was performed following the standard protocol without IV contrast. COMPARISON:  CT dated 10/27/2020 FINDINGS: Lower chest: There are small bilateral pleural effusions. There is atelectasis at the lung bases. Coarse bilateral airspace opacities are noted.The heart size is normal. Hepatobiliary: The liver is normal. Normal gallbladder.There is no biliary ductal dilation. Pancreas: Normal contours without ductal dilatation. No peripancreatic fluid collection. Spleen: Unremarkable. Adrenals/Urinary Tract: --Adrenal glands: Unremarkable. --Right kidney/ureter: No hydronephrosis or radiopaque kidney stones. --Left kidney/ureter: Left kidney is displaced anteriorly. Otherwise, the left kidney is unremarkable. --Urinary bladder: Unremarkable. Stomach/Bowel: --Stomach/Duodenum: No hiatal hernia or other gastric abnormality. Normal duodenal course and caliber. --Small bowel: Unremarkable. --Colon: A rectal tube is noted. There are scattered colonic diverticula without CT evidence for diverticulitis. --Appendix: Normal. Vascular/Lymphatic: Atherosclerotic calcification is present within the non-aneurysmal abdominal aorta, without hemodynamically significant stenosis. --No retroperitoneal lymphadenopathy. --No mesenteric lymphadenopathy. --No pelvic or inguinal lymphadenopathy.  Reproductive: Unremarkable Other: There is a small amount of free fluid in the abdomen pelvis. No free air. There is a large retroperitoneal hematoma on the left measuring approximately 10 by 11 by 15 cm. This retroperitoneal hematoma has likely increased in size since the patient's recent CT. Detection of active extravasation is not possible on this study secondary to lack of IV contrast. Musculoskeletal. There are degenerative  changes of the lumbar spine. The patient is status post prior posterior fusion from L5-S1 with an interbody spacer at the L5-S1 level. IMPRESSION: 1. Large retroperitoneal hematoma on the left as measured above. This has likely increased in size since the patient's prior CT dated 10/27/2020. Detection of active bleeding is not possible on this noncontrast study. 2. Small bilateral pleural effusions with adjacent atelectasis. 3. Coarse bilateral airspace opacities concerning for multifocal pneumonia or aspiration. Aortic Atherosclerosis (ICD10-I70.0). Electronically Signed: By: Constance Holster M.D. On: 10/29/2020 16:13   DG CHEST PORT 1 VIEW  Result Date: 10/30/2020 CLINICAL DATA:  Intubation.  Respiratory failure. EXAM: PORTABLE CHEST 1 VIEW COMPARISON:  Chest x-ray 10/29/2020.  CT 10/27/2020. FINDINGS: Endotracheal tube, NG tube, right IJ dual-lumen catheter, left PICC line in stable position. Heart size stable. Diffuse bilateral interstitial infiltrates again noted. Slight improvement in aeration in the right lung base. No prominent pleural effusion. No pneumothorax. IMPRESSION: 1. Lines and tubes in stable position. 2. Diffuse bilateral interstitial infiltrates again noted. Slight improvement in aeration in the right lung base. Electronically Signed   By: Marcello Moores  Register   On: 10/30/2020 06:54   DG CHEST PORT 1 VIEW  Result Date: 10/29/2020 CLINICAL DATA:  Hypoxia EXAM: PORTABLE CHEST 1 VIEW COMPARISON:  October 27, 2020 FINDINGS: Endotracheal tube tip is 3.2 cm above the  carina. Nasogastric tube tip is below the diaphragm with side port at the gastroesophageal junction. Left peripherally inserted central catheter tip is in the superior vena cava. Right jugular catheter tip is in the superior vena cava. No pneumothorax. There is ill-defined opacity in the right base. Lungs elsewhere are clear. Heart size and pulmonary vascularity are normal. No adenopathy. No bone lesions. IMPRESSION: Tube and catheter positions as described without pneumothorax. Note that the nasogastric tube side port is at the gastroesophageal junction. It may be prudent to advance the nasogastric tube 4-5 cm. Ill-defined airspace opacity right base consistent with pneumonia. Lungs elsewhere clear. Cardiac silhouette within normal limits. Electronically Signed   By: Lowella Grip III M.D.   On: 10/29/2020 10:04     Medications:     Scheduled Medications: . sodium chloride   Intravenous Once  . amiodarone  200 mg Per Tube BID  . bethanechol  10 mg Per Tube TID  . chlorhexidine gluconate (MEDLINE KIT)  15 mL Mouth Rinse BID  . Chlorhexidine Gluconate Cloth  6 each Topical Daily  . docusate  100 mg Per Tube BID  . ezetimibe  10 mg Per Tube Daily  . feeding supplement (PROSource TF)  45 mL Per Tube BID  . free water  100 mL Per Tube Q4H  . mouth rinse  15 mL Mouth Rinse 10 times per day  . metoCLOPramide (REGLAN) injection  5 mg Intravenous Q8H  . pantoprazole sodium  40 mg Per Tube Q1200  . polyethylene glycol  17 g Per Tube Daily  . rosuvastatin  20 mg Per Tube Daily  . sodium chloride flush  10-40 mL Intracatheter Q12H  . sodium chloride flush  10-40 mL Intracatheter Q12H    Infusions: . sodium chloride Stopped (10/26/20 0828)  . sodium chloride Stopped (10/29/20 0700)  . sodium chloride    . sodium chloride Stopped (10/29/20 1142)  . dexmedetomidine (PRECEDEX) IV infusion Stopped (10/30/20 0736)  . dextrose 50 mL/hr at 10/30/20 0908  . feeding supplement (VITAL 1.5 CAL) Stopped  (10/29/20 0457)  . norepinephrine (LEVOPHED) Adult infusion 12 mcg/min (10/30/20 0900)  . prismasol BGK  2/2.5 dialysis solution 1,500 mL/hr at 10/30/20 0801  . prismasol BGK 2/2.5 replacement solution 500 mL/hr at 10/30/20 0746  . prismasol BGK 2/2.5 replacement solution 300 mL/hr at 10/29/20 1837    PRN Medications: Place/Maintain arterial line **AND** sodium chloride, sodium chloride, sodium chloride, sodium chloride, acetaminophen (TYLENOL) oral liquid 160 mg/5 mL, alteplase, fentaNYL (SUBLIMAZE) injection, heparin, heparin, heparin, heparin, ipratropium-albuterol, ondansetron **OR** ondansetron (ZOFRAN) IV, sodium chloride flush   Assessment/Plan:   1. Acute Hypoxemic Respiratory Failure - due to PNA and HF - Developed acute respiratory failure on 12/4 and required intubation.  - PNA much improved. PCT 109 -> 84->43 -> 18 - Extubated 12/8 but required re intubation.  - Completed vanc/meropenem but WBC up trending. CXR worse and having more secretions. Would restart abx. D/w CCM at bedside - We had been working toward extubation but now had setback with RP bleed. I discussed trach with him today and he clearly understood the question and nodded yest that he would want this.   2. Shock - Initially sepsis/cardiogenic combined. Now with component of hemorrhagic shock in setting of RP bleed - Echo 12/4 EF with EF 25-30% and WMA. RV normal.   - Echo 12/18 EF 55-60% - Completed vanc/mero on 12/15. Initial cultures negative. Now w/ worsening leukocytosis. WBC up to 22K.  - Back on NE. - Restart abx - Continue to transfuse as needed  3. Large RP bleed with acute blood loss anemia - developed on 12/18 - transfused 3 units 12/18 - transfused 2 units 12/19 - CT 12/20 with expanding RP bleed. (no contrast so unable to see ongoing bleeding) - Hgb 12.8 -> 9.5 - Concern for ongoing bleed.  - Repeat Hgb stat. If still bleeding will transfuse and get contrast CT to try and localize. If can  localize will ask IR to attempt coil if able   4. Acute Systolic Heart Failure  - As above Echo 12/4 EF 25-30% . No previous ECHO. No previous cath.  - Hs Trop [phone].  - Echo 12/18 EF 55-60%.  - RHC 12/6 with low filling pressures after diuresis and low index 1.9. SVR 1467 - Suspect combination of septic and cardiogenic shock. Troponin moderately elevated but flat. Possibility of Tako-tsubo/septic CM has been raised and seems to fit clinically but cannot rule out CAD.  If he recovers and creatinine comes down, cosndier coronary angiography.   5. PAF - in NSR on po amio - off heparin with RP bleed  6. Back Pain--> S/P L5-S1 laminectomy with fusion 09/28/2020  Per Neurosugery  7. AKI - Started HD on 12/15. Continue CVVHD  8. Debility -very weak  suspect component ICU myopathy  9. Partial code - Continue vent support and aggressive care. - No CPR or shocks  CRITICAL CARE Performed by: Glori Bickers  Total critical care time: 45 minutes  Critical care time was exclusive of separately billable procedures and treating other patients.  Critical care was necessary to treat or prevent imminent or life-threatening deterioration.  Critical care was time spent personally by me (independent of midlevel providers or residents) on the following activities: development of treatment plan with patient and/or surrogate as well as nursing, discussions with consultants, evaluation of patient's response to treatment, examination of patient, obtaining history from patient or surrogate, ordering and performing treatments and interventions, ordering and review of laboratory studies, ordering and review of radiographic studies, pulse oximetry and re-evaluation of patient's condition.   Length of Stay: 20  Glori Bickers, MD  9:38  AM

## 2020-10-30 NOTE — Progress Notes (Signed)
OT Cancellation Note  Patient Details Name: AZIAH BROSTROM MRN: [id number] DOB: 1935/01/23   Cancelled Treatment:    Reason Eval/Treat Not Completed: Medical issues which prohibited therapy. Intubated and HD. Large RP hemorrhage. Hold per RN. Will sign off and please reconsult when medically ready. Thank you.   [place], OTR/L Acute Rehab Pager: [phone] Office: [phone] 10/30/2020, 12:48 PM

## 2020-10-31 ENCOUNTER — Inpatient Hospital Stay (HOSPITAL_COMMUNITY): Payer: Medicare Other

## 2020-10-31 LAB — PHOSPHORUS: Phosphorus: 3.6 mg/dL (ref 2.5–4.6)

## 2020-10-31 LAB — COMPREHENSIVE METABOLIC PANEL
ALT: 188 U/L — ABNORMAL HIGH (ref 0–44)
AST: 100 U/L — ABNORMAL HIGH (ref 15–41)
Albumin: 1.8 g/dL — ABNORMAL LOW (ref 3.5–5.0)
Alkaline Phosphatase: 165 U/L — ABNORMAL HIGH (ref 38–126)
Anion gap: 10 (ref 5–15)
BUN: 34 mg/dL — ABNORMAL HIGH (ref 8–23)
CO2: 23 mmol/L (ref 22–32)
Calcium: 7.1 mg/dL — ABNORMAL LOW (ref 8.9–10.3)
Chloride: 99 mmol/L (ref 98–111)
Creatinine, Ser: 1.46 mg/dL — ABNORMAL HIGH (ref 0.61–1.24)
GFR, Estimated: 47 mL/min — ABNORMAL LOW (ref >60)
Glucose, Bld: 188 mg/dL — ABNORMAL HIGH (ref 70–99)
Potassium: 3.2 mmol/L — ABNORMAL LOW (ref 3.5–5.1)
Sodium: 132 mmol/L — ABNORMAL LOW (ref 135–145)
Total Bilirubin: 1.3 mg/dL — ABNORMAL HIGH (ref 0.3–1.2)
Total Protein: 5.5 g/dL — ABNORMAL LOW (ref 6.5–8.1)

## 2020-10-31 LAB — GLUCOSE, CAPILLARY
Glucose-Capillary: 129 mg/dL — ABNORMAL HIGH (ref 70–99)
Glucose-Capillary: 165 mg/dL — ABNORMAL HIGH (ref 70–99)
Glucose-Capillary: 179 mg/dL — ABNORMAL HIGH (ref 70–99)

## 2020-10-31 LAB — CBC
HCT: 25.3 % — ABNORMAL LOW (ref 39.0–52.0)
Hemoglobin: 9 g/dL — ABNORMAL LOW (ref 13.0–17.0)
MCH: 31.1 pg (ref 26.0–34.0)
MCHC: 35.6 g/dL (ref 30.0–36.0)
MCV: 87.5 fL (ref 80.0–100.0)
Platelets: 199 10*3/uL (ref 150–400)
RBC: 2.89 MIL/uL — ABNORMAL LOW (ref 4.22–5.81)
RDW: 13.9 % (ref 11.5–15.5)
WBC: 15.5 10*3/uL — ABNORMAL HIGH (ref 4.0–10.5)
nRBC: 0.9 % — ABNORMAL HIGH (ref 0.0–0.2)

## 2020-10-31 LAB — CULTURE, RESPIRATORY W GRAM STAIN

## 2020-10-31 LAB — APTT: aPTT: 35 seconds (ref 24–36)

## 2020-10-31 LAB — PROCALCITONIN: Procalcitonin: 3.27 ng/mL

## 2020-10-31 LAB — MAGNESIUM: Magnesium: 2.6 mg/dL — ABNORMAL HIGH (ref 1.7–2.4)

## 2020-10-31 MED ORDER — FENTANYL 2500MCG IN NS 250ML (10MCG/ML) PREMIX INFUSION: 0.0000 ug/h | INTRAVENOUS | Status: DC

## 2020-10-31 MED ORDER — PRISMASOL BGK 4/2.5 32-4-2.5 MEQ/L REPLACEMENT SOLN: Status: DC

## 2020-10-31 MED ORDER — MIDAZOLAM HCL (PF) 10 MG/2ML IJ SOLN: 2.0000 mg | INTRAMUSCULAR | Status: DC | PRN

## 2020-10-31 MED ORDER — MORPHINE BOLUS VIA INFUSION
0.5000 mg | INTRAVENOUS | Status: DC | PRN
  Filled 2020-10-31: qty 1

## 2020-10-31 MED ORDER — MORPHINE 100MG IN NS 100ML (1MG/ML) PREMIX INFUSION
0.5000 mg/h | INTRAVENOUS | Status: DC
  Administered 2020-10-31: 11:00:00 5 mg/h via INTRAVENOUS
  Administered 2020-10-31: 16:00:00 10 mg/h via INTRAVENOUS
  Filled 2020-10-31 (×2): qty 100

## 2020-10-31 MED ORDER — MIDAZOLAM HCL 2 MG/2ML IJ SOLN: 2.0000 mg | INTRAMUSCULAR | Status: DC | PRN

## 2020-10-31 MED ORDER — GLYCOPYRROLATE 0.2 MG/ML IJ SOLN
0.2000 mg | INTRAMUSCULAR | Status: DC
  Administered 2020-10-31: 0.2 mg via INTRAVENOUS
  Filled 2020-10-31: qty 1

## 2020-10-31 MED ORDER — MIDAZOLAM 50MG/50ML (1MG/ML) PREMIX INFUSION
0.5000 mg/h | INTRAVENOUS | Status: DC
  Administered 2020-10-31: 11:00:00 5 mg/h via INTRAVENOUS
  Administered 2020-10-31: 14:00:00 7.5 mg/h via INTRAVENOUS
  Filled 2020-10-31 (×2): qty 50

## 2020-10-31 MED ORDER — PRISMASOL BGK 4/2.5 32-4-2.5 MEQ/L EC SOLN: Status: DC

## 2020-10-31 MED ORDER — DIPHENHYDRAMINE HCL 50 MG/ML IJ SOLN
50.0000 mg | Freq: Once | INTRAMUSCULAR | Status: AC
  Administered 2020-10-31: 16:00:00 50 mg via INTRAVENOUS
  Filled 2020-10-31: qty 1

## 2020-11-03 LAB — CULTURE, BLOOD (ROUTINE X 2)
Culture: NO GROWTH
Culture: NO GROWTH

## 2020-11-10 NOTE — Progress Notes (Signed)
Patient expired at 35. Pronounced by Cybill RN and this RN. Bensimhon MD notified as well as Dr. Zada Finders who was on call for patient's attending physician Dr. Annette Stable.

## 2020-11-10 NOTE — Progress Notes (Signed)
No new issues or problems overnight.  Patient wide awake and aware.  Follows commands bilaterally.  Significant generalized weakness secondary to severe deconditioning.  Patient denies pain or other discomfort.  Plan for extubation with comfort care measures later today.

## 2020-11-10 NOTE — Progress Notes (Signed)
Patient ID: Dennis Carey, male   DOB: May 12, 1935, 86 y.o.   MRN: [id number] S: Pt seen and examined while on CRRT.  Awake and alert, no complaints of abdominal or back pain.  Family at bedside O:BP (!) 147/49 Comment: aline  Pulse 65   Temp (!) 97.2 F (36.2 C) (Axillary)   Resp 18   Ht '5\' 7"'  (1.702 m)   Wt 70.3 kg   SpO2 100%   BMI 24.27 kg/m   Intake/Output Summary (Last 24 hours) at November 17, 2020 0847 Last data filed at 2020-11-17 0700 Gross per 24 hour  Intake 2073.28 ml  Output 1669 ml  Net 404.28 ml   Intake/Output: I/O last 3 completed shifts: In: 3049.2 [I.V.:2221.9; NG/GT:360; IV Piggyback:467.3] Out: 2752 [Emesis/NG output:150; EVOJJ:0093; Stool:400]  Intake/Output this shift:  No intake/output data recorded. Weight change: 1 kg Gen: intubated but awake and alert CVS: RRR Resp:cta Abd: distended, hypoactive bowel sounds, nontender Ext: trace edema  Recent Labs  Lab 10/27/20 2018 10/27/20 2111 10/28/20 0328 10/28/20 1152 10/28/20 1404 10/29/20 0418 10/29/20 1815 10/30/20 0339 10/30/20 1147 10/30/20 1512 Nov 17, 2020 0425  NA 134*   < > 133*  --  135 134*  136 133* 133*  --  132* 132*  K 5.9*   < > 6.0*  --  5.1 4.8  4.7 4.3 3.6  --  3.6 3.2*  CL 101  --  101  --  103 102 100 100  --  98 99  CO2 23  --  21*  --  20* 20* 21* 24  --  23 23  GLUCOSE 273*  --  180*  --  176* 206* 99 195*  --  167* 188*  BUN 66*  --  83*  --  69* 65* 60* 47*  --  41* 34*  CREATININE 2.35*  --  2.60*  --  2.08* 1.94* 2.16* 1.76*  --  1.69* 1.46*  ALBUMIN 2.0*  --  2.0* 2.0* 1.9* 2.0* 1.9* 1.7* 1.7* 1.7* 1.8*  CALCIUM 7.3*  --  7.2*  --  7.2* 7.1* 7.0* 7.0*  --  6.8* 7.1*  PHOS  --   --  9.2*  --  6.8* 5.5* 4.9* 4.5  --  4.3 3.6  AST 324*  --   --  203*  --   --   --   --  126*  --  100*  ALT 552*  --   --  388*  --   --   --   --  226*  --  188*   < > = values in this interval not displayed.   Liver Function Tests: Recent Labs  Lab 10/28/20 1152 10/28/20 1404  10/30/20 1147 10/30/20 1512 17-Nov-2020 0425  AST 203*  --  126*  --  100*  ALT 388*  --  226*  --  188*  ALKPHOS 225*  --  168*  --  165*  BILITOT 1.0  --  1.0  --  1.3*  PROT 6.0*  --  5.4*  --  5.5*  ALBUMIN 2.0*   < > 1.7* 1.7* 1.8*   < > = values in this interval not displayed.   No results for input(s): LIPASE, AMYLASE in the last 168 hours. Recent Labs  Lab 10/28/20 1152  AMMONIA 35   CBC: Recent Labs  Lab 10/27/20 2018 10/27/20 2111 10/29/20 0418 10/30/20 0339 10/30/20 1000 10/30/20 1743 2020/11/17 0425  WBC 13.0*   < > 22.5* 24.3* 22.8* 18.5*  15.5*  NEUTROABS 11.2*  --   --   --   --  15.8*  --   HGB 7.1*   < > 12.8*  12.2* 9.2* 9.4* 8.9* 9.0*  HCT 23.5*   < > 36.9*  36.0* 27.3* 27.3* 26.2* 25.3*  MCV 93.6   < > 85.6 88.3 88.1 87.9 87.5  PLT 262   < > 164 174 179 176 199   < > = values in this interval not displayed.   Cardiac Enzymes: No results for input(s): CKTOTAL, CKMB, CKMBINDEX, TROPONINI in the last 168 hours. CBG: Recent Labs  Lab 10/30/20 1517 10/30/20 1955 Nov 30, 2020 0030 11-30-20 0436 30-Nov-2020 0804  GLUCAP 152* 133* 129* 179* 165*    Iron Studies: No results for input(s): IRON, TIBC, TRANSFERRIN, FERRITIN in the last 72 hours. Studies/Results: CT ABDOMEN PELVIS WO CONTRAST  Addendum Date: 10/29/2020   ADDENDUM REPORT: 10/29/2020 16:29 ADDENDUM: These results were called by telephone at the time of interpretation on 10/29/2020 at 4:29 pm to provider [facility] DAVIS , who verbally acknowledged these results. Electronically Signed   By: Constance Holster M.D.   On: 10/29/2020 16:29   Result Date: 10/29/2020 CLINICAL DATA:  Abdominal distension. Concern for retroperitoneal bleed. EXAM: CT ABDOMEN AND PELVIS WITHOUT CONTRAST TECHNIQUE: Multidetector CT imaging of the abdomen and pelvis was performed following the standard protocol without IV contrast. COMPARISON:  CT dated 10/27/2020 FINDINGS: Lower chest: There are small bilateral pleural effusions.  There is atelectasis at the lung bases. Coarse bilateral airspace opacities are noted.The heart size is normal. Hepatobiliary: The liver is normal. Normal gallbladder.There is no biliary ductal dilation. Pancreas: Normal contours without ductal dilatation. No peripancreatic fluid collection. Spleen: Unremarkable. Adrenals/Urinary Tract: --Adrenal glands: Unremarkable. --Right kidney/ureter: No hydronephrosis or radiopaque kidney stones. --Left kidney/ureter: Left kidney is displaced anteriorly. Otherwise, the left kidney is unremarkable. --Urinary bladder: Unremarkable. Stomach/Bowel: --Stomach/Duodenum: No hiatal hernia or other gastric abnormality. Normal duodenal course and caliber. --Small bowel: Unremarkable. --Colon: A rectal tube is noted. There are scattered colonic diverticula without CT evidence for diverticulitis. --Appendix: Normal. Vascular/Lymphatic: Atherosclerotic calcification is present within the non-aneurysmal abdominal aorta, without hemodynamically significant stenosis. --No retroperitoneal lymphadenopathy. --No mesenteric lymphadenopathy. --No pelvic or inguinal lymphadenopathy. Reproductive: Unremarkable Other: There is a small amount of free fluid in the abdomen pelvis. No free air. There is a large retroperitoneal hematoma on the left measuring approximately 10 by 11 by 15 cm. This retroperitoneal hematoma has likely increased in size since the patient's recent CT. Detection of active extravasation is not possible on this study secondary to lack of IV contrast. Musculoskeletal. There are degenerative changes of the lumbar spine. The patient is status post prior posterior fusion from L5-S1 with an interbody spacer at the L5-S1 level. IMPRESSION: 1. Large retroperitoneal hematoma on the left as measured above. This has likely increased in size since the patient's prior CT dated 10/27/2020. Detection of active bleeding is not possible on this noncontrast study. 2. Small bilateral pleural  effusions with adjacent atelectasis. 3. Coarse bilateral airspace opacities concerning for multifocal pneumonia or aspiration. Aortic Atherosclerosis (ICD10-I70.0). Electronically Signed: By: Constance Holster M.D. On: 10/29/2020 16:13   DG CHEST PORT 1 VIEW  Result Date: 11/30/20 CLINICAL DATA:  Multifocal pneumonia EXAM: PORTABLE CHEST 1 VIEW COMPARISON:  10/30/2020 and prior. FINDINGS: Stable support lines and tubes. No pneumothorax or pleural effusion. Cardiomediastinal silhouette is unchanged. Diffuse interstitial prominence, unchanged. Increased conspicuity of patchy left basilar opacities. IMPRESSION: Unchanged interstitial prominence with increased left basilar  opacities. Stable support lines and tubes. Electronically Signed   By: Primitivo Gauze M.D.   On: 11/12/20 08:12   DG CHEST PORT 1 VIEW  Result Date: 10/30/2020 CLINICAL DATA:  Intubation.  Respiratory failure. EXAM: PORTABLE CHEST 1 VIEW COMPARISON:  Chest x-ray 10/29/2020.  CT 10/27/2020. FINDINGS: Endotracheal tube, NG tube, right IJ dual-lumen catheter, left PICC line in stable position. Heart size stable. Diffuse bilateral interstitial infiltrates again noted. Slight improvement in aeration in the right lung base. No prominent pleural effusion. No pneumothorax. IMPRESSION: 1. Lines and tubes in stable position. 2. Diffuse bilateral interstitial infiltrates again noted. Slight improvement in aeration in the right lung base. Electronically Signed   By: Marcello Moores  Register   On: 10/30/2020 06:54   DG CHEST PORT 1 VIEW  Result Date: 10/29/2020 CLINICAL DATA:  Hypoxia EXAM: PORTABLE CHEST 1 VIEW COMPARISON:  October 27, 2020 FINDINGS: Endotracheal tube tip is 3.2 cm above the carina. Nasogastric tube tip is below the diaphragm with side port at the gastroesophageal junction. Left peripherally inserted central catheter tip is in the superior vena cava. Right jugular catheter tip is in the superior vena cava. No pneumothorax. There  is ill-defined opacity in the right base. Lungs elsewhere are clear. Heart size and pulmonary vascularity are normal. No adenopathy. No bone lesions. IMPRESSION: Tube and catheter positions as described without pneumothorax. Note that the nasogastric tube side port is at the gastroesophageal junction. It may be prudent to advance the nasogastric tube 4-5 cm. Ill-defined airspace opacity right base consistent with pneumonia. Lungs elsewhere clear. Cardiac silhouette within normal limits. Electronically Signed   By: Lowella Grip III M.D.   On: 10/29/2020 10:04   . sodium chloride   Intravenous Once  . amiodarone  200 mg Per Tube BID  . bethanechol  10 mg Per Tube TID  . chlorhexidine gluconate (MEDLINE KIT)  15 mL Mouth Rinse BID  . Chlorhexidine Gluconate Cloth  6 each Topical Daily  . docusate  100 mg Per Tube BID  . ezetimibe  10 mg Per Tube Daily  . feeding supplement (PROSource TF)  45 mL Per Tube BID  . free water  100 mL Per Tube Q4H  . mouth rinse  15 mL Mouth Rinse 10 times per day  . metoCLOPramide (REGLAN) injection  5 mg Intravenous Q8H  . pantoprazole sodium  40 mg Per Tube Q1200  . polyethylene glycol  17 g Per Tube Daily  . rosuvastatin  20 mg Per Tube Daily  . sodium chloride flush  10-40 mL Intracatheter Q12H  . sodium chloride flush  10-40 mL Intracatheter Q12H    BMET    Component Value Date/Time   NA 132 (L) 2020/11/12 0425   K 3.2 (L) 11-12-2020 0425   CL 99 11/12/2020 0425   CO2 23 Nov 12, 2020 0425   GLUCOSE 188 (H) 11-12-20 0425   BUN 34 (H) Nov 12, 2020 0425   CREATININE 1.46 (H) 11-12-20 0425   CALCIUM 7.1 (L) 12-Nov-2020 0425   GFRNONAA 47 (L) 12-Nov-2020 0425   GFRAA 58 (L) 06/08/2020 0647   CBC    Component Value Date/Time   WBC 15.5 (H) November 12, 2020 0425   RBC 2.89 (L) 11/12/20 0425   HGB 9.0 (L) 11-12-2020 0425   HCT 25.3 (L) November 12, 2020 0425   PLT 199 11/12/2020 0425   MCV 87.5 11-12-20 0425   MCH 31.1 November 12, 2020 0425   MCHC 35.6  Nov 12, 2020 0425   RDW 13.9 11/12/20 0425   LYMPHSABS 1.7 10/30/2020  1743   MONOABS 0.5 10/30/2020 1743   EOSABS 0.2 10/30/2020 1743   BASOSABS 0.0 10/30/2020 1743    Assessment/Plan:  1. AKI/CKD stage IIIa- presumably ischemic ATN in setting of hemorrhagic shock and ongoing vasopressor support. Started on IHD 10/24/20 and transitioned to CRRT on 10/26/20 1. CRRT started 10/26/20 2. All fluids 2K/2.5Ca: Pre-filter 500 ml/hr, post filter 300 ml/hr, dialysate 1500 ml/hr 3. Will change fluids to 4K/2.5Ca due to hypokalemia. 4. Keep even to + due to low CVP 5. RIJ temp HD cath placed 10/24/20 6. No heparin with CRRT. 7. Continue with CRRT while he remains on pressors. 2. Hemorrhagic shock due to large retroperitoneal bleed seen on CT scan 10/27/20 requiring blood transfusions and vasopressors.  Still on levophed 1. Hgb stable overnight. 3. Acute hypoxic respiratory failure due to PNA and heart failure. Extubated 10/17/20 but re-intubated and may need trach. 1. Will likely require trach towards the end of the week. 4. Acute systolic CHF- ECHO with EF 25-30%. Per heartfailure team combination of septic and cardiogenic shock.  5. Large retroperitoneal bleed with ABLA- transfuse prn. No heparin 1. By CT scan it doubled in size but Hgb stable overnight. 6. Hyperkalemia- resolved with CRRT now with low K and will change dialysate fluids to 4K/2.5Ca 7. PAF- off heparin due to RP bleed 8. S/p L5-S1 laminectomy with fusion. 9. Disposition- poor overall prognosis and family discussing goals of care and is now DNR.  He is awake and alert so will continue with CRRT for now unless his condition changes and/or family decides to move to full comfort care.  Donetta Potts, MD [employer] [phone]

## 2020-11-10 NOTE — Progress Notes (Signed)
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  12/8 Extubated but required re-intubation.  12/10 Reintubated for hypoxia this morning, increased work of breathing.   Broad-spectrum antibiotics continued.   12/15 started HD 12/17 switched to CVVHD 12/18 Developed shock workup revealed Large RP hemorrhage 12/21 Drop in HGB of 3 grams  Consults:  Neurosurgery Cardiology Renal  Procedures:  10/13/20 Intubation 12/5 bronch 12/15 > R IJ HD cath  Significant Diagnostic Tests:   10/11/20 Chest X-ray, minimal left basilar atelectasis  12/3 LE venous doppler: neg  12/4 echo: LVEF 25-30%  12/10 KUB: Consistent with colonic ileus-gas present down to rectum  12/13 Renal US > mild perinephric fluid, no obstruction.  12/18 head CT >> no acute change  CT-PA 12/18 >> no PE, bilateral effusions with associated basilar groundglass/atelectasis, new left retroperitoneal hemorrhage  12/20 CT Abdomen and Pelvis > Large retroperitoneal hematoma on the left as measured above. This has likely increased in size since the patient's prior CT   Micro Data:  10/16/2020 COVID, Flu neg 12/4:  MRSA PCR neg 12/4 Bronch tracheal aspirate -normal flora 12/4 blood: Negative  Antimicrobials:  Vanc 12/4 > 12/13 Cefepime 12/4 > 12/10 Doxycycline 12/6 > 2/9 Merrem 12/10 > 12/16  Interim history/subjective:  Hemoglobin Carey overnight at 9.0 Patient continues to require as needed fentanyl  Family present at bedside  Objective   Blood pressure (!) 147/49, pulse  65, temperature (!) 97.2 F (36.2 C), temperature source Axillary, resp. rate 18, height 5\' 7"  (1.702 m), weight 70.3 kg, SpO2 100 %. CVP:  [0 mmHg-8 mmHg] 8 mmHg  Vent Mode: PRVC FiO2 (%):  [40 %] 40 % Set Rate:  [28 bmp] 28 bmp Vt Set:  [520 mL] 520 mL PEEP:  [5 cmH20] 5 cmH20 Pressure Support:  [5 cmH20-8 cmH20] 8 cmH20 Plateau Pressure:  [13 cmH20-16 cmH20] 16 cmH20   Intake/Output Summary (Last 24 hours) at 2020-11-01 [id number] Last data filed at November 01, 2020 0700 Gross per 24 hour  Intake 2073.28 ml  Output 1669 ml  Net 404.28 ml   Filed Weights   10/29/20 0500 10/30/20 0500 November 01, 2020 0500  Weight: 69.9 kg 69.3 kg 70.3 kg   Physical Exam: General: Chronically ill appearing very deconditioned elderly male lying in bed on mechanical ventilation in no acute distress HEENT: ETT, MM pink/moist, PERRL,  Neuro: Appears alert and likely oriented however unable to fully assess given weakness and mechanical ventilator CV: s1s2 regular rate and rhythm, no murmur, rubs, or gallops,  PULM: Mechanical breath sounds, no added breath sounds on auscultation, tolerating ventilator GI: soft, bowel sounds active in all 4 quadrants, very tender to palpation, distended, tolerating TF Extremities: warm/dry, no edema  Skin: no rashes or lesions  Resolved problems:  Hypernatremia Hyperkalemia Adynamic ileus A.flutter with RVR  Assessment & Plan:   L5-S1 DJD s/p laminectomy/decompression P: Postop care per neurosurgery Supportive care  Acute hypoxic respiratory failure due to acute pulm edema and likely aspiration pneumonitis vs HCAP - (s/p 10 days vanc and 7 days merrem) Tracheal aspirate 12/20 with moderate  enterococcus Faaecalis P: Family currently discussing plan of care, compassionate extubation possibly today Continue ventilator support with lung protective strategies Wean PEEP and FiO2 for sats greater than 90%. Head of bed elevated 30 degrees. Plateau pressures less than 30 cm H20.   Follow intermittent chest x-ray and ABG.   SAT/SBT as tolerated, mentation preclude extubation  Ensure adequate pulmonary hygiene  Follow cultures  VAP bundle in place  PAD protocol  Continued shock -Initially presented with signs of septic and cardiogenic shock however now there is also a component of hemorrhagic shock given spontaneous retroperitoneal bleed Acute left retroperitoneal bleed  -which appears to be enlarging with drop in HGB from 12 to 9 overnight 12/21 Remains on pressors P: Hemoglobin remained Carey Supportive care Transfuse for hemoglobin greater than 8 Broad-spectrum antibiotics resumed 12/21 for worsening chest x-ray and increased secretions Continue pressor support for MAP goal greater than 65 Follow CBC and bmet  Acute metabolic encephalopathy, acutely worse 12/18 in the setting of hypotension.  Repeat CT head reassuring P: Minimize sedation as able Correct underlying metabolic issues  AKI due to ATN -Obstruction was ruled out by negative venous ultrasound P: Nephrology consulted, appreciate assistance Remains on CRRT since 12/17 No urine output Follow BMP Renally dose medications  Acute systolic congestive heart failure -Right heart cath 12/6 with low filling pressures P: Heart failure team following, appreciate assistance  Echo as above Supportive care If aggressive measures desired will need coronary angiogram  Type 2 diabetes, well controlled -Blood sugar was well controlled on sliding scale insulin, Levemir, and to be coverage however tube feeds have been stopped due to questionable developing ileus has now mildly hypoglycemic P: Continue to monitor CBGs every 4 hours Long-acting insulin on hold due to n.p.o. status  Anemia of acute illness and now due to retroperitoneal bleed 12/18, -Drop in HGB again 12/21 from 12 to 9>> Confirmed on repeat. Remains on pressors to maintain MAP P: Follow CBC Transfuse for hemoglobin goal greater than  8  Generalized deconditioning. P: PT/OT efforts when able  Ileus -Review it appears patient has been intermittently experiencing an ileus.  This was initially treated with bowel rest and Reglan and had resolved.  Most recent bowel movement reported 12/19 -Morning of 12/20 abdomen is quite distended with significantly hypoactive bowel sounds P: Tube feeds on hold Bowel sounds were active 12/22   Best practice (evaluated daily)  Diet: TF Pain/Anxiety/Delirium protocol (if indicated): Hold all sedation. VAP protocol (if indicated): ordered DVT prophylaxis: Subcu heparin GI prophylaxis: PPI Glucose control: SSI, levemir Mobility: BR Code Status: Full Communication: Daughter updated at bedside 12/20 Disposition: ICU   Discussion: Family is currently discussing desire for plan of care.  Likely proceeding with compassionate extubation today.  They will inform clinical team with decision has been made.  In the meantime we will continue to support patient fully.   Goals of Care:  Last date of multidisciplinary goals of care discussion:12/20 Family and staff present: Wife, both daughters, and son, Dr. Haroldine Laws, bedside nurse, and Merlene Laughter NP Summary of discussion: Continue current management with continued discussion of plan of care, please see plan of care note documented 12/20 Follow up goals of care discussion due: Ongoing Code Status: Full  Labs   CBC: Recent Labs  Lab 10/27/20 2018 10/27/20 2111 10/29/20 0418 10/30/20 0339 10/30/20 1000 10/30/20 1743 10/27/2020 0425  WBC 13.0*   < > 22.5* 24.3* 22.8* 18.5* 15.5*  NEUTROABS 11.2*  --   --   --   --  15.8*  --   HGB 7.1*   < > 12.8*  12.2* 9.2* 9.4* 8.9* 9.0*  HCT 23.5*   < > 36.9*  36.0* 27.3* 27.3* 26.2* 25.3*  MCV 93.6   < > 85.6 88.3 88.1 87.9 87.5  PLT 262   < > 164 174 179 176 199   < > = values in this interval not displayed.    Basic Metabolic Panel: Recent Labs  Lab 10/27/20 0355 10/27/20 1620  10/28/20 0328 10/28/20 1404 10/29/20 0418 10/29/20 1815 10/30/20 0339 10/30/20 1512 November 13, 2020 0425  NA 139   < > 133*   < > 134*  136 133* 133* 132* 132*  K 4.5   < > 6.0*   < > 4.8  4.7 4.3 3.6 3.6 3.2*  CL 102   < > 101   < > 102 100 100 98 99  CO2 22   < > 21*   < > 20* 21* 24 23 23   GLUCOSE 134*   < > 180*   < > 206* 99 195* 167* 188*  BUN 86*   < > 83*   < > 65* 60* 47* 41* 34*  CREATININE 3.03*   < > 2.60*   < > 1.94* 2.16* 1.76* 1.69* 1.46*  CALCIUM 7.7*   < > 7.2*   < > 7.1* 7.0* 7.0* 6.8* 7.1*  MG 2.4  --  2.5*  --  2.8*  --  2.4  --  2.6*  PHOS 8.2*   < > 9.2*   < > 5.5* 4.9* 4.5 4.3 3.6   < > = values in this interval not displayed.   GFR: Estimated Creatinine Clearance: 35.8 mL/min (A) (by C-G formula based on SCr of 1.46 mg/dL (H)). Recent Labs  Lab 10/27/20 2320 10/28/20 0328 10/28/20 1004 10/29/20 1003 10/29/20 1114 10/29/20 1241 10/30/20 0339 10/30/20 1000 10/30/20 1743 13-Nov-2020 0425  PROCALCITON  --   --   --   --  7.98  --  4.85  --   --  3.27  WBC  --  17.5*   < >  --   --   --  24.3* 22.8* 18.5* 15.5*  LATICACIDVEN 1.5 1.4  --  1.4  --  1.6  --   --   --   --    < > = values in this interval not displayed.    Liver Function Tests: Recent Labs  Lab 10/27/20 2018 10/28/20 0328 10/28/20 1152 10/28/20 1404 10/29/20 1815 10/30/20 0339 10/30/20 1147 10/30/20 1512 11/13/2020 0425  AST 324*  --  203*  --   --   --  126*  --  100*  ALT 552*  --  388*  --   --   --  226*  --  188*  ALKPHOS 246*  --  225*  --   --   --  168*  --  165*  BILITOT 0.9  --  1.0  --   --   --  1.0  --  1.3*  PROT 6.0*  --  6.0*  --   --   --  5.4*  --  5.5*  ALBUMIN 2.0*   < > 2.0*   < > 1.9* 1.7* 1.7* 1.7* 1.8*   < > = values in this interval not displayed.   No results for input(s): LIPASE, AMYLASE in the last 168 hours. Recent Labs  Lab 10/28/20 1152  AMMONIA 35    ABG  Component Value Date/Time   PHART 7.452 (H) 10/29/2020 0418   PCO2ART 32.7 10/29/2020  0418   PO2ART 142 (H) 10/29/2020 0418   HCO3 22.9 10/29/2020 0418   TCO2 24 10/29/2020 0418   ACIDBASEDEF 1.0 10/27/2020 2111   O2SAT 99.0 10/29/2020 0418     Coagulation Profile: Recent Labs  Lab 10/27/20 2320 10/30/20 1147  INR 1.4* 1.4*    Cardiac Enzymes: No results for input(s): CKTOTAL, CKMB, CKMBINDEX, TROPONINI in the last 168 hours.  HbA1C: Hgb A1c MFr Bld  Date/Time Value Ref Range Status  10/12/2020 04:10 PM 7.7 (H) 4.8 - 5.6 % Final    Comment:    (NOTE) Pre diabetes:          5.7%-6.4%  Diabetes:              >6.4%  Glycemic control for   <7.0% adults with diabetes     CBG: Recent Labs  Lab 10/30/20 1517 10/30/20 1955 11/24/20 0030 2020/11/24 0436 November 24, 2020 0804  GLUCAP 152* 133* 129* 179* 165*      Critical care time:    Performed by: Johnsie Cancel  Total critical care time: 38 minutes  Critical care time was exclusive of separately billable procedures and treating other patients.  Critical care was necessary to treat or prevent imminent or life-threatening deterioration.  Critical care was time spent personally by me on the following activities: development of treatment plan with patient and/or surrogate as well as nursing, discussions with consultants, evaluation of patient's response to treatment, examination of patient, obtaining history from patient or surrogate, ordering and performing treatments and interventions, ordering and review of laboratory studies, ordering and review of radiographic studies, pulse oximetry and re-evaluation of patient's condition.  Johnsie Cancel, NP-C [facility] Contact / Pager information can be found on Amion  11-24-2020, 8:51 AM

## 2020-11-10 NOTE — Progress Notes (Signed)
[facility] notified of TOD. Full release of body to the [facility]. Bedside staff made aware.

## 2020-11-10 NOTE — Progress Notes (Signed)
PT Cancellation Note  Patient Details Name: Dennis Carey MRN: [id number] DOB: 1935-09-08   Cancelled Treatment:    Reason Eval/Treat Not Completed: Medical issues which prohibited therapy (PT discontinued)   [name] 11/17/20, 1:39 PM Toyia Jelinek W,PT Acute Rehabilitation Services Pager:  [phone]  Office:  [phone]

## 2020-11-10 NOTE — Progress Notes (Addendum)
1254: Family at bedside. Progression: Pt having increase episodes of apnea, no distress noted after morphine bolus given at 1250 for increase work of breathing and slight brow furrowing. On morphine gtt at 10mg /hr and versed gtt at 7.5mg /hr. Family comforted and updated with progression of process. This RN at bedside, continue to monitor.

## 2020-11-10 NOTE — Progress Notes (Signed)
Pt compassionately extubated by Dr. Haroldine Laws.

## 2020-11-10 NOTE — Discharge Summary (Signed)
Physician Discharge Summary  Patient ID: Dennis Carey MRN: [id number] DOB/AGE: Apr 25, 1935 86 y.o.  Admit date: 11/04/2020 Discharge date: 11/01/2020  Admission Diagnoses:  Discharge Diagnoses:  Active Problems:   Postoperative pain   Septic shock (HCC)   Malnutrition of moderate degree   Mucus plugging of bronchi   Shortness of breath   ARDS (adult respiratory distress syndrome) (HCC)   Ileus (HCC)   Stress-induced cardiomyopathy   Abdominal distention   AKI (acute kidney injury) (HCC)   CHF (congestive heart failure) (HCC)   Endotracheally intubated   Acute respiratory failure with hypoxia (HCC)   Encephalopathy acute   Discharged Condition: Deceased  Hospital Course:  Patient was admitted to the hospital approximately 2 weeks status post L5-S1 decompression and fusion surgery.  The patient with difficulty with pain control and increasing  Symptoms related to an upper respiratory infection.  Patient admitted and placed on IV pain medications for pain control.  Chest x-ray and laboratory studies negative for acute infectious process.  Patient with difficulty with over-sedation early in his treatment course which resolved with medication adjustment.  Approximately 4 days into his hospital stay the patient developed acute respiratory failure of unknown etiology - suspect upper airway obstruction secondary to inability to clear secretions with resultant lowering his spasm and postobstructive pulmonary edema.  Patient admitted to the intensive care unit and was intubated.  Patient with severe pulmonary failure.  Patient also with a significant cardiac event around that same time with markedly elevated troponins and severe cardiac wall hypokinesis.  Patient responded to that in toward to support and IV antibiotics although no definite bacterial infection was ever elucidated.  Patient progressed to the point of an extubation trial which unfortunately he failed.  Patient was re intubated.  He  progressively developed renal failure.  He was treated with dialysis.  During his dialysis treatment the patient developed acute hypertension and signs of hypovolemic shock.  Workup demonstrated a large left-sided retroperitoneal hemorrhage of unknown etiology.  Patient was resuscitated but continued to require vasopressor support.  Although his pulmonary condition and his neurologic condition improved he still suffered from severe generalized weakness and inability to maintain adequate blood pressure without  Vasopressors.  His renal failure became complete.  He shows signs of liver failure and began showing signs of possible early sepsis.  After a long discussion with the family and with the patient it was clear that he did not wish to undergo prolonged aggressive care particularly given his low likelihood of long-term improvement and return to independent living.  The patient was made DNR.  He was extubated and comfort measures were instituted.  The patient subsequently expired.  Consults:   Significant Diagnostic Studies:   Treatments:   Discharge Exam: Blood pressure (!) 136/58, pulse (!) 0, temperature (!) 97.2 F (36.2 C), temperature source Axillary, resp. rate (!) 0, height 5\' 7"  (1.702 m), weight 70.3 kg, SpO2 (!) 0 %.   Deceased  Disposition:  There are no questions and answers to display.         Allergies as of 10/12/2020      Reactions   Sulfa Antibiotics Hives      Medication List    ASK your doctor about these medications   aspirin 81 MG tablet Take 81 mg by mouth daily.   diazepam 5 MG tablet Commonly known as: VALIUM Take 1 tablet (5 mg total) by mouth every 6 (six) hours as needed for muscle spasms.   docusate sodium  100 MG capsule Commonly known as: COLACE Take 100 mg by mouth 3 (three) times daily with meals.   ezetimibe 10 MG tablet Commonly known as: ZETIA Take 10 mg by mouth at bedtime.   HYDROcodone-acetaminophen 5-325 MG tablet Commonly known  as: NORCO/VICODIN Take 1 tablet by mouth every 4 (four) hours.   polyethylene glycol powder 17 GM/SCOOP powder Commonly known as: GLYCOLAX/MIRALAX Take 17 g by mouth See admin instructions. Mix 17 grams of powder into 4 ounces of water and drink every morning   pregabalin 75 MG capsule Commonly known as: LYRICA Take 75 mg by mouth 2 (two) times daily.   rosuvastatin 20 MG tablet Commonly known as: CRESTOR Take 20 mg by mouth at bedtime.        Signed: Cooper Render Marliss Buttacavoli 11/01/2020, 10:30 AM

## 2020-11-10 NOTE — Progress Notes (Signed)
  With family at bedside.  CVVHD disconnected. Ne stopped. Patient started on morphine and versed.   I personally extubated. Patient with some secretions and increased WOB. Drips increased.   I have continued to follow throughout the afternoon. Intermittent periods of increased WOB and extra sedation provided as need to ensure comfort. Suspect he will pass this evening.   Additional CCT 45 mins.   Glori Bickers, MD  4:24 PM

## 2020-11-10 NOTE — Progress Notes (Addendum)
Received orders for morphine and versed infusions per Dr. Haroldine Laws. Goal is comfort prior to terminal extubation. ETT pulled 1130 by Dr. Haroldine Laws. Versed and morphine boluses given. Patient -4 RASS.

## 2020-11-10 NOTE — Progress Notes (Signed)
All IV, tubes removed and intact. Pt cleaned. Pt placed in beg and will be transferred to the morgue shortly.  Morphine (175ml)and Versed (55ml) was wasted with Philis Kendall, RN (in pyxis allowed only 100 and 50).

## 2020-11-10 NOTE — Progress Notes (Signed)
Patient ID: Dennis Carey, male   DOB: 03-30-1935, 86 y.o.   MRN: [id number]     Advanced Heart Failure Rounding Note   Subjective:    12/7 Bronch with copious secretions that were removed.  12/8 Extubated but required re-intubation.  12/15 started HD 12/17 switched to CVVHD 12/18 Large RP hemorrhage. Transfused  On NE at 9 for BP support. Remains on CVVHD. Hgb 9.2 -> 9.0. Abx restarted yesterday for worsening secretions. CXR with mild bilateral infiltrates.   Sputum cx ENTEROCOCCUS FAECALIS   Echo 12/18 EF back to 55-60%   Objective:   Weight Range:  Vital Signs:   Temp:  [94.3 F (34.6 C)-97.7 F (36.5 C)] 97.2 F (36.2 C) (12/22 0600) Pulse Rate:  [58-87] 60 (12/22 0900) Resp:  [10-40] 20 (12/22 0900) BP: (136-160)/(49-58) 136/58 (12/22 0854) SpO2:  [95 %-100 %] 100 % (12/22 0900) Arterial Line BP: (118-170)/(39-68) 135/59 (12/22 0900) FiO2 (%):  [40 %] 40 % (12/22 0719) Weight:  [70.3 kg] 70.3 kg (12/22 0500) Last BM Date: 10/30/20  Weight change: Filed Weights   10/29/20 0500 10/30/20 0500 11-Nov-2020 0500  Weight: 69.9 kg 69.3 kg 70.3 kg    Intake/Output:   Intake/Output Summary (Last 24 hours) at 11-11-2020 0920 Last data filed at 2020/11/11 0900 Gross per 24 hour  Intake 2300.59 ml  Output 1713 ml  Net 587.59 ml     PHYSICAL EXAM: General:  Awake on vent  Weak HEENT: normal Neck: supple. RIJ trialysis. Carotids 2+ bilat; no bruits. No lymphadenopathy or thryomegaly appreciated. Cor: PMI nondisplaced. Regular rate & rhythm. No rubs, gallops or murmurs. Lungs: clear Abdomen: soft, + tender, ++ distended. No hepatosplenomegaly. No bruits or masses. Good bowel sounds. Extremities: no cyanosis, clubbing, rash, edema Neuro: alert very weak    Telemetry: Sinus 60-70 Personally reviewed  Labs: Basic Metabolic Panel: Recent Labs  Lab 10/27/20 0355 10/27/20 1620 10/28/20 0328 10/28/20 1404 10/29/20 0418 10/29/20 1815 10/30/20 0339  10/30/20 1512 11-11-2020 0425  NA 139   < > 133*   < > 134*  136 133* 133* 132* 132*  K 4.5   < > 6.0*   < > 4.8  4.7 4.3 3.6 3.6 3.2*  CL 102   < > 101   < > 102 100 100 98 99  CO2 22   < > 21*   < > 20* 21* '24 23 23  ' GLUCOSE 134*   < > 180*   < > 206* 99 195* 167* 188*  BUN 86*   < > 83*   < > 65* 60* 47* 41* 34*  CREATININE 3.03*   < > 2.60*   < > 1.94* 2.16* 1.76* 1.69* 1.46*  CALCIUM 7.7*   < > 7.2*   < > 7.1* 7.0* 7.0* 6.8* 7.1*  MG 2.4  --  2.5*  --  2.8*  --  2.4  --  2.6*  PHOS 8.2*   < > 9.2*   < > 5.5* 4.9* 4.5 4.3 3.6   < > = values in this interval not displayed.    Liver Function Tests: Recent Labs  Lab 10/27/20 2018 10/28/20 0328 10/28/20 1152 10/28/20 1404 10/29/20 1815 10/30/20 0339 10/30/20 1147 10/30/20 1512 11-11-20 0425  AST 324*  --  203*  --   --   --  126*  --  100*  ALT 552*  --  388*  --   --   --  226*  --  188*  ALKPHOS  246*  --  225*  --   --   --  168*  --  165*  BILITOT 0.9  --  1.0  --   --   --  1.0  --  1.3*  PROT 6.0*  --  6.0*  --   --   --  5.4*  --  5.5*  ALBUMIN 2.0*   < > 2.0*   < > 1.9* 1.7* 1.7* 1.7* 1.8*   < > = values in this interval not displayed.   No results for input(s): LIPASE, AMYLASE in the last 168 hours. Recent Labs  Lab 10/28/20 1152  AMMONIA 35    CBC: Recent Labs  Lab 10/27/20 2018 10/27/20 2111 10/29/20 0418 10/30/20 0339 10/30/20 1000 10/30/20 1743 11/09/2020 0425  WBC 13.0*   < > 22.5* 24.3* 22.8* 18.5* 15.5*  NEUTROABS 11.2*  --   --   --   --  15.8*  --   HGB 7.1*   < > 12.8*  12.2* 9.2* 9.4* 8.9* 9.0*  HCT 23.5*   < > 36.9*  36.0* 27.3* 27.3* 26.2* 25.3*  MCV 93.6   < > 85.6 88.3 88.1 87.9 87.5  PLT 262   < > 164 174 179 176 199   < > = values in this interval not displayed.    Cardiac Enzymes: No results for input(s): CKTOTAL, CKMB, CKMBINDEX, TROPONINI in the last 168 hours.  BNP: BNP (last 3 results) Recent Labs    10/13/20 1513  BNP 589.0*    ProBNP (last 3 results) No results  for input(s): PROBNP in the last 8760 hours.    Other results:  Imaging: CT ABDOMEN PELVIS WO CONTRAST  Addendum Date: 10/29/2020   ADDENDUM REPORT: 10/29/2020 16:29 ADDENDUM: These results were called by telephone at the time of interpretation on 10/29/2020 at 4:29 pm to provider [facility] DAVIS , who verbally acknowledged these results. Electronically Signed   By: Constance Holster M.D.   On: 10/29/2020 16:29   Result Date: 10/29/2020 CLINICAL DATA:  Abdominal distension. Concern for retroperitoneal bleed. EXAM: CT ABDOMEN AND PELVIS WITHOUT CONTRAST TECHNIQUE: Multidetector CT imaging of the abdomen and pelvis was performed following the standard protocol without IV contrast. COMPARISON:  CT dated 10/27/2020 FINDINGS: Lower chest: There are small bilateral pleural effusions. There is atelectasis at the lung bases. Coarse bilateral airspace opacities are noted.The heart size is normal. Hepatobiliary: The liver is normal. Normal gallbladder.There is no biliary ductal dilation. Pancreas: Normal contours without ductal dilatation. No peripancreatic fluid collection. Spleen: Unremarkable. Adrenals/Urinary Tract: --Adrenal glands: Unremarkable. --Right kidney/ureter: No hydronephrosis or radiopaque kidney stones. --Left kidney/ureter: Left kidney is displaced anteriorly. Otherwise, the left kidney is unremarkable. --Urinary bladder: Unremarkable. Stomach/Bowel: --Stomach/Duodenum: No hiatal hernia or other gastric abnormality. Normal duodenal course and caliber. --Small bowel: Unremarkable. --Colon: A rectal tube is noted. There are scattered colonic diverticula without CT evidence for diverticulitis. --Appendix: Normal. Vascular/Lymphatic: Atherosclerotic calcification is present within the non-aneurysmal abdominal aorta, without hemodynamically significant stenosis. --No retroperitoneal lymphadenopathy. --No mesenteric lymphadenopathy. --No pelvic or inguinal lymphadenopathy. Reproductive: Unremarkable  Other: There is a small amount of free fluid in the abdomen pelvis. No free air. There is a large retroperitoneal hematoma on the left measuring approximately 10 by 11 by 15 cm. This retroperitoneal hematoma has likely increased in size since the patient's recent CT. Detection of active extravasation is not possible on this study secondary to lack of IV contrast. Musculoskeletal. There are degenerative changes of the lumbar spine. The  patient is status post prior posterior fusion from L5-S1 with an interbody spacer at the L5-S1 level. IMPRESSION: 1. Large retroperitoneal hematoma on the left as measured above. This has likely increased in size since the patient's prior CT dated 10/27/2020. Detection of active bleeding is not possible on this noncontrast study. 2. Small bilateral pleural effusions with adjacent atelectasis. 3. Coarse bilateral airspace opacities concerning for multifocal pneumonia or aspiration. Aortic Atherosclerosis (ICD10-I70.0). Electronically Signed: By: Constance Holster M.D. On: 10/29/2020 16:13   DG CHEST PORT 1 VIEW  Result Date: November 21, 2020 CLINICAL DATA:  Multifocal pneumonia EXAM: PORTABLE CHEST 1 VIEW COMPARISON:  10/30/2020 and prior. FINDINGS: Stable support lines and tubes. No pneumothorax or pleural effusion. Cardiomediastinal silhouette is unchanged. Diffuse interstitial prominence, unchanged. Increased conspicuity of patchy left basilar opacities. IMPRESSION: Unchanged interstitial prominence with increased left basilar opacities. Stable support lines and tubes. Electronically Signed   By: Primitivo Gauze M.D.   On: 11/21/20 08:12   DG CHEST PORT 1 VIEW  Result Date: 10/30/2020 CLINICAL DATA:  Intubation.  Respiratory failure. EXAM: PORTABLE CHEST 1 VIEW COMPARISON:  Chest x-ray 10/29/2020.  CT 10/27/2020. FINDINGS: Endotracheal tube, NG tube, right IJ dual-lumen catheter, left PICC line in stable position. Heart size stable. Diffuse bilateral interstitial  infiltrates again noted. Slight improvement in aeration in the right lung base. No prominent pleural effusion. No pneumothorax. IMPRESSION: 1. Lines and tubes in stable position. 2. Diffuse bilateral interstitial infiltrates again noted. Slight improvement in aeration in the right lung base. Electronically Signed   By: Marcello Moores  Register   On: 10/30/2020 06:54   DG CHEST PORT 1 VIEW  Result Date: 10/29/2020 CLINICAL DATA:  Hypoxia EXAM: PORTABLE CHEST 1 VIEW COMPARISON:  October 27, 2020 FINDINGS: Endotracheal tube tip is 3.2 cm above the carina. Nasogastric tube tip is below the diaphragm with side port at the gastroesophageal junction. Left peripherally inserted central catheter tip is in the superior vena cava. Right jugular catheter tip is in the superior vena cava. No pneumothorax. There is ill-defined opacity in the right base. Lungs elsewhere are clear. Heart size and pulmonary vascularity are normal. No adenopathy. No bone lesions. IMPRESSION: Tube and catheter positions as described without pneumothorax. Note that the nasogastric tube side port is at the gastroesophageal junction. It may be prudent to advance the nasogastric tube 4-5 cm. Ill-defined airspace opacity right base consistent with pneumonia. Lungs elsewhere clear. Cardiac silhouette within normal limits. Electronically Signed   By: Lowella Grip III M.D.   On: 10/29/2020 10:04     Medications:     Scheduled Medications: . sodium chloride   Intravenous Once  . amiodarone  200 mg Per Tube BID  . bethanechol  10 mg Per Tube TID  . chlorhexidine gluconate (MEDLINE KIT)  15 mL Mouth Rinse BID  . Chlorhexidine Gluconate Cloth  6 each Topical Daily  . docusate  100 mg Per Tube BID  . ezetimibe  10 mg Per Tube Daily  . feeding supplement (PROSource TF)  45 mL Per Tube BID  . free water  100 mL Per Tube Q4H  . mouth rinse  15 mL Mouth Rinse 10 times per day  . metoCLOPramide (REGLAN) injection  5 mg Intravenous Q8H  .  pantoprazole sodium  40 mg Per Tube Q1200  . polyethylene glycol  17 g Per Tube Daily  . rosuvastatin  20 mg Per Tube Daily  . sodium chloride flush  10-40 mL Intracatheter Q12H  . sodium chloride flush  10-40  mL Intracatheter Q12H    Infusions: .  prismasol BGK 4/2.5    . sodium chloride Stopped (10/26/20 0828)  . sodium chloride Stopped (10/29/20 0700)  . sodium chloride    . sodium chloride 10 mL/hr at 2020/11/22 0900  . dexmedetomidine (PRECEDEX) IV infusion 0.4 mcg/kg/hr (2020/11/22 0900)  . dextrose 30 mL/hr at 11/22/2020 0900  . meropenem (MERREM) IV Stopped (22-Nov-2020 0724)  . norepinephrine (LEVOPHED) Adult infusion 9 mcg/min (11-22-2020 0900)  . prismasol BGK 4/2.5    . vancomycin      PRN Medications: Place/Maintain arterial line **AND** sodium chloride, sodium chloride, sodium chloride, sodium chloride, acetaminophen (TYLENOL) oral liquid 160 mg/5 mL, alteplase, fentaNYL (SUBLIMAZE) injection, heparin, heparin, heparin, heparin, ipratropium-albuterol, ondansetron **OR** ondansetron (ZOFRAN) IV, sodium chloride flush   Assessment/Plan:   1. Acute Hypoxemic Respiratory Failure - due to PNA and HF - Developed acute respiratory failure on 12/4 and required intubation.  - Extubated 12/8 but required re intubation.  - Completed vanc/meropenem but WBC up trending. Sputum cx now with enterococcus facaelis. Vanc/meropenum restarted 12/21 - We had been working toward extubation but now had setback with RP bleed. Family discussion yesterday and decision made that he would not want trach   2. Shock - Initially sepsis/cardiogenic combined. Now with component of hemorrhagic shock in setting of RP bleed - Echo 12/4 EF with EF 25-30% and WMA. RV normal.   - Echo 12/18 EF 55-60% - Completed vanc/mero on 12/15. Sputum cx now with enterococcus facaelis. Vanc/meropenum restarted 12/21 - Back on NE for BP support   3. Large RP bleed with acute blood loss anemia - developed on 12/18 -  transfused 3 units 12/18 - transfused 2 units 12/19 - CT 12/20 with expanding RP bleed. (no contrast so unable to see ongoing bleeding) - Hgb 12.8 -> 9.5 -> 9.2. Bleed seems to ave slowed/stopped.  4. Acute Systolic Heart Failure  - As above Echo 12/4 EF 25-30% . No previous ECHO. No previous cath.  - Hs Trop [phone].  - Echo 12/18 EF 55-60%.  - RHC 12/6 with low filling pressures after diuresis and low index 1.9. SVR 1467 - Suspect combination of septic and cardiogenic shock. Troponin moderately elevated but flat. Possibility of Tako-tsubo/septic CM has been raised and seems to fit clinically but cannot rule out CAD.  If he recovers and creatinine comes down, cosndier coronary angiography.   5. PAF - in NSR on po amio - off heparin with RP bleed  6. Back Pain--> S/P L5-S1 laminectomy with fusion 09/28/2020  Per Neurosugery  7. AKI - Started HD on 12/15. Continue CVVHD  8. Debility -very weak  suspect component ICU myopathy  9. Partial code - Continue vent support and aggressive care. - No CPR or shocks  GOC discussed with family again yesterday and all in agreement that his QOL was very poor pre-hospitalization and he would not want this level of support without significant improvement in his condition. Likely terminal extubation today.   CRITICAL CARE Performed by: Glori Bickers  Total critical care time: 45 minutes  Critical care time was exclusive of separately billable procedures and treating other patients.  Critical care was necessary to treat or prevent imminent or life-threatening deterioration.  Critical care was time spent personally by me (independent of midlevel providers or residents) on the following activities: development of treatment plan with patient and/or surrogate as well as nursing, discussions with consultants, evaluation of patient's response to treatment, examination of patient, obtaining history from patient  or surrogate, ordering and  performing treatments and interventions, ordering and review of laboratory studies, ordering and review of radiographic studies, pulse oximetry and re-evaluation of patient's condition.   Length of Stay: 21  Glori Bickers, MD  9:20 AM

## 2020-11-10 DEATH — deceased

## 2021-11-19 IMAGING — US US EXTREM LOW VENOUS*R*
1 series · 13 of 24 positions shown · non-contrast
Comparison: None.

CLINICAL DATA: Right lateral calf pain for the past 3 weeks.
Evaluate for DVT.



[Series 1: us extrem low venous*right* · 0.06mm/px · 13 of 64 slices shown]
[im 1/64]
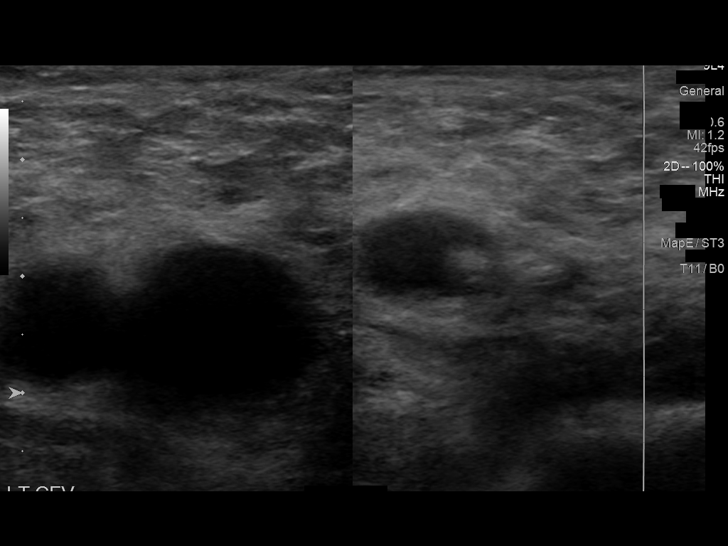
[im 6/64]
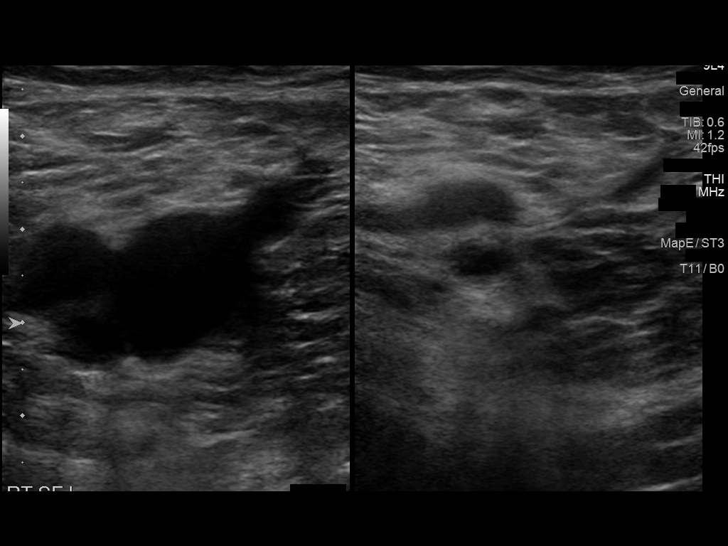
[im 11/64]
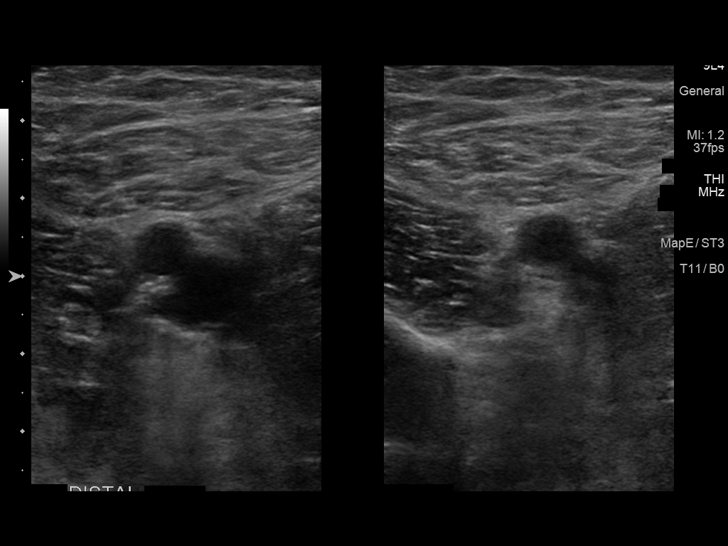
[im 17/64]
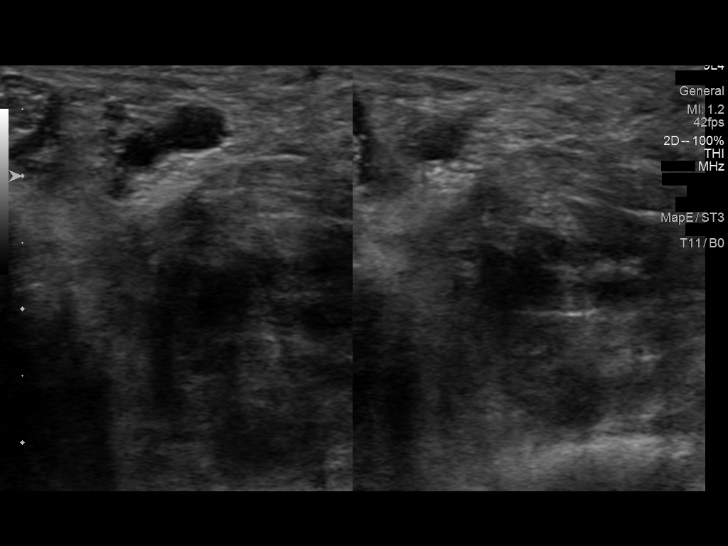
[im 22/64]
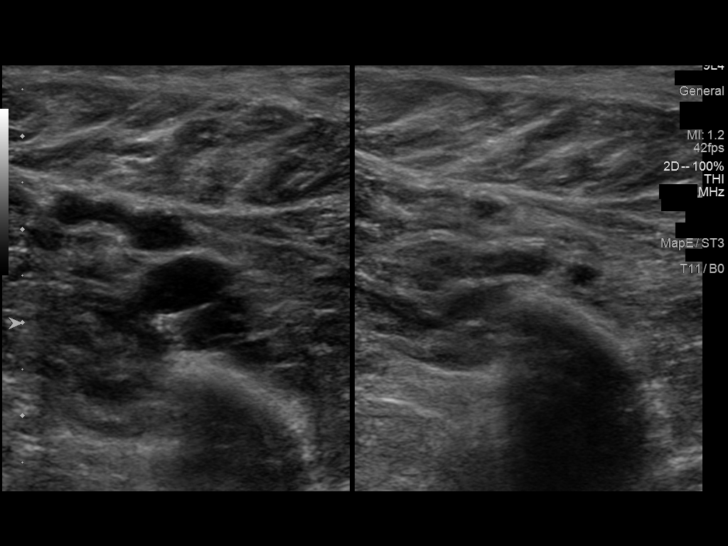
[im 28/64]
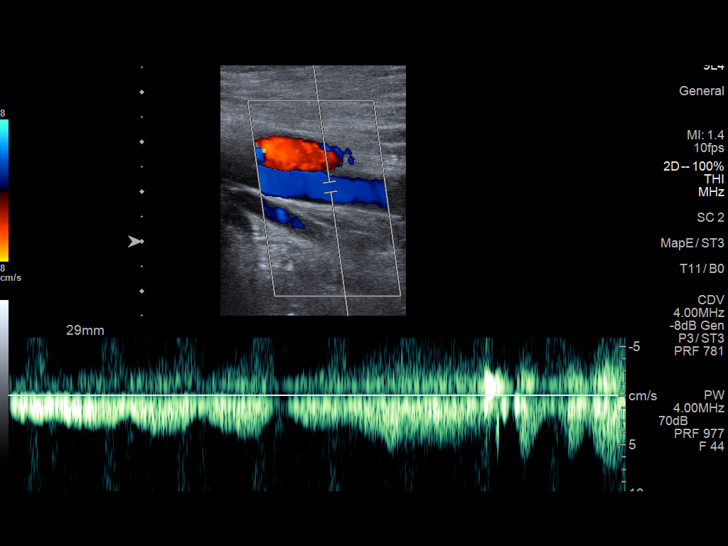
[im 33/64]
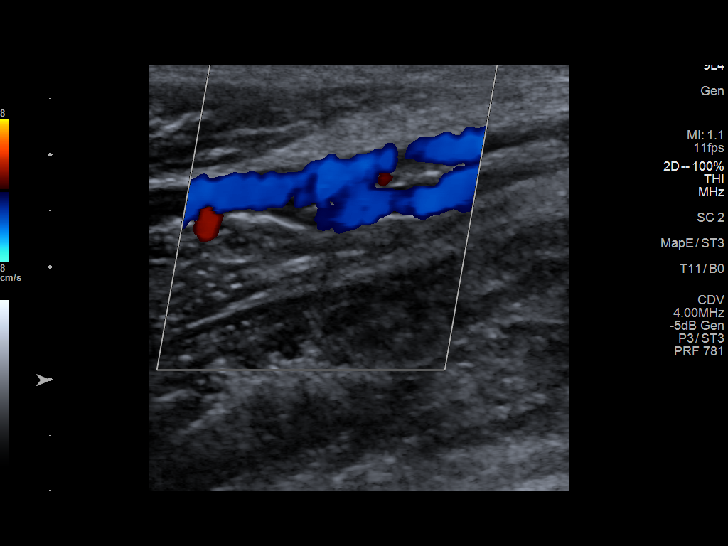
[im 36/64]
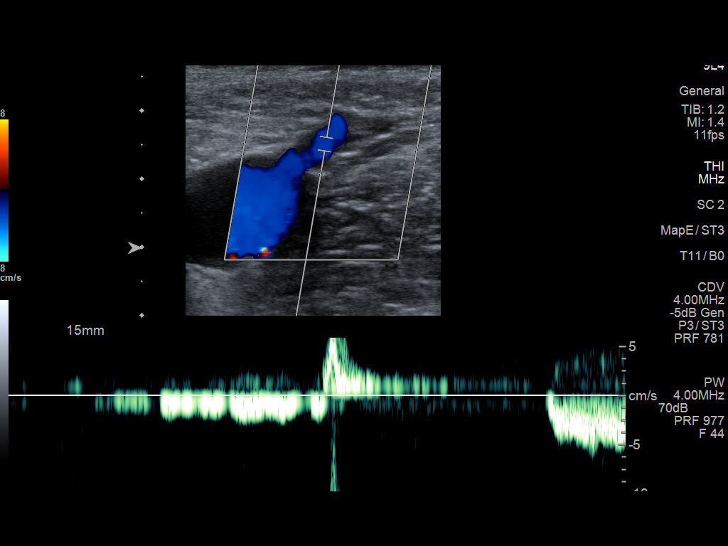
[im 42/64]
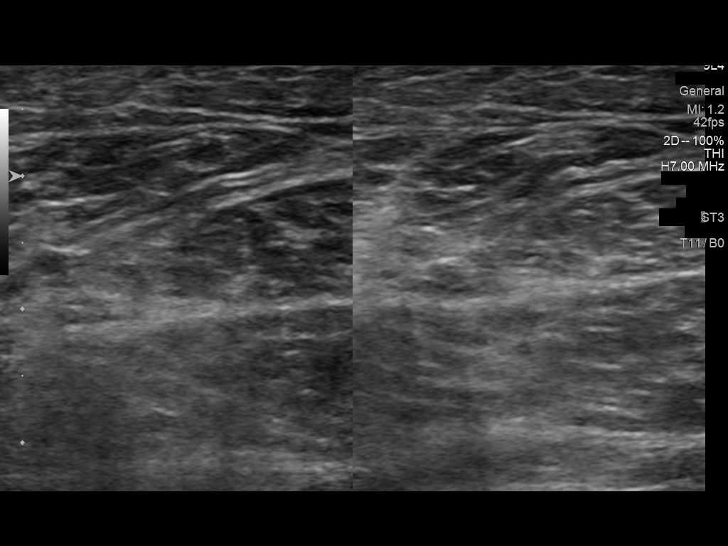
[im 47/64]
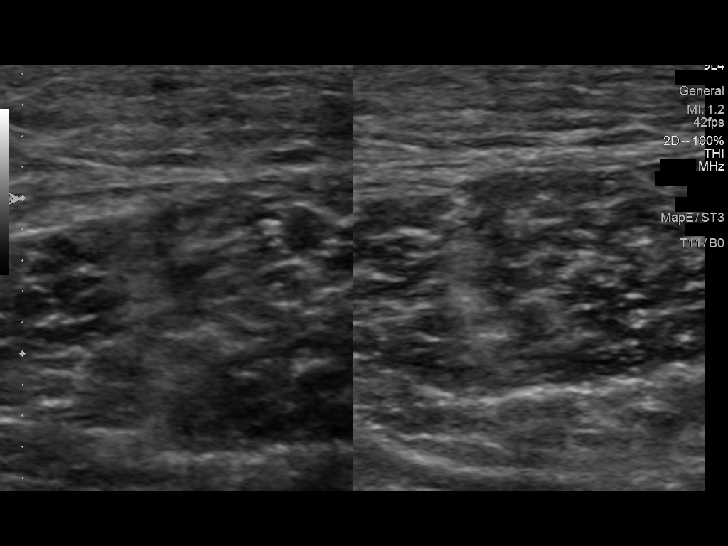
[im 53/64]
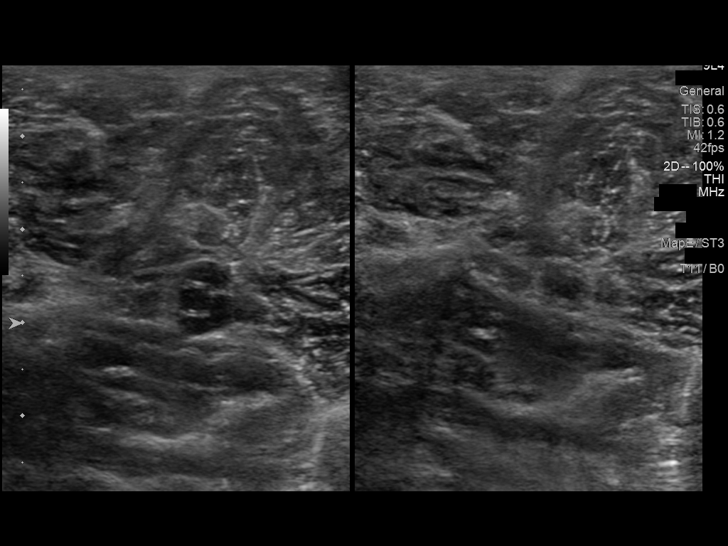
[im 58/64]
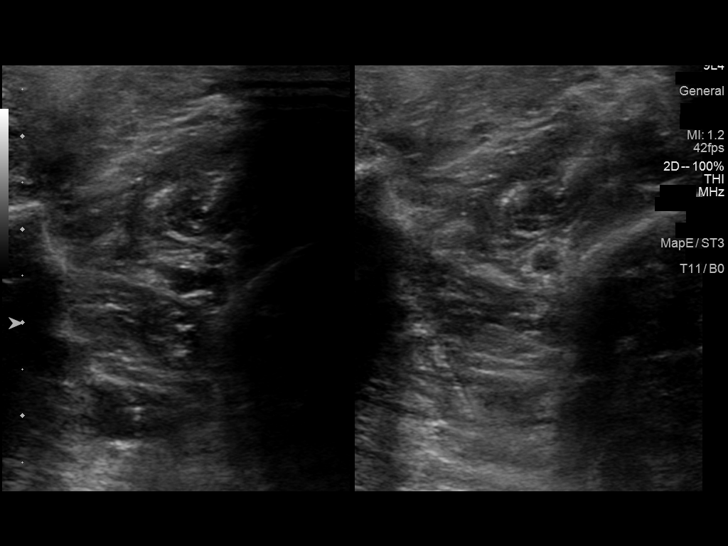
[im 64/64]
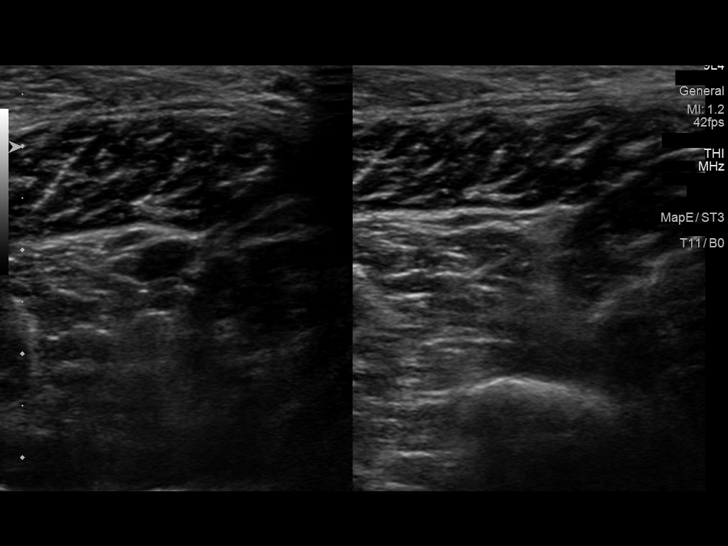

[13 of 24 positions shown; findings below may reference images not displayed]

FINDINGS: Contralateral Common Femoral Vein: Respiratory phasicity is normal
and symmetric with the symptomatic side. No evidence of thrombus.
Normal compressibility.

Common Femoral Vein: No evidence of thrombus. Normal
compressibility, respiratory phasicity and response to augmentation.

Saphenofemoral Junction: No evidence of thrombus. Normal
compressibility and flow on color Doppler imaging.

Profunda Femoral Vein: No evidence of thrombus. Normal
compressibility and flow on color Doppler imaging.

Femoral Vein: No evidence of thrombus. Normal compressibility,
respiratory phasicity and response to augmentation.

Popliteal Vein: No evidence of thrombus. Normal compressibility,
respiratory phasicity and response to augmentation.

Calf Veins: No evidence of thrombus. Normal compressibility and flow
on color Doppler imaging.

Superficial Great Saphenous Vein: No evidence of thrombus. Normal
compressibility.

Venous Reflux:  None.

Other Findings:  None.
IMPRESSION: No evidence of DVT within the right lower extremity.

## 2022-06-16 IMAGING — MR MR LUMBAR SPINE WO/W CM
5 of 7 series · 32 of 48 positions shown · IV contrast (gadavist)
Comparison: Lumbar spine MRI 05/23/2020

CLINICAL DATA: Low back pain radiating to the right foot

EXAM:
MRI LUMBAR SPINE WITHOUT AND WITH CONTRAST
TECHNIQUE: Multiplanar and multiecho pulse sequences of the lumbar spine were
obtained without and with intravenous contrast.
CONTRAST:  5mL GADAVIST GADOBUTROL 1 MMOL/ML IV SOLN

[Series 9: T2 · sagittal · 4.0mm · 0.69mm/px · 6 of 17 slices shown (1 of 2)]
[im 1/17]
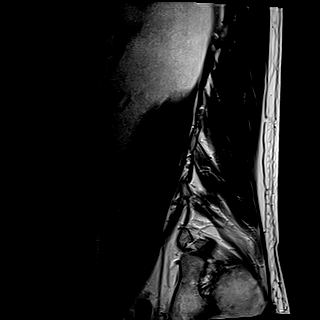
[im 4/17]
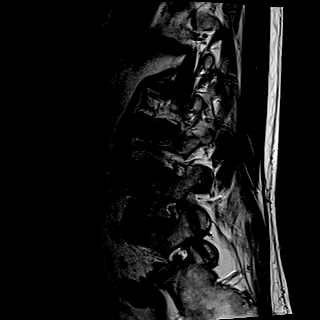
[im 7/17]
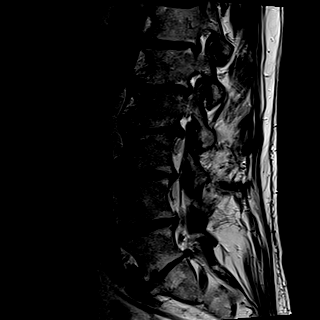
[im 10/17]
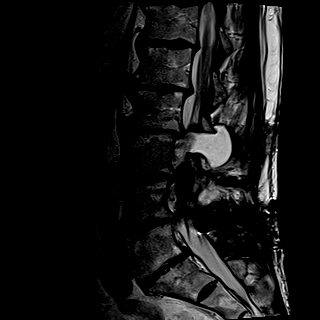
[im 13/17]
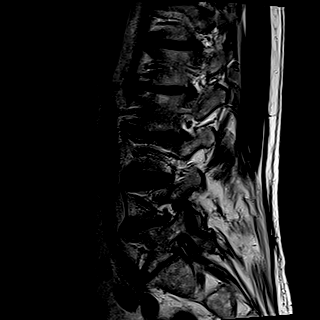
[im 17/17]
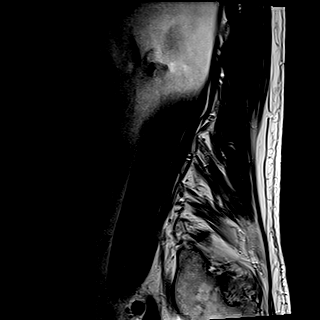

[Series 10: T1 · sagittal · 4.0mm · 0.69mm/px · 5 of 17 slices shown (1 of 2)]
[im 1/17]
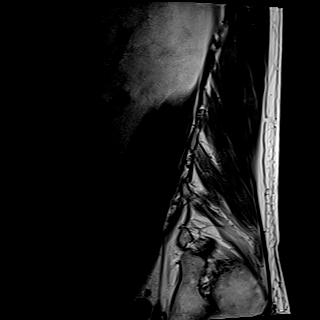
[im 5/17]
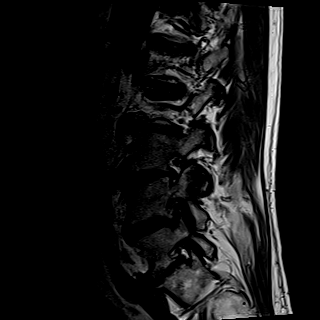
[im 9/17]
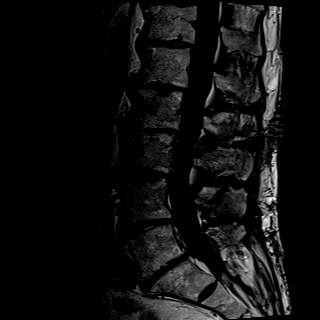
[im 13/17]
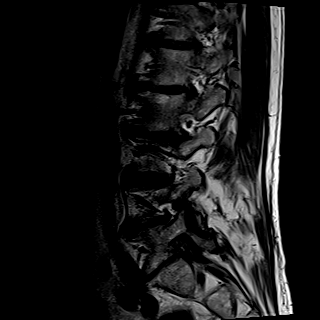
[im 17/17]
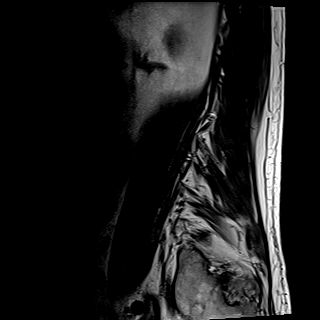

[Series 12: T2 · axial · 4.0mm · 0.62mm/px · z∈[-36,+125]mm · 9 of 31 slices shown (2 of 2)]
[im 1/31]
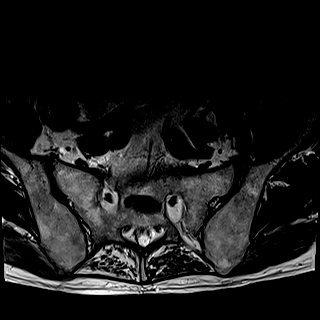
[im 4/31]
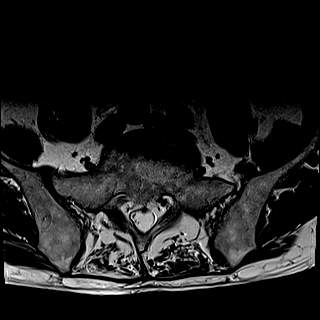
[im 8/31]
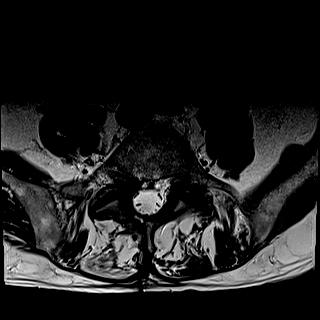
[im 12/31]
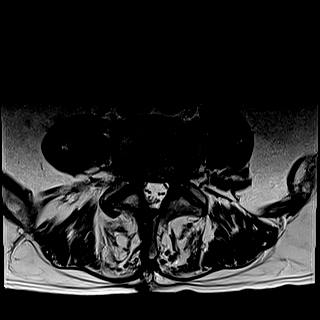
[im 16/31]
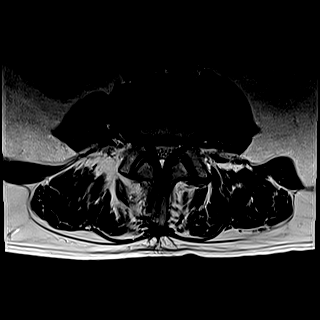
[im 19/31]
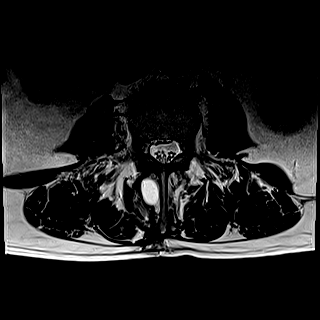
[im 23/31]
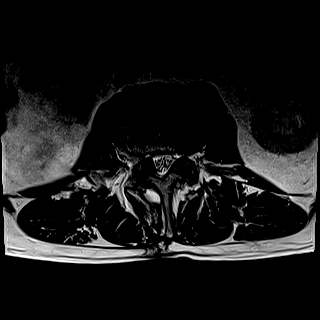
[im 27/31]
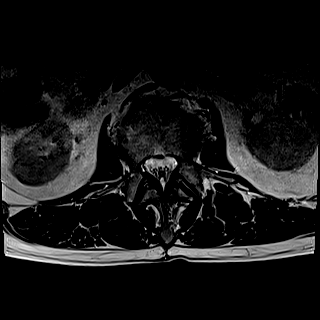
[im 31/31]
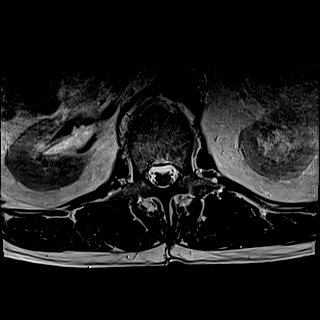

[Series 13: T1 · axial · 4.0mm · 0.39mm/px · z∈[-36,+125]mm · 9 of 31 slices shown (2 of 2)]
[im 1/31]
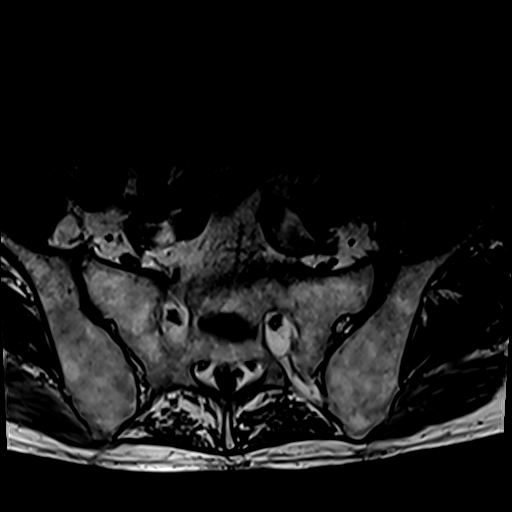
[im 4/31]
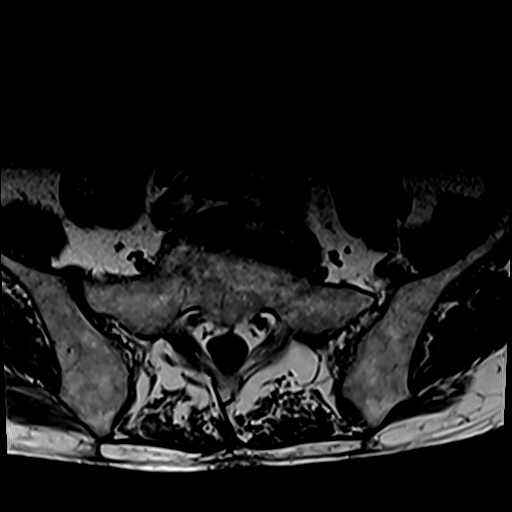
[im 8/31]
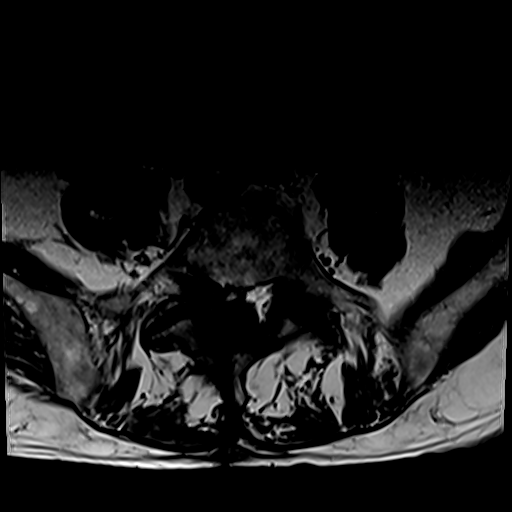
[im 12/31]
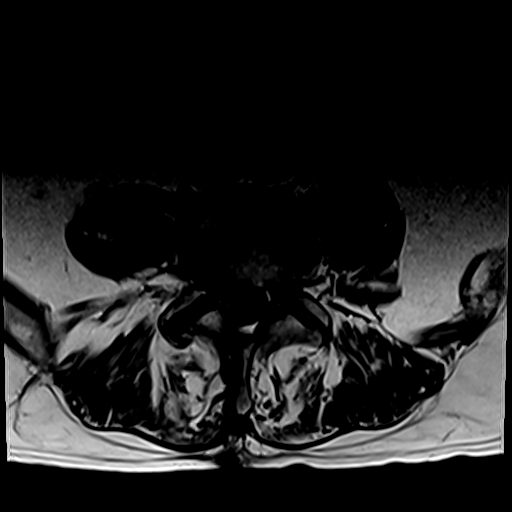
[im 16/31]
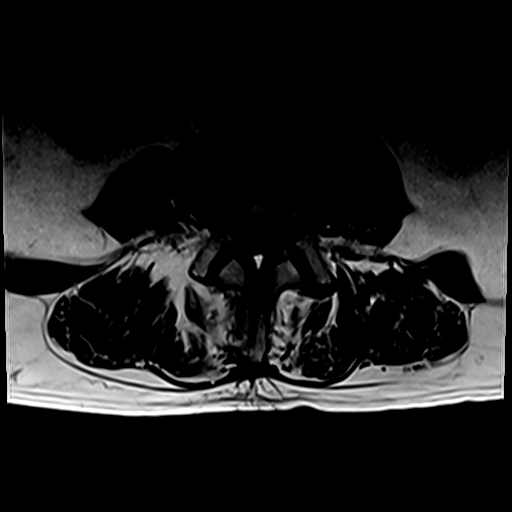
[im 19/31]
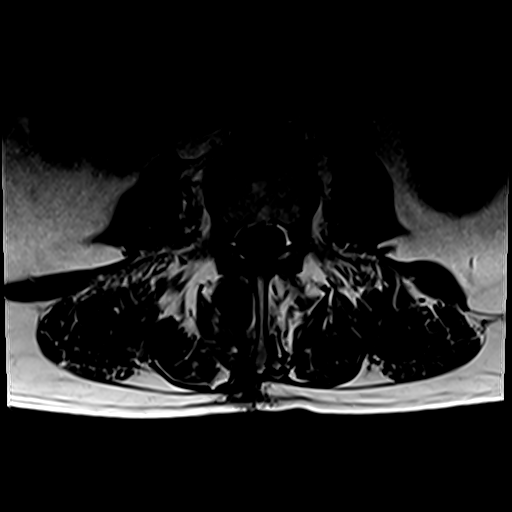
[im 23/31]
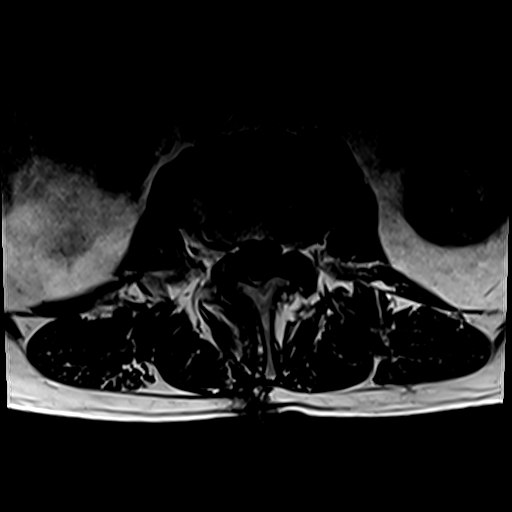
[im 27/31]
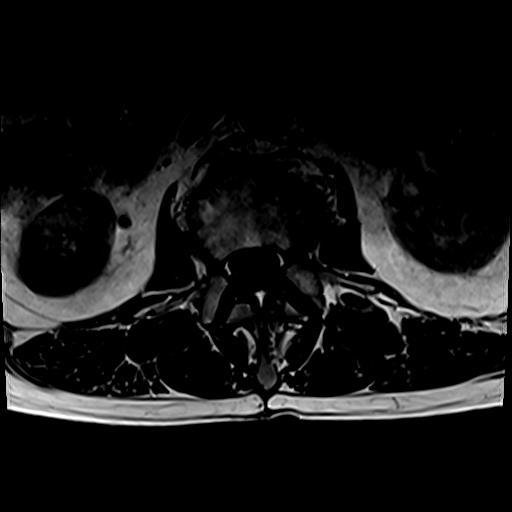
[im 31/31]
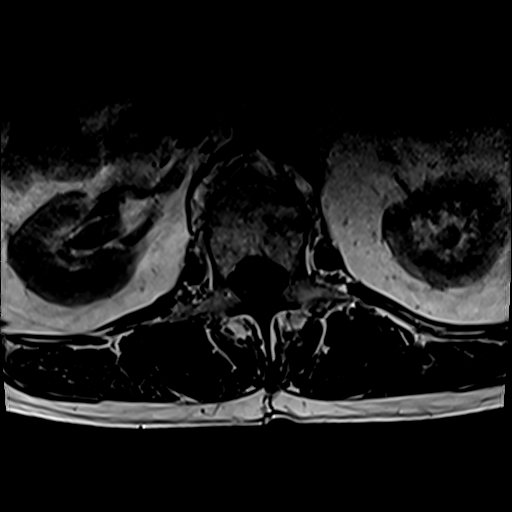

[Series 14: T1 fat-sat post-contrast · sagittal · 4.0mm · 0.69mm/px · 3 of 17 slices shown]
[im 1/17]
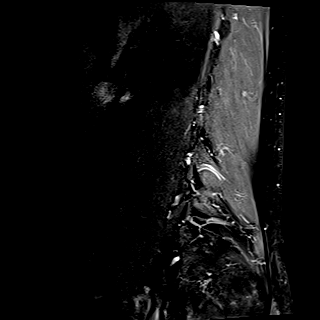
[im 5/17]
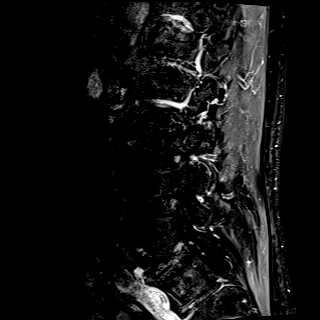
[im 9/17]
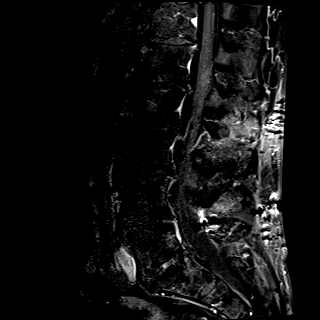

[32 of 48 positions shown; findings below may reference images not displayed]

FINDINGS: Segmentation:  Standard

Alignment:  Grade 1 retrolisthesis at L2-3 and L3-4

Vertebrae:  No fracture, evidence of discitis, or bone lesion.

Conus medullaris and cauda equina: Conus extends to the L2 level.
Conus and cauda equina appear normal.

Paraspinal and other soft tissues: Negative.

Disc levels:

T12-L1: Small disc bulge without spinal canal or neural foraminal
stenosis.

L1-L2: Small disc bulge with disc space narrowing. Normal facets. No
spinal canal stenosis. No neural foraminal stenosis.

L2-L3: Right laminectomy with fluid in the laminectomy bed. No
residual spinal canal stenosis. Unchanged mild left neural foraminal
stenosis.

L3-L4: Small disc bulge. Right lateral recess narrowing without
central spinal canal stenosis. No neural foraminal stenosis.

L4-L5: Small disc bulge with moderate right facet hypertrophy. No
spinal canal stenosis. Moderate bilateral neural foraminal stenosis
is unchanged.

L5-S1: Severe facet hypertrophy with disc space narrowing and small
bulge. No spinal canal stenosis. Severe right and moderate left
neural foraminal stenosis. There is enhancing granulation tissue
adjacent to the right L5 nerve root. Unchanged.

Visualized sacrum: Normal.
IMPRESSION: 1. Status post right L2-3 laminectomy with fluid in the laminectomy
bed, consistent with seroma. No residual spinal canal stenosis.
2. Unchanged multilevel moderate-to-severe neural foraminal
stenosis, worst at right L5-S1 where there is enhancing granulation
tissue adjacent to the L5 nerve root in the neural foramen.
